# Patient Record
Sex: Female | Born: 1945 | Race: Black or African American | Hispanic: No
Health system: Midwestern US, Community
[De-identification: ages and names within clinical notes are randomized; demographics above are authoritative.]

## PROBLEM LIST (undated history)

## (undated) DIAGNOSIS — E785 Hyperlipidemia, unspecified: Secondary | ICD-10-CM

## (undated) DIAGNOSIS — Z800 Family history of malignant neoplasm of digestive organs: Secondary | ICD-10-CM

## (undated) DIAGNOSIS — K219 Gastro-esophageal reflux disease without esophagitis: Secondary | ICD-10-CM

## (undated) DIAGNOSIS — C50919 Malignant neoplasm of unspecified site of unspecified female breast: Secondary | ICD-10-CM

## (undated) DIAGNOSIS — D709 Neutropenia, unspecified: Secondary | ICD-10-CM

## (undated) DIAGNOSIS — C801 Malignant (primary) neoplasm, unspecified: Secondary | ICD-10-CM

## (undated) DIAGNOSIS — J302 Other seasonal allergic rhinitis: Secondary | ICD-10-CM

## (undated) DIAGNOSIS — T7840XA Allergy, unspecified, initial encounter: Secondary | ICD-10-CM

## (undated) DIAGNOSIS — D72819 Decreased white blood cell count, unspecified: Secondary | ICD-10-CM

## (undated) DIAGNOSIS — Z803 Family history of malignant neoplasm of breast: Secondary | ICD-10-CM

## (undated) DIAGNOSIS — J189 Pneumonia, unspecified organism: Secondary | ICD-10-CM

## (undated) DIAGNOSIS — I10 Essential (primary) hypertension: Secondary | ICD-10-CM

## (undated) DIAGNOSIS — Z780 Asymptomatic menopausal state: Secondary | ICD-10-CM

## (undated) DIAGNOSIS — R519 Headache, unspecified: Secondary | ICD-10-CM

## (undated) DIAGNOSIS — R079 Chest pain, unspecified: Secondary | ICD-10-CM

## (undated) DIAGNOSIS — H9042 Sensorineural hearing loss, unilateral, left ear, with unrestricted hearing on the contralateral side: Secondary | ICD-10-CM

## (undated) DIAGNOSIS — Z13820 Encounter for screening for osteoporosis: Secondary | ICD-10-CM

## (undated) DIAGNOSIS — Z01419 Encounter for gynecological examination (general) (routine) without abnormal findings: Secondary | ICD-10-CM

## (undated) DIAGNOSIS — R0602 Shortness of breath: Secondary | ICD-10-CM

## (undated) HISTORY — DX: Allergy, unspecified, initial encounter: T78.40XA

## (undated) HISTORY — PX: AUGMENTATION MAMMAPLASTY: SUR837

## (undated) HISTORY — DX: Essential (primary) hypertension: I10

## (undated) HISTORY — DX: Hyperlipidemia, unspecified: E78.5

## (undated) HISTORY — DX: Family history of malignant neoplasm of breast: Z80.3

## (undated) HISTORY — PX: COLONOSCOPY: SHX174

## (undated) HISTORY — DX: Gastro-esophageal reflux disease without esophagitis: K21.9

## (undated) HISTORY — PX: POLYPECTOMY: SHX149

## (undated) HISTORY — DX: Other seasonal allergic rhinitis: J30.2

## (undated) HISTORY — DX: Family history of malignant neoplasm of digestive organs: Z80.0

---

## 1971-12-06 HISTORY — PX: TUBAL LIGATION: SHX77

## 1979-12-06 HISTORY — PX: OTHER SURGICAL HISTORY: SHX169
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
## 2000-08-28 NOTE — Procedures (Signed)
[facility]                          STRESS ECHOCARDIOGRAM REPORT   NAME:    Kirsten Day, Kirsten Day                           SS#:        [id number]   60   DOB:     03/03/1946                                   AGE:        53   SEX:     F                                            ROOM#:      [id number]   MR#:     [id number]                                     DATE:       09/25/200   1   REFERRING PHYS:K. FrumkinTAPE/INDEX:                  [id number]/6670   PRETEST DATA:   INDICATION:  Chest pain   MEDS TAKEN:  --   MEDS HELD:  --   TARGET HEART RATE:  167     85%:  141   RISK FACTORS: --   BASELINE ECG:  Normal sinus rhythm; poor R wave progression   EXERCISE SUPERVISED BY:   TEST RESULTS:           BRUCE PROTOCOL     STAGE    SPEED (MPH)   GRADE (%)   TIME (MIN:SEC)      HR       BP   Resting                                                  74     104/72       1          1.7           10           3:00          102     126/76       2          2.5           12           3:00          130     162/82       3          3.4           14           1:00          153     ---/--    Recovery                                 Immediate        --     ---/--                                             2:00           74     156/76                                             4:00           74     142/72                                             6:00           69     130/80   REASON FOR STOPPING:  Achieved target HR        TOTAL EXERCISE TIME:  7:00   ACHIEVED HEART RATE:  153 (92%  max HR)         EST. METS:  8.5   HR RESPONSE:  Normal                            PEAK RPP:  24,786   BP RESPONSE:  Normal                            CHEST PAIN:  None   OBSERVED DYSRHYTHMIAS:  PAC   ST SEGMENT CHANGES:  J point depression with insignificant rapidly   upsloping ST segments                               WALL MOTION ANALYSIS   LV WALL SEGMENT              PRE-EXERCISE            POST-EXERCISE    Basal Anteroseptal              Normal                Hyperkinesis   Basal Septal                    Normal                Hyperkinesis   Basal Inferoseptal              Normal                Hyperkinesis   Basal Posterior                 Normal                Hyperkinesis   Basal Lateral                   Normal                Hyperkinesis   Basal Anterior                    Normal                Hyperkinesis   Mid Anteroseptal                Normal                Hyperkinesis   Mid Septal                      Normal                Hyperkinesis   Mid Inferoseptal                Normal                Hyperkinesis   Mid Posterior                   Normal                Hyperkinesis   Mid Lateral                     Normal                Hyperkinesis   Mid Anterior                    Normal                Hyperkinesis   Apical Septal                   Normal                Hyperkinesis   Apical Inferior                 Normal                Hyperkinesis   Apical Lateral                  Normal                Hyperkinesis   Apical Anterior                 Normal                Hyperkinesis   LV  Chamber Size                                        Smaller   ECG INTERPRETATION:  Negative by ECG criteria.   ECHO INTERPRETATION:  Normal.   OVERALL IMPRESSION:  Negative for ischemia.

## 2000-08-28 NOTE — ED Provider Notes (Signed)
[facility]                      EMERGENCY DEPARTMENT TREATMENT REPORT   NAME:  NETTY, SULLIVANT   MR #:  [id number]   BILLING #: [id number]        DOS: 08/28/2000  TIME: 2:46 P   cc:   Primary Physician:   CHIEF COMPLAINT:   Chest pain.   HISTORY OF PRESENT ILLNESS:  [age]-year-old woman has had mid sternal   chest tightness on and off for about a week.  It is occasionally worse with   certain positions, occasionally worse supine, it is not particularly   exertional.  It is mid sternal and radiates to both sides of her chest,   occasionally goes to her lower abdomen.  There is some shortness of breath   with it but no diaphoresis, it is not worse when she breathes or moves.  It   is similar to her reflux that she has had in the past.   REVIEW OF SYSTEMS:   CONSTITUTIONAL:  No fever, chills, or weight loss.  She has had some   exertional fatigue.   ENT:  She also has some tightness in her throat with this.   RESPIRATORY:  Above.   CARDIOVASCULAR:    Above.   GASTROINTESTINAL:   Has had some diarrhea.   GENITOURINARY:  No dysuria, frequency, or urgency.   MUSCULOSKELETAL:   No long trips, no leg pain.   Denies complaints in any other system.   PAST MEDICAL HISTORY:   She has reflux, she had a stress test about three   years ago which was for similar symptoms which was negative.   FAMILY HISTORY:  Negative for coronary disease, negative for premature   coronary disease, positive for coronary disease.   SOCIAL HISTORY:  Negative for current tobacco or alcohol use.   ALLERGIES:  None.   MEDICATIONS:   Claritin, Prilosec and birth control pills.   PHYSICAL EXAMINATION:  153/83, pulse 71, respirations 20, temperature 99.1.   GENERAL:  Thin, healthy appearing 54 year old woman in no apparent   distress.   HEENT:  Pupils equal and reactive.  Extra-ocular movements normal. Nose and   throat unremarkable.   NECK:  Supple, no significant cervical lymphadenopathy, no JVD.    LUNGS:  Clear and equal BS, no respiratory distress or tachypnea.   HEART:  Regular, without significant murmurs, gallops or rubs.   ABDOMEN:  Soft, nontender, without complaint of pain to palpation.   EXTREMITIES:  Calves soft and nontender.  No peripheral edema. Pulses   satisfactory.   NEUROLOGICAL:  Cranial nerves, deep tendon reflexes, strength and light   touch sensation WNL.   CHEST WALL:   Non tender.   IMPRESSION/PLAN:  Patient with chest pain.  Acute ischemic coronary disease   must be considered first, and the patient protected against the   consequences of same, while other etiologies (including infectious,   metabolic, pulmonary, gastrointestinal, and musculoskeletal) are   considered.   COURSE IN EMERGENCY DEPARTMENT:  Improved after a total of two sublingual   nitroglycerin  and the application of an inch of paste.  She still had some   discomfort left.   DIAGNOSTIC EVALUATION:   Cardiac enzymes were normal including a CPK of 60,   an MB of 0.3, troponin is 0, myoglobin normal at 29.4.  CMP unremarkable,   albumin a little low at 3.5.  Electrocardiogram nonspecific ST-T wave  changes. White count 3,000, hemoglobin 14, platelets 260,000.  Chest x-ray   no acute disease by the radiologist.   DIAGNOSTIC IMPRESSION:   1.  Acute chest pain:  Unstable angina versus myocardial infarction versus      GI in origin.   DISPOSITION:   The initial Emergency Department evaluation of this patient   appears to be negative for evidence of an acute, ongoing coronary ischemia   requiring hospital admission or urgent intervention.  However, ischemic   coronary disease has not been eliminated as a consideration.  Consequently,   the patient will be assigned to the Outpatient Center for serial cardiac   enzymes and, if these are negative, resting and stress echocardiography or   other additional diagnostic testing.  Discussed with Dr. Bertram Gala who is   covering for Dr. Martie Round who concurs.   Electronically Signed By:    Shanna Cisco, M.D. 08/28/2000 18:17   ____________________________   Shanna Cisco, M.D.   Xaver.Mink D:  08/28/2000 T:  08/28/2000  2:47 P   [id number]

## 2000-08-28 NOTE — Discharge Summary (Signed)
[facility]                       OUTPATIENT CENTER DISCHARGE SUMMARY   NAME:Day, Kirsten   MR#:  [id number]     BILLING #: [id number]     DOS: 08/28/2000  DOD:08/29/2000   TIME: 11:24 A   cc:   Primary Physician:   Arlester Marker, M.D.   DATE OF ASSIGNMENT:  1525 hours on 08/28/00.   DATE OF DISCHARGE:  1130 hours on 08/29/00.   ADMISSION DIAGNOSIS:   1.  Acute chest pain, unstable angina versus myocardial infarction versus      gastrointestinal.   DISCHARGE DIAGNOSIS:   1.  Acute chest pain, cardiac unlikely, gastrointestinal more likely.   HISTORY OF PRESENT ILLNESS:  [age]-year-old woman with known GERD   with midsternal chest discomfort on and off for about a week, tightness in   nature.  Seen by me in the Emergency Department.  The Emergency Department   evaluation was unrevealing and the patient was assigned to the outpatient   center for serial cardiac enzymes and stress echocardiography.   COURSE IN THE OUTPATIENT CENTER:  The patient remained comfortable, vital   signs normal.  The patient was assigned to the Outpatient Center for serial   cardiac enzymes and resting and stress echocardiography.  Results of serum   myoglobin at 0 and 3 hours, and serum CPK and MB at 0, 6, and 9 hours gave   no indication of acute myocardial damage.  The patient then underwent   resting and exercise 2-D echocardiography and exercise test under the   supervision of [facility].  Their final impression was of   "No CAD, OK for Discharge."   PHYSICAL EXAMINATION:   Examination at discharge.   GENERAL:  Asymptomatic.   LUNGS:  Clear.   HEART:  Normal.   ABDOMEN:  Nontender.   DISPOSITION:  The probabilistic nature of cardiac diagnostic testing was   explained.  The patient was counseled to seek further cardiac evaluation   should symptoms worsen or persist without another diagnosis being found.   Advised liquid antacids, prescription Carafate #36 with one refill,  follow   up her physician for continued evaluation.  The patient is discharged with   verbal and written   instructions and a referral for ongoing care.  The patient is aware that   they may return at any time for new or worsening symptoms.   Electronically Signed By:   Shanna Cisco, M.D. 09/02/2000 04:13   ____________________________   Shanna Cisco, M.D.   cd D:  08/29/2000 T:  09/01/2000  3:32 P   [id number]

## 2000-08-28 NOTE — Procedures (Signed)
[facility]                          STRESS ECHOCARDIOGRAM REPORT   NAME:    Kirsten Day, Kirsten Day                           SS#:        [id number]   60   DOB:     [birth year]                                   AGE:        53   SEX:     F                                            ROOM#:      [id number]   MR#:     [id number]                                     DATE:       09/25/200   1   REFERRING PHYS:K. FrumkinTAPE/INDEX:                  [id number]   PRETEST DATA:   INDICATION:  Chest pain   MEDS TAKEN:  --   MEDS HELD:  --   TARGET HEART RATE:  167     85%:  141   RISK FACTORS: --   BASELINE ECG:  Normal sinus rhythm; poor R wave progression   EXERCISE SUPERVISED BY:   TEST RESULTS:           BRUCE PROTOCOL     STAGE    SPEED (MPH)   GRADE (%)   TIME (MIN:SEC)      HR       BP   Resting                                                  74     104/72       1          1.7           10           3:00          102     126/76       2          2.5           12           3:00          130     162/82       3          3.4           14           1:00          153     ---/--    Recovery  Immediate        --     ---/--                                             2:00           74     156/76                                             4:00           74     142/72                                             6:00           69     130/80   REASON FOR STOPPING:  Achieved target HR        TOTAL EXERCISE TIME:  7:00   ACHIEVED HEART RATE:  153 (92%  max HR)         EST. METS:  8.5   HR RESPONSE:  Normal                            PEAK RPP:  24,786   BP RESPONSE:  Normal                            CHEST PAIN:  None   OBSERVED DYSRHYTHMIAS:  PAC   ST SEGMENT CHANGES:  J point depression with insignificant rapidly   upsloping ST segments                               WALL MOTION ANALYSIS   LV WALL SEGMENT              PRE-EXERCISE            POST-EXERCISE    Basal Anteroseptal              Normal                Hyperkinesis   Basal Septal                    Normal                Hyperkinesis   Basal Inferoseptal              Normal                Hyperkinesis   Basal Posterior                 Normal                Hyperkinesis   Basal Lateral                   Normal                Hyperkinesis   Basal Anterior  Normal                Hyperkinesis   Mid Anteroseptal                Normal                Hyperkinesis   Mid Septal                      Normal                Hyperkinesis   Mid Inferoseptal                Normal                Hyperkinesis   Mid Posterior                   Normal                Hyperkinesis   Mid Lateral                     Normal                Hyperkinesis   Mid Anterior                    Normal                Hyperkinesis   Apical Septal                   Normal                Hyperkinesis   Apical Inferior                 Normal                Hyperkinesis   Apical Lateral                  Normal                Hyperkinesis   Apical Anterior                 Normal                Hyperkinesis   LV  Chamber Size                                        Smaller   ECG INTERPRETATION:  Negative by ECG criteria.   ECHO INTERPRETATION:  Normal.   OVERALL IMPRESSION:  Negative for ischemia.

## 2000-08-30 NOTE — Procedures (Signed)
[facility]                          STRESS ECHOCARDIOGRAM REPORT   NAME:    Kirsten Day, Kirsten Day                           SS#:        [id number]   60   DOB:     [birth year]                                   AGE:        53   SEX:     F                                            ROOM#:      [id number]   MR#:     [id number]                                     DATE:       09/25/200   1   REFERRING PHYS:K. FrumkinTAPE/INDEX:                  150/6670   PRETEST DATA:   INDICATION:  Chest pain   MEDS TAKEN:  --   MEDS HELD:  --   TARGET HEART RATE:  167     85%:  141   RISK FACTORS: --   BASELINE ECG:  Normal sinus rhythm; poor R wave progression   EXERCISE SUPERVISED BY:   TEST RESULTS:           BRUCE PROTOCOL     STAGE    SPEED (MPH)   GRADE (%)   TIME (MIN:SEC)      HR       BP   Resting                                                  74     104/72       1          1.7           10           3:00          102     126/76       2          2.5           12           3:00          130     162/82       3          3.4           14           1:00          153     ---/--    Recovery  Immediate        --     ---/--                                             2:00           74     156/76                                             4:00           74     142/72                                             6:00           69     130/80   REASON FOR STOPPING:  Achieved target HR        TOTAL EXERCISE TIME:  7:00   ACHIEVED HEART RATE:  153 (92%  max HR)         EST. METS:  8.5   HR RESPONSE:  Normal                            PEAK RPP:  24,786   BP RESPONSE:  Normal                            CHEST PAIN:  None   OBSERVED DYSRHYTHMIAS:  PAC   ST SEGMENT CHANGES:  J point depression with insignificant rapidly   upsloping ST segments                               WALL MOTION ANALYSIS   LV WALL SEGMENT              PRE-EXERCISE            POST-EXERCISE   Basal Anteroseptal               Normal                Hyperkinesis   Basal Septal                    Normal                Hyperkinesis   Basal Inferoseptal              Normal                Hyperkinesis   Basal Posterior                 Normal                Hyperkinesis   Basal Lateral                   Normal                Hyperkinesis   Basal Anterior  Normal                Hyperkinesis   Mid Anteroseptal                Normal                Hyperkinesis   Mid Septal                      Normal                Hyperkinesis   Mid Inferoseptal                Normal                Hyperkinesis   Mid Posterior                   Normal                Hyperkinesis   Mid Lateral                     Normal                Hyperkinesis   Mid Anterior                    Normal                Hyperkinesis   Apical Septal                   Normal                Hyperkinesis   Apical Inferior                 Normal                Hyperkinesis   Apical Lateral                  Normal                Hyperkinesis   Apical Anterior                 Normal                Hyperkinesis   LV  Chamber Size                                        Smaller   ECG INTERPRETATION:  Negative by ECG criteria.   ECHO INTERPRETATION:  Normal.   OVERALL IMPRESSION:  Negative for ischemia.

## 2000-08-30 NOTE — Procedures (Signed)
[facility]                          STRESS ECHOCARDIOGRAM REPORT   NAME:    Kirsten Day, Kirsten Day                           SS#:        [id number]   60   DOB:     12/10/1945                                   AGE:        53   SEX:     F                                            ROOM#:      [id number]   MR#:     [id number]                                     DATE:       09/25/200   1   REFERRING PHYS:K. FrumkinTAPE/INDEX:                  150/6670   PRETEST DATA:   INDICATION:  Chest pain   MEDS TAKEN:  --   MEDS HELD:  --   TARGET HEART RATE:  167     85%:  141   RISK FACTORS: --   BASELINE ECG:  Normal sinus rhythm; poor R wave progression   EXERCISE SUPERVISED BY:   TEST RESULTS:           BRUCE PROTOCOL     STAGE    SPEED (MPH)   GRADE (%)   TIME (MIN:SEC)      HR       BP   Resting                                                  74     104/72       1          1.7           10           3:00          102     126/76       2          2.5           12           3:00          130     162/82       3          3.4           14           1:00          153     ---/--    Recovery                                 Immediate        --     ---/--                                             2:00           74     156/76                                             4:00           74     142/72                                             6:00           69     130/80   REASON FOR STOPPING:  Achieved target HR        TOTAL EXERCISE TIME:  7:00   ACHIEVED HEART RATE:  153 (92%  max HR)         EST. METS:  8.5   HR RESPONSE:  Normal                            PEAK RPP:  24,786   BP RESPONSE:  Normal                            CHEST PAIN:  None   OBSERVED DYSRHYTHMIAS:  PAC   ST SEGMENT CHANGES:  J point depression with insignificant rapidly   upsloping ST segments                               WALL MOTION ANALYSIS   LV WALL SEGMENT              PRE-EXERCISE            POST-EXERCISE   Basal Anteroseptal               Normal                Hyperkinesis   Basal Septal                    Normal                Hyperkinesis   Basal Inferoseptal              Normal                Hyperkinesis   Basal Posterior                 Normal                Hyperkinesis   Basal Lateral                   Normal                Hyperkinesis   Basal Anterior                    Normal                Hyperkinesis   Mid Anteroseptal                Normal                Hyperkinesis   Mid Septal                      Normal                Hyperkinesis   Mid Inferoseptal                Normal                Hyperkinesis   Mid Posterior                   Normal                Hyperkinesis   Mid Lateral                     Normal                Hyperkinesis   Mid Anterior                    Normal                Hyperkinesis   Apical Septal                   Normal                Hyperkinesis   Apical Inferior                 Normal                Hyperkinesis   Apical Lateral                  Normal                Hyperkinesis   Apical Anterior                 Normal                Hyperkinesis   LV  Chamber Size                                        Smaller   ECG INTERPRETATION:  Negative by ECG criteria.   ECHO INTERPRETATION:  Normal.   OVERALL IMPRESSION:  Negative for ischemia.

## 2002-10-25 NOTE — ED Provider Notes (Signed)
[facility]                      EMERGENCY DEPARTMENT TREATMENT REPORT   NAME:  Kirsten [name]                   PT. LOCATION:     ERO EO09   MR #:         BILLING #: [id number]          DOS: 10/25/2002   TIME:11:10 A   [id number]   cc:    Fenton Foy, M.D.   Primary Physician:   Date/Time of Assignment:   10/26/02 at 0110 hours.   Date/Time of Discharge:     10/26/02 at 1120 hours.   ADMITTING DIAGNOSIS:  Precordial pain.   DISCHARGE DIAGNOSIS:  Atypical precordial pain, negative per cardiac   echocardiogram stress test, suspect probable gastrointestinal reflux   etiology.   COURSE IN THE OBSERVATION UNIT:  The patient was assigned to the Emergency   Department Observation Unit for serial cardiac enzymes and resting and   stress echocardiography.  Results of serum myoglobin at 0 and 3 hours, and   serum CPK and MB at 0, 6, and 9 hours gave no indication of acute   myocardial damage.  The patient then underwent resting and exercise 2-D   echocardiography and exercise test under the supervision of [facility].  Their final impression was of "No CAD, OK for Discharge."   Speaking with the patient further, she states this does feel similar to her   acid reflux but she felt more severe which brought her for evaluation.  She   has been on AcipHex but she feels like it is not helping so we are going   switch her to Prevacid.  While here her vital signs have been stable, her   telemetry strips have been normal and she feels a little discomfort in her   abdomen but is chest pain free.  The implications of these studies have   been explained to her at length and she understands that if she has further   problems she can return immediately to the ER, otherwise she needs to   follow up  with Dr. Martie Round for recheck.   PHYSICAL EXAMINATION:   VITAL SIGNS:   Temperature 96.5, blood pressure 115/64, pulse 77,   respirations 18, pulse oximetry 97%.    GENERAL DESCRIPTION:   Well-developed African-American woman, well   hydrated, nontoxic in appearance.   LUNGS:   Clear and equal.   HEART:   Regular rhythm.   ABDOMEN:   Soft, nontender.   DISPOSITION/PLAN: Home.  Prescription for Prevacid, follow up with Dr.   Martie Round in the next 3 days and return to the ER for worsening pain and as   needed.   Electronically Signed By:   Jerilynn Som, M.D. 11/27/2002 18:11   ____________________________   Jerilynn Som, M.D.   jdm  D:  10/26/2002  T:  10/26/2002  7:45 P   [id number]

## 2002-10-25 NOTE — Procedures (Signed)
[facility]                          STRESS ECHOCARDIOGRAM REPORT   NAME:   Kirsten [name], Kirsten [name]                             SS#:   [id number]   DOB:     Aug 22, 1946                                     AGE:        55   SEX:     F                                           ROOM#:     ER   MR#:    [id number]                                       DATE:   10/26/2002   REFERRING PHYS:   Roselie Skinner                               TAPE/INDEX:   226/660   PRETEST DATA:   INDICATION:  Chest pain   MEDS TAKEN:  --   MEDS HELD:  Aciphex, Claritin   TARGET HEART RATE:  165     85%:  140   RISK FACTORS:  --   BASELINE ECG:  Normal sinus rhythm; within normal limits   EXERCISE SUPERVISED BY: --   TEST RESULTS:             BRUCE PROTOCOL    STAGE    SPEED (MPH)   GRADE (%)    TIME (MIN:SEC)     HR       BP   Resting                                                  84     101/53      1          1.7           10            3:00         103     110/58      2          2.5           12            3:00         125     140/70      3          3.4           14            3:00         164       --  4          4.2           16            :29           --       --   Recovery                               Immediate        --       --                                             2:00          96     138/60                                             4:00          85     130/58                                             6:00          --     126/60   REASON FOR STOPPING:  Achieved target HR                  TOTAL EXERCISE   TIME:  9:29   ACHIEVED HEART RATE:  164 (99%  max HR)                   EST. METS:  --   HR RESPONSE:  Normal                                      PEAK RPP:   BP RESPONSE:  Normal                                      CHEST PAIN:   None   OBSERVED DYSRHYTHMIAS:  None   ST SEGMENT CHANGES:  None                                 WALL MOTION ANALYSIS    LV WALL SEGMENT             PRE-EXERCISE             POST-EXERCISE   Basal Anteroseptal             Normal                 Hyperkinesis   Basal Septal                   Normal                 Hyperkinesis   Basal Inferoseptal  Normal                 Hyperkinesis   Basal Posterior                Normal                 Hyperkinesis   Basal Lateral                  Normal                 Hyperkinesis   Basal Anterior                 Normal                 Hyperkinesis   Mid Anteroseptal               Normal                 Hyperkinesis   Mid Septal                     Normal                 Hyperkinesis   Mid Inferoseptal               Normal                 Hyperkinesis   Mid Posterior                  Normal                 Hyperkinesis   Mid Lateral                    Normal                 Hyperkinesis   Mid Anterior                   Normal                 Hyperkinesis   Apical Septal                  Normal                 Hyperkinesis   Apical Inferior                Normal                 Hyperkinesis   Apical Lateral                 Normal                 Hyperkinesis   Apical Anterior                Normal                 Hyperkinesis   LV  Chamber Size                                        Smaller   ECG INTERPRETATION:  Negative by ECG criteria.   ECHO INTERPRETATION:  Normal.   OVERALL IMPRESSION:  Negative for ischemia.   ECG AND ECHO INTERPRETATION BY :  Harland German., M.D.   tb  D: 10/26/2002  T: 10/27/2002  2:57 A    wks

## 2002-10-25 NOTE — Procedures (Signed)
[facility]                          STRESS ECHOCARDIOGRAM REPORT   NAME:   [name], Kirsten                             SS#:   [id number]   DOB:     10/17/1946                                     AGE:        55   SEX:     F                                           ROOM#:     ER   MR#:    [id number]                                       DATE:   10/26/2002   REFERRING PHYS:   J. Steckler                               TAPE/INDEX:   226/660   PRETEST DATA:   INDICATION:  Chest pain   MEDS TAKEN:  --   MEDS HELD:  Aciphex, Claritin   TARGET HEART RATE:  165     85%:  140   RISK FACTORS:  --   BASELINE ECG:  Normal sinus rhythm; within normal limits   EXERCISE SUPERVISED BY: --   TEST RESULTS:             BRUCE PROTOCOL    STAGE    SPEED (MPH)   GRADE (%)    TIME (MIN:SEC)     HR       BP   Resting                                                  84     101/53      1          1.7           10            3:00         103     110/58      2          2.5           12            3:00         125     140/70      3          3.4           14            3:00         164       --        4          4.2           16            :29           --       --   Recovery                               Immediate        --       --                                             2:00          96     138/60                                             4:00          85     130/58                                             6:00          --     126/60   REASON FOR STOPPING:  Achieved target HR                  TOTAL EXERCISE   TIME:  9:29   ACHIEVED HEART RATE:  164 (99%  max HR)                   EST. METS:  --   HR RESPONSE:  Normal                                      PEAK RPP:   BP RESPONSE:  Normal                                      CHEST PAIN:   None   OBSERVED DYSRHYTHMIAS:  None   ST SEGMENT CHANGES:  None                                 WALL MOTION ANALYSIS    LV WALL SEGMENT             PRE-EXERCISE             POST-EXERCISE   Basal Anteroseptal             Normal                 Hyperkinesis   Basal Septal                   Normal                 Hyperkinesis   Basal Inferoseptal               Normal                 Hyperkinesis   Basal Posterior                Normal                 Hyperkinesis   Basal Lateral                  Normal                 Hyperkinesis   Basal Anterior                 Normal                 Hyperkinesis   Mid Anteroseptal               Normal                 Hyperkinesis   Mid Septal                     Normal                 Hyperkinesis   Mid Inferoseptal               Normal                 Hyperkinesis   Mid Posterior                  Normal                 Hyperkinesis   Mid Lateral                    Normal                 Hyperkinesis   Mid Anterior                   Normal                 Hyperkinesis   Apical Septal                  Normal                 Hyperkinesis   Apical Inferior                Normal                 Hyperkinesis   Apical Lateral                 Normal                 Hyperkinesis   Apical Anterior                Normal                 Hyperkinesis   LV  Chamber Size                                        Smaller   ECG INTERPRETATION:  Negative by ECG criteria.   ECHO INTERPRETATION:  Normal.   OVERALL IMPRESSION:  Negative for ischemia.   ECG AND ECHO INTERPRETATION BY :                                            CHARLES C. ASHBY, JR., M.D.   tb  D: 10/26/2002  T: 10/27/2002  2:57 A    wks

## 2002-10-25 NOTE — ED Provider Notes (Signed)
[facility]                      EMERGENCY DEPARTMENT TREATMENT REPORT   OBSERVATION UNIT   NAME:  Kirsten [name]                   PT. LOCATION:     ERO EO09   MR #:         BILLING #: [id number]          DOS: 10/25/2002   TIME:12:43 A   34-34-72   cc:    Fenton Foy, M.D.   Primary Physician:   Fenton Foy, M.D.   CHIEF COMPLAINT:   Chest pain.   HISTORY OF PRESENT ILLNESS:  The patient is a 56 year old female who   complains of a tight feeling underneath her breast bone for the past 3-4   days.  She states that the pain is constant and that nothing really makes   it better or worse.  She states that the pain reached a 7/10 when it was at   its worst and was a 3/10 when she arrived in the emergency department.  The   patient also went to [facility] where she had an EKG, chest x-ray, CBC,   and then was sent to the emergency department for further evaluation.  She   has also experienced some associated left arm numbness and tingling   sensations on and off over the past few days.  She had an extremely similar   episode 2 years ago, was admitted to the ED observation unit, had normal   cardiac enzymes and a negative stress echocardiogram.   REVIEW OF SYSTEMS   CONSTITUTIONAL:  No fever, chills, weight loss.   ENT: No sore throat, runny nose or other URI symptoms.   RESPIRATORY: Positive for slight shortness of breath and no coughing, no   wheezing.   CARDIOVASCULAR:  No chest pain, chest pressure, or palpitations.   GASTROINTESTINAL:  No vomiting, diarrhea, or abdominal pain.   GENITOURINARY:  No dysuria, frequency, or urgency.   MUSCULOSKELETAL:  Positive for chronic neck and back pain.   NEUROLOGICAL:  She has no headache, no sensory or motor symptoms, but has   been feeling a little lightheaded.   PAST MEDICAL HISTORY:  Positive for gastroesophageal reflux disease and   high cholesterol.   PAST SURGICAL HISTORY:  Positive for bilateral tubal ligation and an    appendectomy.   FAMILY HISTORY:  Positive for heart disease in her mother and an assortment   of things in her grandparents including diabetes, lung cancer, colon cancer   and Alzheimer's.   SOCIAL HISTORY: The patient does not smoke, drink alcohol or use any   illegal drugs.   ALLERGIES: No known drug allergies.   MEDICATIONS:  AcipHex.   PHYSICAL EXAMINATION:   VITAL SIGNS: Blood pressure 123/60, pulse 66, respiration 19, temperature   96.71F.  Oxygen saturation of 99% on room air.   GENERAL APPEARANCE:  The patient appears well developed and well nourished.   Appearance and behavior are age and situation appropriate.   HEENT: Mouth/Throat:  Surfaces of the pharynx, palate, and tongue are pink,   moist, and without lesions.   NECK:  Supple and nontender.   RESPIRATORY:  Clear and equal breath sounds.  No respiratory distress,   tachypnea, or accessory muscle use.   CARDIOVASCULAR:  Heart regular, without murmurs, gallops, rubs, or thrills.  PMI not displaced.   GI:  Abdomen soft, nontender, without complaint of pain to palpation.  No   hepatomegaly or splenomegaly.   MUSCULOSKELETAL:  No edema, good pulses bilaterally.   NEUROLOGICAL:  No focal deficits.   Glasgow Coma Scale = 15.   INITIAL ASSESSMENT AND MANAGEMENT PLAN:  A patient with chest pain.  Acute   ischemic coronary disease must be considered first, and the patient   protected against the consequences of same, while other etiologies   (including infectious, metabolic, pulmonary, gastrointestinal, and   musculoskeletal) are considered.  The patient was given nitroglycerin 1:150   sublingual because she was still having chest pain at 3/10 upon her   arrival.  She stated that that brought her pain down to a 1/10.   Nitro-paste 1 inch to her anterior chest wall was also given.  The patient   already received aspirin and 2 nitroglycerin at [facility].   On reevaluation at 2330, the patient is sleeping.  When aroused the patient    states she is feeling well and is currently experiencing no chest pain.   DIAGNOSTIC STUDIES:   EKG shows a normal sinus rhythm, rate of 62 with no   acute nor ischemic changes.  PR and QT intervals are normal.  Cardiac   enzymes are still pending.   CONTINUATION BY DR. FRUMKIN:   Asked to check cardiac enzymes by Dr. Lonny Prude.   DIAGNOSTIC TESTING:  Cardiac enzymes normal.   MY EXAM:  The patient is comfortable.   LUNGS:  Clear.   HEART:  Normal.  She has been pain-free since nitroglycerin and   nitroglycerin past.  EKG is unchanged.   IMPRESSION:  Acute precordial pain, etiology unclear.  Angina versus   gastrointestinal.   DISPOSITION:  The initial emergency department evaluation of this patient   appears to be negative for evidence of an acute coronary ischemia requiring   hospital admission or urgent intervention.  However, ischemic coronary   disease has not been eliminated as a consideration.  Consequently, the   patient will be assigned to the Emergency Department Observation Unit for   serial cardiac enzymes and, if these are negative, resting and stress   echocardiography or other additional diagnostic testing.  At the time of   dictation, consulting Dr. Martie Round, reference her concurrence.   Electronically Signed By:   Elbert Ewings, D.O. 11/05/2002 09:41   ____________________________   Elbert Ewings, D.O.   gm/rew  D:  10/26/2002  T:  10/26/2002  6:28 A   [id number]   Shanna Cisco, M.D.

## 2002-10-26 NOTE — Procedures (Signed)
[facility]                          STRESS ECHOCARDIOGRAM REPORT   NAME:   Kirsten Day, Kirsten Day                             SS#:   [id number]   DOB:     09/05/1946                                     AGE:        55   SEX:     F                                           ROOM#:     ER   MR#:    [id number]                                       DATE:   10/26/2002   REFERRING PHYS:   J. Steckler                               TAPE/INDEX:   226/660   PRETEST DATA:   INDICATION:  Chest pain   MEDS TAKEN:  --   MEDS HELD:  Aciphex, Claritin   TARGET HEART RATE:  165     85%:  140   RISK FACTORS:  --   BASELINE ECG:  Normal sinus rhythm; within normal limits   EXERCISE SUPERVISED BY: --   TEST RESULTS:             BRUCE PROTOCOL    STAGE    SPEED (MPH)   GRADE (%)    TIME (MIN:SEC)     HR       BP   Resting                                                  84     101/53      1          1.7           10            3:00         103     110/58      2          2.5           12            3:00         125     140/70      3          3.4           14            3:00         164       --        4          4.2           16            :29           --       --   Recovery                               Immediate        --       --                                             2:00          96     138/60                                             4:00          85     130/58                                             6:00          --     126/60   REASON FOR STOPPING:  Achieved target HR                  TOTAL EXERCISE   TIME:  9:29   ACHIEVED HEART RATE:  164 (99%  max HR)                   EST. METS:  --   HR RESPONSE:  Normal                                      PEAK RPP:   BP RESPONSE:  Normal                                      CHEST PAIN:   None   OBSERVED DYSRHYTHMIAS:  None   ST SEGMENT CHANGES:  None                                 WALL MOTION ANALYSIS   LV WALL SEGMENT             PRE-EXERCISE              POST-EXERCISE   Basal Anteroseptal             Normal                 Hyperkinesis   Basal Septal                   Normal                 Hyperkinesis   Basal Inferoseptal               Normal                 Hyperkinesis   Basal Posterior                Normal                 Hyperkinesis   Basal Lateral                  Normal                 Hyperkinesis   Basal Anterior                 Normal                 Hyperkinesis   Mid Anteroseptal               Normal                 Hyperkinesis   Mid Septal                     Normal                 Hyperkinesis   Mid Inferoseptal               Normal                 Hyperkinesis   Mid Posterior                  Normal                 Hyperkinesis   Mid Lateral                    Normal                 Hyperkinesis   Mid Anterior                   Normal                 Hyperkinesis   Apical Septal                  Normal                 Hyperkinesis   Apical Inferior                Normal                 Hyperkinesis   Apical Lateral                 Normal                 Hyperkinesis   Apical Anterior                Normal                 Hyperkinesis   LV  Chamber Size                                        Smaller   ECG INTERPRETATION:  Negative by ECG criteria.   ECHO INTERPRETATION:  Normal.   OVERALL IMPRESSION:  Negative for ischemia.   ECG AND ECHO INTERPRETATION BY :                                            CHARLES C. ASHBY, JR., M.D.   tb  D: 10/26/2002  T: 10/27/2002  2:57 A    wks

## 2002-10-26 NOTE — Procedures (Signed)
[facility]                          STRESS ECHOCARDIOGRAM REPORT   NAME:   Kirsten Day, Kirsten Day                             SS#:   [id number]   DOB:     1945-12-21                                     AGE:        55   SEX:     F                                           ROOM#:     ER   MR#:    [id number]                                       DATE:   10/26/2002   REFERRING PHYS:   Roselie Skinner                               TAPE/INDEX:   226/660   PRETEST DATA:   INDICATION:  Chest pain   MEDS TAKEN:  --   MEDS HELD:  Aciphex, Claritin   TARGET HEART RATE:  165     85%:  140   RISK FACTORS:  --   BASELINE ECG:  Normal sinus rhythm; within normal limits   EXERCISE SUPERVISED BY: --   TEST RESULTS:             BRUCE PROTOCOL    STAGE    SPEED (MPH)   GRADE (%)    TIME (MIN:SEC)     HR       BP   Resting                                                  84     101/53      1          1.7           10            3:00         103     110/58      2          2.5           12            3:00         125     140/70      3          3.4           14            3:00         164       --  4          4.2           16            :29           --       --   Recovery                               Immediate        --       --                                             2:00          96     138/60                                             4:00          85     130/58                                             6:00          --     126/60   REASON FOR STOPPING:  Achieved target HR                  TOTAL EXERCISE   TIME:  9:29   ACHIEVED HEART RATE:  164 (99%  max HR)                   EST. METS:  --   HR RESPONSE:  Normal                                      PEAK RPP:   BP RESPONSE:  Normal                                      CHEST PAIN:   None   OBSERVED DYSRHYTHMIAS:  None   ST SEGMENT CHANGES:  None                                 WALL MOTION ANALYSIS   LV WALL SEGMENT             PRE-EXERCISE              POST-EXERCISE   Basal Anteroseptal             Normal                 Hyperkinesis   Basal Septal                   Normal                 Hyperkinesis   Basal Inferoseptal  Normal                 Hyperkinesis   Basal Posterior                Normal                 Hyperkinesis   Basal Lateral                  Normal                 Hyperkinesis   Basal Anterior                 Normal                 Hyperkinesis   Mid Anteroseptal               Normal                 Hyperkinesis   Mid Septal                     Normal                 Hyperkinesis   Mid Inferoseptal               Normal                 Hyperkinesis   Mid Posterior                  Normal                 Hyperkinesis   Mid Lateral                    Normal                 Hyperkinesis   Mid Anterior                   Normal                 Hyperkinesis   Apical Septal                  Normal                 Hyperkinesis   Apical Inferior                Normal                 Hyperkinesis   Apical Lateral                 Normal                 Hyperkinesis   Apical Anterior                Normal                 Hyperkinesis   LV  Chamber Size                                        Smaller   ECG INTERPRETATION:  Negative by ECG criteria.   ECHO INTERPRETATION:  Normal.   OVERALL IMPRESSION:  Negative for ischemia.   ECG AND ECHO INTERPRETATION BY :  Harland German., M.D.   tb  D: 10/26/2002  T: 10/27/2002  2:57 A    wks

## 2006-06-09 NOTE — Procedures (Signed)
[facility]                          STRESS ECHOCARDIOGRAM REPORT   NAME:   [name], Kirsten                             SS#:   [id number]   DOB:     05/25/1946                                     AGE:        59   SEX:     F                                           ROOM#:     OP   MR#:    [id number]                                       DATE:   06/09/2006   REFERRING PHYS:   Chris L. Raymond                         TAPE/INDEX:   01/1649   PRETEST DATA:   INDICATION:  Chest pain   MEDS TAKEN:  --   MEDS HELD:  Nexium, Pravachol, Fish Oil, Multivitamin, Vitamin B, E,   Estrogen   TARGET HEART RATE:  161     85%:  136   RISK FACTORS:  Family history; hyperlipidemia;   BASELINE ECG:  Normal sinus rhythm; within normal limits;   EXERCISE SUPERVISED BY:  --   TEST RESULTS:             BRUCE PROTOCOL    STAGE    SPEED (MPH)   GRADE (%)    TIME (MIN:SEC)     HR       BP   Resting                                                  60     138/61      1          1.7           10            3:00         113     130/74      2          2.5           12            3:00         139     146/78      3          3.4           14            1:30         166       --     Recovery                               Immediate        --       --                                             2:00          96     183/71                                             4:00          71     175/71                                             6:00          70     164/75   REASON FOR STOPPING:  Fatigue, achieved target HR             TOTAL   EXERCISE TIME:  7:30   ACHIEVED HEART RATE:  166 (103%  max HR)                      EST. METS:   --   HR RESPONSE:  Normal                                          PEAK RPP:   24,236   BP RESPONSE:  Normal                                          CHEST PAIN:   None   OBSERVED DYSRHYTHMIAS:  PACs   ST SEGMENT CHANGES:  --                                 WALL MOTION ANALYSIS    LV WALL SEGMENT             PRE-EXERCISE             POST-EXERCISE   Basal Anteroseptal             Normal                 Hyperkinesis   Basal Septal                   Normal                 Hyperkinesis   Basal Inferoseptal             Normal                 Hyperkinesis   Basal Posterior                  Normal                 Hyperkinesis   Basal Lateral                  Normal                 Hyperkinesis   Basal Anterior                 Normal                 Hyperkinesis   Mid Anteroseptal               Normal                 Hyperkinesis   Mid Septal                     Normal                 Hyperkinesis   Mid Inferoseptal               Normal                 Hyperkinesis   Mid Posterior                  Normal                 Hyperkinesis   Mid Lateral                    Normal                 Hyperkinesis   Mid Anterior                   Normal                 Hyperkinesis   Apical Septal                  Normal                 Hyperkinesis   Apical Inferior                Normal                 Hyperkinesis   Apical Lateral                 Normal                 Hyperkinesis   Apical Anterior                Normal                 Hyperkinesis   LV  Chamber Size                                        Smaller   ECG INTERPRETATION:  Negative by ECG criteria.   ECHO INTERPRETATION:  Normal wall motion.   OVERALL IMPRESSION:  Negative for ischemia.   ECG AND ECHO INTERPRETATION BY :                                            RAMIN ALIMARD, M.D.   rc2  D: 06/09/2006  T: 06/09/2006  5:21 P

## 2006-06-09 NOTE — Procedures (Signed)
[facility]                          STRESS ECHOCARDIOGRAM REPORT   NAME:   Kirsten [name], Kirsten [name]                             SS#:   [id number]   DOB:     04/17/1946                                     AGE:        59   SEX:     F                                           ROOM#:     OP   MR#:    [id number]                                       DATE:   06/09/2006   REFERRING PHYS:   Marlei L. Raymond                         TAPE/INDEX:   01/1649   PRETEST DATA:   INDICATION:  Chest pain   MEDS TAKEN:  --   MEDS HELD:  Nexium, Pravachol, Fish Oil, Multivitamin, Vitamin B, E,   Estrogen   TARGET HEART RATE:  161     85%:  136   RISK FACTORS:  Family history; hyperlipidemia;   BASELINE ECG:  Normal sinus rhythm; within normal limits;   EXERCISE SUPERVISED BY:  --   TEST RESULTS:             BRUCE PROTOCOL    STAGE    SPEED (MPH)   GRADE (%)    TIME (MIN:SEC)     HR       BP   Resting                                                  60     138/61      1          1.7           10            3:00         113     130/74      2          2.5           12            3:00         139     146/78      3          3.4           14            1:30         166       --     Recovery                               Immediate        --       --                                             2:00          96     183/71                                             4:00          71     175/71                                             6:00          70     164/75   REASON FOR STOPPING:  Fatigue, achieved target HR             TOTAL   EXERCISE TIME:  7:30   ACHIEVED HEART RATE:  166 (103%  max HR)                      EST. METS:   --   HR RESPONSE:  Normal                                          PEAK RPP:   24,236   BP RESPONSE:  Normal                                          CHEST PAIN:   None   OBSERVED DYSRHYTHMIAS:  PACs   ST SEGMENT CHANGES:  --                                 WALL MOTION ANALYSIS   LV  WALL SEGMENT             PRE-EXERCISE             POST-EXERCISE   Basal Anteroseptal             Normal                 Hyperkinesis   Basal Septal                   Normal                 Hyperkinesis   Basal Inferoseptal             Normal                 Hyperkinesis   Basal Posterior                  Normal                 Hyperkinesis   Basal Lateral                  Normal                 Hyperkinesis   Basal Anterior                 Normal                 Hyperkinesis   Mid Anteroseptal               Normal                 Hyperkinesis   Mid Septal                     Normal                 Hyperkinesis   Mid Inferoseptal               Normal                 Hyperkinesis   Mid Posterior                  Normal                 Hyperkinesis   Mid Lateral                    Normal                 Hyperkinesis   Mid Anterior                   Normal                 Hyperkinesis   Apical Septal                  Normal                 Hyperkinesis   Apical Inferior                Normal                 Hyperkinesis   Apical Lateral                 Normal                 Hyperkinesis   Apical Anterior                Normal                 Hyperkinesis   LV  Chamber Size                                        Smaller   ECG INTERPRETATION:  Negative by ECG criteria.   ECHO INTERPRETATION:  Normal wall motion.   OVERALL IMPRESSION:  Negative for ischemia.   ECG AND ECHO INTERPRETATION BY :                                            RAMIN ALIMARD, M.D.   rc2  D: 06/09/2006  T: 06/09/2006  5:21 P

## 2006-06-09 NOTE — Procedures (Signed)
[facility]                          STRESS ECHOCARDIOGRAM REPORT   NAME:   Kirsten [name], Kirsten [name]                             SS#:   [id number]   DOB:     [birth year]                                     AGE:        59   SEX:     F                                           ROOM#:     OP   MR#:    [id number]                                       DATE:   06/09/2006   REFERRING PHYS:   Elease Hashimoto L. Marcy Salvo                         TAPE/INDEX:   01/1649   PRETEST DATA:   INDICATION:  Chest pain   MEDS TAKEN:  --   MEDS HELD:  Nexium, Pravachol, Fish Oil, Multivitamin, Vitamin B, E,   Estrogen   TARGET HEART RATE:  161     85%:  136   RISK FACTORS:  Family history; hyperlipidemia;   BASELINE ECG:  Normal sinus rhythm; within normal limits;   EXERCISE SUPERVISED BY:  --   TEST RESULTS:             BRUCE PROTOCOL    STAGE    SPEED (MPH)   GRADE (%)    TIME (MIN:SEC)     HR       BP   Resting                                                  60     138/61      1          1.7           10            3:00         113     130/74      2          2.5           12            3:00         139     146/78      3          3.4           14            1:30         166       --  Recovery                               Immediate        --       --                                             2:00          96     183/71                                             4:00          71     175/71                                             6:00          70     164/75   REASON FOR STOPPING:  Fatigue, achieved target HR             TOTAL   EXERCISE TIME:  7:30   ACHIEVED HEART RATE:  166 (103%  max HR)                      EST. METS:   --   HR RESPONSE:  Normal                                          PEAK RPP:   24,236   BP RESPONSE:  Normal                                          CHEST PAIN:   None   OBSERVED DYSRHYTHMIAS:  PACs   ST SEGMENT CHANGES:  --                                 WALL MOTION ANALYSIS   LV  WALL SEGMENT             PRE-EXERCISE             POST-EXERCISE   Basal Anteroseptal             Normal                 Hyperkinesis   Basal Septal                   Normal                 Hyperkinesis   Basal Inferoseptal             Normal                 Hyperkinesis   Basal Posterior  Normal                 Hyperkinesis   Basal Lateral                  Normal                 Hyperkinesis   Basal Anterior                 Normal                 Hyperkinesis   Mid Anteroseptal               Normal                 Hyperkinesis   Mid Septal                     Normal                 Hyperkinesis   Mid Inferoseptal               Normal                 Hyperkinesis   Mid Posterior                  Normal                 Hyperkinesis   Mid Lateral                    Normal                 Hyperkinesis   Mid Anterior                   Normal                 Hyperkinesis   Apical Septal                  Normal                 Hyperkinesis   Apical Inferior                Normal                 Hyperkinesis   Apical Lateral                 Normal                 Hyperkinesis   Apical Anterior                Normal                 Hyperkinesis   LV  Chamber Size                                        Smaller   ECG INTERPRETATION:  Negative by ECG criteria.   ECHO INTERPRETATION:  Normal wall motion.   OVERALL IMPRESSION:  Negative for ischemia.   ECG AND ECHO INTERPRETATION BY :  Cyndia Diver, M.D.   Fransisca Connors  D: 06/09/2006  T: 06/09/2006  5:21 P

## 2006-06-09 NOTE — Procedures (Signed)
[facility]                          STRESS ECHOCARDIOGRAM REPORT   NAME:   Kirsten [name], Kirsten [name]                             SS#:   [id number]   DOB:     1946/06/19                                     AGE:        59   SEX:     F                                           ROOM#:     OP   MR#:    [id number]                                       DATE:   06/09/2006   REFERRING PHYS:   Elease Hashimoto L. Marcy Salvo                         TAPE/INDEX:   01/1649   PRETEST DATA:   INDICATION:  Chest pain   MEDS TAKEN:  --   MEDS HELD:  Nexium, Pravachol, Fish Oil, Multivitamin, Vitamin B, E,   Estrogen   TARGET HEART RATE:  161     85%:  136   RISK FACTORS:  Family history; hyperlipidemia;   BASELINE ECG:  Normal sinus rhythm; within normal limits;   EXERCISE SUPERVISED BY:  --   TEST RESULTS:             BRUCE PROTOCOL    STAGE    SPEED (MPH)   GRADE (%)    TIME (MIN:SEC)     HR       BP   Resting                                                  60     138/61      1          1.7           10            3:00         113     130/74      2          2.5           12            3:00         139     146/78      3          3.4           14            1:30         166       --  Recovery                               Immediate        --       --                                             2:00          96     183/71                                             4:00          71     175/71                                             6:00          70     164/75   REASON FOR STOPPING:  Fatigue, achieved target HR             TOTAL   EXERCISE TIME:  7:30   ACHIEVED HEART RATE:  166 (103%  max HR)                      EST. METS:   --   HR RESPONSE:  Normal                                          PEAK RPP:   24,236   BP RESPONSE:  Normal                                          CHEST PAIN:   None   OBSERVED DYSRHYTHMIAS:  PACs   ST SEGMENT CHANGES:  --                                 WALL MOTION ANALYSIS    LV WALL SEGMENT             PRE-EXERCISE             POST-EXERCISE   Basal Anteroseptal             Normal                 Hyperkinesis   Basal Septal                   Normal                 Hyperkinesis   Basal Inferoseptal             Normal                 Hyperkinesis   Basal Posterior  Normal                 Hyperkinesis   Basal Lateral                  Normal                 Hyperkinesis   Basal Anterior                 Normal                 Hyperkinesis   Mid Anteroseptal               Normal                 Hyperkinesis   Mid Septal                     Normal                 Hyperkinesis   Mid Inferoseptal               Normal                 Hyperkinesis   Mid Posterior                  Normal                 Hyperkinesis   Mid Lateral                    Normal                 Hyperkinesis   Mid Anterior                   Normal                 Hyperkinesis   Apical Septal                  Normal                 Hyperkinesis   Apical Inferior                Normal                 Hyperkinesis   Apical Lateral                 Normal                 Hyperkinesis   Apical Anterior                Normal                 Hyperkinesis   LV  Chamber Size                                        Smaller   ECG INTERPRETATION:  Negative by ECG criteria.   ECHO INTERPRETATION:  Normal wall motion.   OVERALL IMPRESSION:  Negative for ischemia.   ECG AND ECHO INTERPRETATION BY :  Cyndia Diver, M.D.   Fransisca Connors  D: 06/09/2006  T: 06/09/2006  5:21 P

## 2006-06-12 NOTE — Procedures (Signed)
[facility]                                 PROCEDURE NOTE                             Kirsten Day, M.D.   [facility], [place]   E:   MR  [id number]                         DATE:            06/12/2006   #:   Kirsten Day  [id number]                      PT. LOCATION:    [id number]   #   Kirsten Day, M.D.               DOB:             18-Aug-1946   cc:    Fenton Foy, M.D.          Kirsten Day, M.D.   Extra copies to office:  0   PROCEDURES:   Esophagogastroduodenoscopy and colonoscopy   INDICATIONS:   Chest pain, reflux, family history of colon cancer   OPERATOR:   Cheila L. Marcy Salvo, MD   ASSISTANT:   Onalee Hua, RN; Hulda Humphrey, RN   SCOPE USED:   GIF-160, CF-160L colonoscope   MEDICATIONS:   Demerol 75, Versed 3   DESCRIPTION OF PROCEDURE:  Informed consent obtained from patient.   Esophagus intubated under direct visualization.  Olympus GIF-160 endoscope   was advanced with ease through the esophagus, stomach and duodenum.   Esophageal examination reveals limited esophageal peristalsis and a   patulous lower esophageal sphincter with a less than 2-cm hiatal hernia, GE   junction at 38 cm.  Stomach on retroflexed examination reveals no   gastritis, ulcerations or erosions.  Detailed examination of the antrum,   greater and lesser curvature, anterior and posterior wall, angulus and   fundus.  The pylorus is widely open, easily cannulated.  Duodenum to the   second portion is not remarkable.  The patient tolerated the procedure   well.   The patient was turned.  Digital rectal examination reveals fair tone and   no mass.  The Olympus CF-160L colonoscope is introduced blindly into the   rectum and advanced under direct vision to the cecum identified by the   ileocecal valve and appendiceal orifice.  Detailed examination of the   mucosa on withdrawal of the endoscope reveals no polyps, masses or colitis.   Rectal retroflexed examination reveals internal hemorrhoids.  The  patient   tolerated the procedure well.   IMPRESSIONS:      1. 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      2. Unremarkable colonoscopy except for internal hemorrhoids.   PLAN:      1. Repeat colonoscopy in 10 years.      2. Continue PPI and Reglan therapy for reflux.      3. Copy of this dictation to Dr. Arlester Marker.   Electronically Signed By:   Kirsten Day, M.D. 06/13/2006 08:03   _________________________________   Kirsten Day, M.D.   sc  D:  06/12/2006  T:  06/12/2006  2:24  P   [phone]

## 2006-06-12 NOTE — Procedures (Signed)
[facility]                                 PROCEDURE NOTE                             Kirsten Day, M.D.   [facility], [place]   E:   MR  [id number]                         DATE:            06/12/2006   #:   Kirsten Day  [id number]                      PT. LOCATION:    [id number]   #   Kirsten Day, M.D.               DOB:             03-Mar-1946   cc:    Fenton Foy, M.D.          Kirsten Day, M.D.   Extra copies to office:  0   PROCEDURES:   Esophagogastroduodenoscopy and colonoscopy   INDICATIONS:   Chest pain, reflux, family history of colon cancer   OPERATOR:   Jamina L. Marcy Salvo, MD   ASSISTANT:   Onalee Hua, RN; Hulda Humphrey, RN   SCOPE USED:   GIF-160, CF-160L colonoscope   MEDICATIONS:   Demerol 75, Versed 3   DESCRIPTION OF PROCEDURE:  Informed consent obtained from patient.   Esophagus intubated under direct visualization.  Olympus GIF-160 endoscope   was advanced with ease through the esophagus, stomach and duodenum.   Esophageal examination reveals limited esophageal peristalsis and a   patulous lower esophageal sphincter with a less than 2-cm hiatal hernia, GE   junction at 38 cm.  Stomach on retroflexed examination reveals no   gastritis, ulcerations or erosions.  Detailed examination of the antrum,   greater and lesser curvature, anterior and posterior wall, angulus and   fundus.  The pylorus is widely open, easily cannulated.  Duodenum to the   second portion is not remarkable.  The patient tolerated the procedure   well.   The patient was turned.  Digital rectal examination reveals fair tone and   no mass.  The Olympus CF-160L colonoscope is introduced blindly into the   rectum and advanced under direct vision to the cecum identified by the   ileocecal valve and appendiceal orifice.  Detailed examination of the   mucosa on withdrawal of the endoscope reveals no polyps, masses or colitis.    Rectal retroflexed examination reveals internal hemorrhoids.  The patient   tolerated the procedure well.   IMPRESSIONS:      1. 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      2. Unremarkable colonoscopy except for internal hemorrhoids.   PLAN:      1. Repeat colonoscopy in 10 years.      2. Continue PPI and Reglan therapy for reflux.      3. Copy of this dictation to Dr. Arlester Marker.   Electronically Signed By:   Kirsten Day, M.D. 06/13/2006 08:03   _________________________________   Kirsten Day, M.D.   sc  D:  06/12/2006  T:  06/12/2006  2:24  P   [id number]

## 2007-07-24 NOTE — ED Provider Notes (Signed)
[facility]                      EMERGENCY DEPARTMENT TREATMENT REPORT   NAME:  Kirsten Day, Kirsten Day        PT. LOCATION:      ER  ER27          DOB:                                                                         AGE:   MR #:      BILLING #:           DOA:  07/24/2007   DOD:              SEX:  F   [id number]   [id number]   cc:   CHIEF COMPLAINT:  Pressure in chest.   HPI:  This is a pleasant 61 year old female coming in complaining of   substernal pain and pressure.  She said it started abruptly at 8 p.m. this   evening.  She denies any stressful or strenuous event associated with this.   She denies any shortness of breath, visual disturbances, or extremity   weakness.  The patient was concerned because it is in the center part of   her chest, and brought herself in for evaluation and treatment.  She says   it does not feel like her acid reflux.  The patient denies any previous   episodes such as this.   She denies it being relieved.  She has tried   aspirin already today and has found no relief.  The patient is coming to be   evaluated in regards to her chest pain.   REVIEW OF SYSTEMS:   CONSTITUTIONAL: No fever, chills, or weight loss.   CONSTITUTIONAL: No fever, chills, or weight loss.   ENT: No sore throat, runny nose, or other URI symptoms.   RESPIRATORY: No cough, shortness of breath, or wheezing.   CARDIOVASCULAR: Chest pain, pressure, sternal.   GASTROINTESTINAL: No vomiting, diarrhea, or abdominal pain.   GENITOURINARY: No dysuria, frequency, or urgency.   MUSCULOSKELETAL: No joint pain or swelling.   INTEGUMENTARY: No rashes.   NEUROLOGICAL: No headaches, sensory or motor symptoms.   PAST MEDICAL HISTORY:  History of acid reflux and hypercholesterolemia.   ALLERGIES:  None.   SOCIAL HISTORY:  Denies any alcohol or tobacco abuse.   VITAL SIGNS:  BP 149/74, pulse 60, respirations 20, temperature 98.  Oxygen   100 on 2 L.   Pain 8/10.    GENERAL APPEARANCE:  Patient appears well developed and well nourished.   Appearance and behavior are age and situation appropriate.   Eyes:  Conjunctivae clear, lids normal.  Pupils equal, symmetrical, and   normally reactive.   ENT:  Neck is supple, nontender.   RESPIRATORY:  Clear and equal breath sounds.   CARDIOVASCULAR:  Heart normal rate and rhythm with no murmur.   GI:  Abdomen soft, nontender, without complaint of pain to palpation.  No   hepatomegaly or splenomegaly.   MUSCULOSKELETAL:  The patient has normal range of motion of her upper   extremity, normal distal pulse, and capillary refill.   NEUROLOGICAL:  Strength in her upper extremity was unremarkable.   CONTINUATION BY:  RICHARD MANOLIO   DIAGNOSTIC INTERPRETATION:  EKG showed normal sinus rhythm, no ischemic   changes.   Chest x-ray was read negative acute by myself.   BNP, cardiac enzymes, CBC are all normal except for slightly low white   count of 3.6.   COURSE IN THE EMERGENCY DEPARTMENT:  The patient was placed on cardiac,   blood pressure and oximetry monitors; remained in sinus rhythm throughout   her stay.  We discussed placement in the Observation Unit, but the patient   would rather follow up with Dr. Martie Round and get that set up as an outpatient.   She is willing to sign an AMA form after we discussed that she would be   taking all risk upon herself by leaving which could include a heart attack   and even death.  She states she understands that, I feel she was perfectly   competent to make that decision, and she has signed the [facility] form.  She was   given a GI cocktail prior to discharge and promises to return if she   changes her mind or has any worsening.   DIAGNOSIS:  Acute precordial pain, etiology unclear.  Against medical   advice.   DISPOSITION:  As above.   Electronically Signed By:   Imogene Burn, M.D. 07/31/2007 18:34   ____________________________   Imogene Burn, M.D.    My signature above authenticates this document and my orders, the final   diagnosis(es), discharge prescription(s) and instructions in the [facility] record.   ST  D:  07/24/2007  T:  07/25/2007  7:51 P   [id number]   st  D:  07/24/2007  T:  07/25/2007  7:13 A   [id number]

## 2007-08-03 NOTE — Procedures (Signed)
[facility]                      NUCLEAR CARDIOLOGY STRESS TEST REPORT   NAME:   Kirsten [name], Kirsten [name]                           SS#:   [id number]   DOB:     1946-09-30                                     AGE:      60   SEX:     F                                           ROOM#:   MR#:    [id number]                                       DATE:    08/03/2007   BILLING#  [id number]   REFERRING PHYS:   PRETEST DATA:   ISOTOPE:  Tc84m Sestamibi x2   INDICATION:   MEDS TAKEN:  None   MEDS HELD:  Pravachol, Nexium, vitamin E, calcium, fish oil, Estroven and   multi-vitamin   PATIENT'S WEIGHT:  64.5   TARGET HEART RATE:  100%:  160     85%:  136   RISK FACTORS:  Hyperlipidemia.   BASELINE ECG:  Normal sinus rhythm   EXERCISE SUPERVISED BY:  LAURA MUNTZER, PA   TEST RESULTS:               BRUCE PROTOCOL     STAGE    SPEED (MPH)   GRADE (%)    TIME (MIN:SEC)     HR       BP   Resting                                                   63     153/74       1          1.7           10            3:00         104     160/70       2          2.5           12            3:00         136     174/76       3          3.4           14            1:00         153   Recovery/Post  2:00         102     177/80                                              4:00          72     134/84                                              6:00          72     139/83   REASON FOR STOPPING:  Completed protocol              TOTAL EXERCISE TIME:   7:00   ACHIEVED HEART RATE:  153 (96%  max HR)               1:00   PEAK STAGE 3   HR RESPONSE:  Normal                                  PEAK RPP:  27081   BP RESPONSE:  Normal                                  CHEST PAIN:  None   OBSERVED DYSRHYTHMIAS:  PVCs, PACs, PJCs and atrial bigeminy   ST SEGMENT CHANGES:  None                                STRESS ECG REPORT   CONCLUSION:  NEGATIVE Treadmill Exercise Tolerance Test                                  NUCLEAR REPORT   FINDINGS:  (Read with Radiology)   The patient was injected with Tc12m at   rest and at peak stress Tc5m was injected.   Images were obtained in short   axis, horizontal long axis, and vertical long axis at both rest and stress.   There is no area of decreased uptake between the rest and stress images   consistent with normal perfusion. Gated SPECT imaging showed normal wall   motion with estimated EF of 72%.   OVERALL IMPRESSION:  1)  Normal perfusion; 2) Gated SPECT imaging showed   normal wall motion with an estimated EF of 72%.   Do not type on or below this line   Electronically Signed By:   Rayfield Citizen, M.D. 08/09/2007 12:12   ______________________________   Rayfield Citizen, M.D.   Values Dictated by Ned Clines, TECH   AKD: 08/03/2007  T: 08/03/2007  5:42 P    [id number]   cc:   Fenton Foy, M.D.         Rayfield Citizen, M.D.         UNKNOWN UNKNOWN

## 2007-08-03 NOTE — Procedures (Signed)
[facility]                      NUCLEAR CARDIOLOGY STRESS TEST REPORT   NAME:   [name], Kirsten A                           SS#:   [id number]   DOB:     02/15/1946                                     AGE:      60   SEX:     F                                           ROOM#:   MR#:    [id number]                                       DATE:    08/03/2007   BILLING#  [id number]   REFERRING PHYS:   PRETEST DATA:   ISOTOPE:  Tc99m Sestamibi x2   INDICATION:   MEDS TAKEN:  None   MEDS HELD:  Pravachol, Nexium, vitamin E, calcium, fish oil, Estroven and   multi-vitamin   PATIENT'S WEIGHT:  64.5   TARGET HEART RATE:  100%:  160     85%:  136   RISK FACTORS:  Hyperlipidemia.   BASELINE ECG:  Normal sinus rhythm   EXERCISE SUPERVISED BY:  LAURA MUNTZER, PA   TEST RESULTS:               BRUCE PROTOCOL     STAGE    SPEED (MPH)   GRADE (%)    TIME (MIN:SEC)     HR       BP   Resting                                                   63     153/74       1          1.7           10            3:00         104     160/70       2          2.5           12            3:00         136     174/76       3          3.4           14            1:00         153   Recovery/Post                                2:00         102     177/80                                              4:00          72     134/84                                              6:00          72     139/83   REASON FOR STOPPING:  Completed protocol              TOTAL EXERCISE TIME:   7:00   ACHIEVED HEART RATE:  153 (96%  max HR)               1:00   PEAK STAGE 3   HR RESPONSE:  Normal                                  PEAK RPP:  27081   BP RESPONSE:  Normal                                  CHEST PAIN:  None   OBSERVED DYSRHYTHMIAS:  PVCs, PACs, PJCs and atrial bigeminy   ST SEGMENT CHANGES:  None                                STRESS ECG REPORT   CONCLUSION:  NEGATIVE Treadmill Exercise Tolerance Test                                  NUCLEAR REPORT   FINDINGS:  (Read with Radiology)   The patient was injected with Tc99m at   rest and at peak stress Tc99m was injected.   Images were obtained in short   axis, horizontal long axis, and vertical long axis at both rest and stress.   There is no area of decreased uptake between the rest and stress images   consistent with normal perfusion. Gated SPECT imaging showed normal wall   motion with estimated EF of 72%.   OVERALL IMPRESSION:  1)  Normal perfusion; 2) Gated SPECT imaging showed   normal wall motion with an estimated EF of 72%.   Do not type on or below this line   Electronically Signed By:   PARTHA MANCHIKALAPUDI, M.D. 08/09/2007 12:12   ______________________________   PARTHA MANCHIKALAPUDI, M.D.   Values Dictated by CINDY HEIL, TECH   AKD: 08/03/2007  T: 08/03/2007  5:42 P    [id number]   cc:   VANESSA A. BLOWE, M.D.         PARTHA MANCHIKALAPUDI, M.D.         UNKNOWN UNKNOWN

## 2010-10-28 NOTE — Discharge Summary (Signed)
[facility]   ED Discharge Summary   NAME:  Kirsten Day, Kirsten Day   SEX:   F   DOB: 10-31-1946   MR#    [id number]   ROOM:     ACCT#  [id number]       cc: Arlester Marker MD       PRIMARY CARE Zyann Mabry:   Dr. Arlester Marker       ER PHYSICIAN:   Dr. Dianna Rossetti       DISCHARGE PHYSICIAN:   Dr. Carmela Hurt       TIME AND DATE OF ADMISSION:   10/28/2010 at [phone].       TIME AND DATE OF DISCHARGE:   10/28/2010 at 1130.       CHIEF COMPLAINT:     Chest pain.       HISTORY OF PRESENT ILLNESS:   The patient was seen in the emergency department for evaluation of chest pain.     The evaluation was unremarkable and subsequently the patient was assigned to    observation under chest pain protocol.  The patient remained pain free and    did not develop other symptoms.  The cardiac enzymes were negative and the    patient underwent an EST, normal LV function, negative for ischemia.         PHYSICAL EXAM:   VITAL SIGNS:  Blood pressure 107/68, pulse 69, respirations 18, temperature    98, oxygen saturation 99% on room air.   HEENT:  Membranes moist.   LUNGS:  Clear.   HEART:  Regular.   ABDOMEN:  Soft and nontender.          The cardiac diagnostic testing and results were explained.  The patient was    counseled to seek further evaluation by their physician.        DIAGNOSIS:   Acute Chest Pain with negative cardiac testing.        PLAN:   Follow up within 1 week with their physician or the on-call Doctor.  Return if    worsening or new chest pain, trouble breathing or any concerns.       The accuracy and limitations of the resulted cardiac labs and stress testing    were explained to the patient.  A followup appointment with the patient's    physician within 1 week was recommended for a recheck and consideration of    additional testing, if needed.  The patient was also advised to return to the    ED for new or worsening chest pain, shortness of breath or any other     concerns.  The patient was personally evaluated by myself and Dr. Carmela Hurt    who agrees with the above assessment and plan.           ___________________   Christiana Pellant MD   Dictated NF:[id number] Francesca Jewett, [place]   hp   D:10/28/2010   T: 10/28/2010 12:40:29   [id number]

## 2010-10-28 NOTE — ED Provider Notes (Signed)
[facility]   EMERGENCY DEPARTMENT TREATMENT REPORT   NAME:  Kirsten Day, Kirsten Day   SEX:   F   ADMIT: 10/28/2010   DOB:   16-Jan-1946   MR#    [id number]   ROOM:     TIME SEEN: 05 49 AM   ACCT#  [id number]       cc: Dr. Coralyn Mark        PRIMARY CARE PHYSICIAN:   Dr. Coralyn Mark.       TIME EVALUATION:   0352.       CHIEF COMPLAINT:   Chest pressure and pain.       HISTORY OF PRESENT ILLNESS:   The patient is a 64 year old female, appears much younger than stated age.     States that she went to bed with a pressure and she is indicating her mid    sternum from her epigastric region to about midway up her sternum.  She states    it has been a constant pressure and it has been since afternoon prior to    arrival.  It worried her because she was also somewhat short of breath.  It    got worse when she laid down.  She states that she also started a new workout    routine with some weights prior to the onset of the pain and wondered if that    was the problem.  Denies nausea, vomiting.  Denies sweating.  Denies fever,    recent illness, injury or other complaints.       REVIEW OF SYSTEMS:   CONSTITUTIONAL:  No fever, chills, or  weight loss.   ENT:  No sore throat, runny nose, or other URI symptoms.   HEMATOLOGIC/LYMPHATIC:  No excessive bruising or lymph node swelling.   RESPIRATORY:  The patient has had a constant low level of shortness of breath    for several hours prior to arrival.   CARDIOVASCULAR:  She has had a midsternal chest pressure, nonradiating, no    palpitations, that onset was after having a new workout.  It was worsened by    lying down.   GASTROINTESTINAL:  No vomiting, diarrhea, or abdominal pain.   MUSCULOSKELETAL:  No joint pain or swelling.   INTEGUMENTARY:  No rashes.   NEUROLOGIC:  No headaches, sensory or motory symptoms.       PAST MEDICAL HISTORY:   Significant for high cholesterol, tubal ligation, left breast biopsy,    gastroesophageal reflux.       SOCIAL HISTORY:    Denies alcohol, tobacco and drug use.       FAMILY HISTORY:   Does include CAD.       ALLERGIES:   NONE.       CURRENT MEDICATIONS:   Pravastatin.       PHYSICAL EXAMINATION:   VITAL SIGNS:  Blood pressure 139/75, pulse 55, respirations 16, temperature    98.1, pain 7, O2 sats 100% on room air.   GENERAL APPEARANCE:  Again, the patient appears younger than stated age, well    developed, well nourished.   HEENT:  Mouth/Throat:  Surfaces of the pharynx, palate, and tongue are pink,    moist, and without lesions.   RESPIRATORY:  Clear and equal breath sounds.  No respiratory distress,    tachypnea, or accessory muscle use.   CARDIOVASCULAR:  Heart is regular, no murmurs or rubs.   CHEST:  Symmetrical without masses or tenderness.   GASTROINTESTINAL:  Abdomen is soft, nondistended, mildly  tender to palpation    in the epigastrium, no rebound, no peritoneal signs.  No cva tenderness to    percussion.   MUSCULOSKELETAL:  Stance and gait appear normal.   SKIN:  Warm and dry without rashes.       CONTINUATION BY Dianna Rossetti, MD:        INITIAL ASSESSMENT AND MANAGEMENT PLAN:      The patient was seen with Carie Towns, PA-C, gone over the history and    physical examination together.  The patient is a 64 year old woman who    complains of epigastric precordial pressure sensation.  It began around 4 or    so this afternoon, got suddenly worse this evening, so she came in for    evaluation.  She does have a history of reflux.  She also did some lifting and    upper body workout.  She says the pain is not worse with movement though it    is worse when she lays down.  She does not any nausea or any burping.   At t    his point,  we will proceed with cardiopulmonary testing.  On examination, she    is nontender to palpation.  Heart, regular rate and rhythm and normal breath    sounds.         DIAGNOSTIC STUDIES:      CBC show a white count 3.1, hemoglobin and hematocrit and platelet count are     normal, no differential shift.  Cardiac enzymes are negative.  Electrolytes    are unremarkable.  Chest x-ray read by myself as unremarkable.  EKG shows    sinus rhythm nonspecific changes, no signs of acute ischemia.  CPK 238, MB    index and troponin are negative.  Myoglobin was mildly elevated at 75 and    electrolytes are unremarkable.         FINAL IMPRESSION:   Precordial pain.       DISPOSITION AND PLAN:      Assign to Emergency Department observation.  If further diagnostics are    negative, I would place her on an antiacid, have her refrain from upper body    exercises and follow up with doctor for a recheck.  Given that she has had a    stress test for a year and because of her discomfort, I felt that she goes to    be Emergency Department observation unit for cardiac testing and rule out.           ___________________   Dianna Rossetti M.D.   Dictated By: Wynelle Florence. Towns, PA-C   tc   D:10/28/2010   T: 10/28/2010 07:19:31   [id number]

## 2010-10-28 NOTE — Procedures (Signed)
Test Reason : Chest pain   Blood Pressure : ***/*** mmHG   Vent. Rate : 057 BPM     Atrial Rate : 057 BPM      P-R Int : 164 ms          QRS Dur : 072 ms       QT Int : 436 ms       P-R-T Axes : 000 172 129 degrees      QTc Int : 424 ms   Sinus bradycardia   *** Suspect arm lead reversal, interpretation assumes no reversal   Abnormal ECG   When compared with ECG of 24-Jul-2007 00:24,   limb leads reversed.   Confirmed by Renaee Munda M.D., Onalee Hua [phone]) on 10/28/2010 2:21:32 PM   Referred By:             Overread By: Jarold Song.D.

## 2010-10-28 NOTE — Procedures (Signed)
Study ID: [id number]                                                      [facility]                                                      [address] [place], [place] [address]                            Exercise Stress Echocardiogram Report           Name: Kirsten Day, Kirsten Day  Study Date: 10/28/2010 09:07 AM   MRN: [id number]                 Patient Location: ERO^EO10^EO10^C   DOB: 1946/05/28             Age: 64 yrs   Height: 67 in               Weight: 139 lb                    BSA: 1.7 meters2   BP: 124/69 mmHg             HR: 78   Gender: Female              Account #: [id number]   Reason For Study: chest pain   Ordering Physician: Dianna Rossetti   Performed By: Nancie Neas., RDCS       Interpretation Summary   The Electrocardiographic Interpretation: negative by ECG criteria.   The Echocardiographic Interpretation: normal wall motion.   The OverAll Impression : negative for ischemia .       Stress Results              Protocol:  Bruce              Target HR: 133 bpm         Maximum Predicted HR: 157 bpm                          Stress Duration:   6:31 mm:ss *                      Maximum Stress HR: 142 bpm *           Stress Comments   A treadmill exercise test according to Bruce protocol was performed. The   baseline ECG displays normal sinus rhythm. Electrocardiographic response was   negative by ECG criteria. Arrhythmia induced during stress: occasional PAC's.   Test was terminated due to target heart rate was achieved. Patient developed   no symptoms. Normal blood pressure response.           I  WMSI = 1.00     % Normal = 100                                                                   REST: No LV                                                                   segmental wall                                                                   motion                                                                    abnormalities.                                                       II      WMSI = 1.00     % Normal = 100                                                                   PEAK EXERCISE   : No                                                                   LV segmental    wall                                                                   motion                                                                     abnormalities.                                                                                                                   Segments  Size   X - Cannot   1 - Normal   2 -          3 - Akinetic4 -          1-2     small   Interpret                 Hypokinetic              Dyskinetic   3-5     moder   ate   5 -                                                             6-14    large   Aneurysmal                                                      15-16   diffu   se               Left Ventricle   The left ventricular chamber size at rest is normal. The left ventricular   chamber size at peak stress is smaller.       _____________________________________________________________________________   __           Electronically signed byDr Megan Salon, MD   10/28/2010 09:40 AM

## 2010-10-28 NOTE — Procedures (Signed)
Study ID: [id number]                                                      [facility]                                                      [address]                                                       [place], [place] [address]                            Exercise Stress Echocardiogram Report           Name: Day, Kirsten A  Study Date: 10/28/2010 09:07 AM   MRN: [id number]                 Patient Location: ERO^EO10^EO10^C   DOB: 03/28/1946             Age: 63 yrs   Height: 67 in               Weight: 139 lb                    BSA: 1.7 meters2   BP: 124/69 mmHg             HR: 78   Gender: Female              Account #: [id number]   Reason For Study: chest pain   Ordering Physician: HSU, HELEN   Performed By: Chitwood, Kristen A., RDCS       Interpretation Summary   The Electrocardiographic Interpretation: negative by ECG criteria.   The Echocardiographic Interpretation: normal wall motion.   The OverAll Impression : negative for ischemia .       Stress Results              Protocol:  Bruce              Target HR: 133 bpm         Maximum Predicted HR: 157 bpm                          Stress Duration:   6:31 mm:ss *                      Maximum Stress HR: 142 bpm *           Stress Comments   A treadmill exercise test according to Bruce protocol was performed. The   baseline ECG displays normal sinus rhythm. Electrocardiographic response was   negative by ECG criteria. Arrhythmia induced during stress: occasional PAC's.   Test was terminated due to target heart rate was achieved. Patient developed   no symptoms. Normal blood pressure response.           I        WMSI = 1.00     % Normal = 100                                                                   REST: No LV                                                                   segmental wall                                                                   motion                                                                    abnormalities.                                                       II      WMSI = 1.00     % Normal = 100                                                                   PEAK EXERCISE   : No                                                                   LV segmental    wall                                                                   motion                                                                     abnormalities.                                                                                                                   Segments  Size   X - Cannot   1 - Normal   2 -          3 - Akinetic4 -          1-2     small   Interpret                 Hypokinetic              Dyskinetic   3-5     moder   ate   5 -                                                             6-14    large   Aneurysmal                                                      15-16   diffu   se               Left Ventricle   The left ventricular chamber size at rest is normal. The left ventricular   chamber size at peak stress is smaller.       _____________________________________________________________________________   __           Electronically signed byDr David Adler, MD   10/28/2010 09:40 AM

## 2010-10-28 NOTE — ED Provider Notes (Signed)
KNOWN ALLERGIES   NKDA       TRIAGE   PATIENT: NAME: Kirsten Day, AGE: 64, GENDER: female,         DOB: Sun 01/14/1946, TIME OF GREET: Thu Oct 28, 2010 03:33, SSN:         [id number], KG WEIGHT: 79.4 (est.), HEIGHT: 170cm, MEDICAL RECORD         NUMBER: [phone], ACCOUNT NUMBER: [id number], PCP: Arlester Marker,.   ADMISSION: URGENCY: 2, DEPT: Emergency, BED: WAITING.   VITAL SIGNS: BP 139/75, (Lying), Pulse 55, Resp 16, Temp 98.1,         (Oral), Pain 7, O2 Sat 100, on Room air, Time 10/28/2010 03:37.   COMPLAINT:  Chest Pressure/Pain.   PRESENTING COMPLAINT:  Mid chest pressure &amp; epigastric pain         since this afternoon, worse when lying down. PT also states was         working out today on wt. machines.   PAIN: Patient complains of pain, Pain described as aching, On a         scale 0-10 patient rates pain as 7.   TRIAGE CARE: Other Care: EKG.   TB SCREENING: TB screen negative for this patient.   ABUSE SCREENING: Patient denies physical abuse or threats.   FALL RISK: Patient has a low risk of falling, Patient has no         history of falling (0), No secondary diagnosis (0), None/bed         rest/nurse assist (0), No IV or IV access (0), Normal/bed         rest/wheelchair (0), Oriented to own ability (0), Total 0.   SUICIDAL IDEATION: Suicidal ideation is not present.   ADVANCE DIRECTIVES: Patient does not have advance directives,         Triage assessment performed.   PROVIDERS: TRIAGE NURSE: Harolyn Rutherford, RN.   PREVIOUS VISIT ALLERGIES: Nkda.       CURRENT MEDICATIONS   Pravastatin Sodium:  20 mg Oral once a day.       MEDICATION SERVICE   Carafate:  Order: Carafate (Sucralfate) - Dose: 1 gm :         Oral         Ordered by: April Manson, PA-C         Entered by: April Manson, PA-C Thu Oct 28, 2010 04:04 ,          Acknowledged by: Guy Franco, RN Thu Oct 28, 2010 04:09         Documented as given by: Guy Franco, RN Thu Oct 28, 2010 04:27           Patient, Medication, Dose, Route and Time verified prior to         administration.          Time given: 0425, Amount given: 1 gm, Site: Medication administered         P.O., Correct patient, time, route, dose and medication confirmed         prior to administration, Patient advised of actions and side-effects         prior to administration, Allergies confirmed and medications reviewed         prior to administration.    : Follow Up :  Decreased pain, On a scale 0-10 patient rates         pain as 2.   Lidocaine Viscous:  Order: Lidocaine Viscous (Lidocaine  Hydrochloride) - Dose: 10 mL : Oral         Ordered by: April Manson, PA-C         Entered by: April Manson, PA-C Thu Oct 28, 2010 04:04 ,          Acknowledged by: Guy Franco, RN Thu Oct 28, 2010 04:09         Documented as given by: Guy Franco, RN Thu Oct 28, 2010 04:27          Patient, Medication, Dose, Route and Time verified prior to         administration.          Time given: 0425, Amount given: 10 ml, Site: Medication administered         P.O., Mixed in Mylanta, Correct patient, time, route, dose and         medication confirmed prior to administration, Patient advised of         actions and side-effects prior to administration, Allergies confirmed         and medications reviewed prior to administration.    : Follow Up :  Decreased pain, On a scale 0-10 patient rates         pain as 2.   Mylanta:  Order: Mylanta (Aluminum Hydroxide/Magnesium         Hydroxide/Simethicone) - Dose: 30 mL : Oral         Single Dose Exceeded - Rationale: Patient tolerated similiar in past         Ordered by: April Manson, PA-C         Entered by: April Manson, PA-C Thu Oct 28, 2010 04:04 ,          Acknowledged by: Guy Franco, RN Thu Oct 28, 2010 04:09         Documented as given by: Guy Franco, RN Thu Oct 28, 2010 04:27          Patient, Medication, Dose, Route and Time verified prior to         administration.           Time given: 0425, Amount given: 30 ml, Correct patient, time, route,         dose and medication confirmed prior to administration, Patient         advised of actions and side-effects prior to administration,         Allergies confirmed and medications reviewed prior to administration.    : Follow Up :  Decreased pain, On a scale 0-10 patient rates         pain as 2.   Mylanta:  Order: Mylanta (Aluminum Hydroxide/Magnesium         Hydroxide/Simethicone) - Dose: 30 mL : Oral         Single Dose Exceeded - Rationale: Patient tolerated similiar in past         Ordered by: Dianna Rossetti, MD         Entered by: Dianna Rossetti, MD Thu Oct 28, 2010 06:22          Documented as given by: Guy Franco, RN Thu Oct 28, 2010 06:26          Patient, Medication, Dose, Route and Time verified prior to         administration.          Time given: 0625, Amount given: 30 ml, Site: Medication administered         P.O., Correct patient,  time, route, dose and medication confirmed         prior to administration, Patient advised of actions and side-effects         prior to administration, Allergies confirmed and medications reviewed         prior to administration.   Pepcid:  Order: Pepcid (Famotidine) - Dose: 20 mg : IV         Ordered by: April Manson, PA-C         Entered by: April Manson, PA-C Thu Oct 28, 2010 04:04 ,          Acknowledged by: Guy Franco, RN Thu Oct 28, 2010 04:09         Documented as given by: Guy Franco, RN Thu Oct 28, 2010 04:27          Patient, Medication, Dose, Route and Time verified prior to         administration.          Time given: 0425, Amount given: 20 mg, IVP, Initial medication,         Slowly, Catheter placement confirmed via flush prior to         administration, IV site without signs or symptoms of infiltration         during medication administration, No swelling during administration,         No drainage during administration, IV flushed after administration,          Correct patient, time, route, dose and medication confirmed prior to         administration, Patient advised of actions and side-effects prior to         administration, Allergies confirmed and medications reviewed prior to         administration.    : Follow Up :  Decreased pain, On a scale 0-10 patient rates         pain as 2.   Key:     CATO=Towns, PA-C, Reed Breech, RN, Glenda  HHH=Hsu, MD, [facility]     KM5=Mullaney, RN, Tresa Endo

## 2010-10-28 NOTE — Procedures (Signed)
Test Reason : Chest pain   Blood Pressure : ***/*** mmHG   Vent. Rate : 057 BPM     Atrial Rate : 057 BPM      P-R Int : 164 ms          QRS Dur : 072 ms       QT Int : 436 ms       P-R-T Axes : 000 172 129 degrees      QTc Int : 424 ms   Sinus bradycardia   *** Suspect arm lead reversal, interpretation assumes no reversal   Abnormal ECG   When compared with ECG of 24-Jul-2007 00:24,   limb leads reversed.   Confirmed by Adler M.D., David ([id number]) on 10/28/2010 2:21:32 PM   Referred By:             Overread By: David Adler M.D.

## 2011-10-26 ENCOUNTER — Other Ambulatory Visit

## 2013-02-02 DIAGNOSIS — C50919 Malignant neoplasm of unspecified site of unspecified female breast: Secondary | ICD-10-CM | POA: Insufficient documentation

## 2013-02-02 HISTORY — DX: Malignant neoplasm of unspecified site of unspecified female breast: C50.919

## 2013-02-04 DIAGNOSIS — D0590 Unspecified type of carcinoma in situ of unspecified breast: Secondary | ICD-10-CM | POA: Insufficient documentation

## 2013-02-04 HISTORY — PX: MASTECTOMY: SHX3

## 2013-08-05 HISTORY — PX: PLACEMENT OF BREAST IMPLANTS: SHX6334

## 2013-08-26 DIAGNOSIS — T8544XA Capsular contracture of breast implant, initial encounter: Secondary | ICD-10-CM | POA: Insufficient documentation

## 2013-08-26 DIAGNOSIS — Z9010 Acquired absence of unspecified breast and nipple: Secondary | ICD-10-CM | POA: Insufficient documentation

## 2013-08-26 DIAGNOSIS — N650 Deformity of reconstructed breast: Secondary | ICD-10-CM | POA: Insufficient documentation

## 2013-08-26 DIAGNOSIS — N651 Disproportion of reconstructed breast: Secondary | ICD-10-CM | POA: Insufficient documentation

## 2014-06-05 DIAGNOSIS — K529 Noninfective gastroenteritis and colitis, unspecified: Secondary | ICD-10-CM | POA: Insufficient documentation

## 2015-03-03 ENCOUNTER — Encounter: Attending: Family | Primary: Family Medicine

## 2015-03-17 ENCOUNTER — Encounter: Attending: Family | Primary: Family Medicine

## 2015-05-14 DIAGNOSIS — M6530 Trigger finger, unspecified finger: Secondary | ICD-10-CM | POA: Insufficient documentation

## 2015-12-25 DIAGNOSIS — R091 Pleurisy: Secondary | ICD-10-CM

## 2015-12-26 ENCOUNTER — Emergency Department: Admit: 2015-12-26 | Payer: MEDICARE | Primary: Family Medicine

## 2015-12-26 ENCOUNTER — Inpatient Hospital Stay
Admit: 2015-12-26 | Discharge: 2015-12-26 | Disposition: A | Discharge status: Home / Self Care | Payer: MEDICARE | Attending: Emergency Medicine

## 2015-12-26 LAB — POC CHEM8
BUN: 18 mg/dl (ref 7–25)
CALCIUM,IONIZED: 4.7 mg/dL (ref 4.40–5.40)
CO2, TOTAL: 28 mmol/L (ref 21–32)
Chloride: 103 mEq/L (ref 98–107)
Creatinine: 0.7 mg/dl (ref 0.6–1.3)
Glucose: 98 mg/dL (ref 74–106)
HCT: 40 % (ref 38–45)
HGB: 13.6 gm/dl (ref 12.4–17.2)
Potassium: 5.2 mEq/L — ABNORMAL HIGH (ref 3.5–4.9)
Sodium: 138 mEq/L (ref 136–145)

## 2015-12-26 LAB — EKG, 12 LEAD, INITIAL
Atrial Rate: 59 {beats}/min
Calculated P Axis: 54 degrees
Calculated R Axis: -8 degrees
Calculated T Axis: 45 degrees
P-R Interval: 150 ms
Q-T Interval: 426 ms
QRS Duration: 76 ms
QTC Calculation (Bezet): 421 ms
Ventricular Rate: 59 {beats}/min

## 2015-12-26 LAB — CBC WITH AUTOMATED DIFF
BASOPHILS: 0.3 % (ref 0–3)
EOSINOPHILS: 2.1 % (ref 0–5)
HCT: 38.9 % (ref 37.0–50.0)
HGB: 13.1 gm/dl (ref 13.0–17.2)
IMMATURE GRANULOCYTES: 0.3 % (ref 0.0–3.0)
LYMPHOCYTES: 59.2 % — ABNORMAL HIGH (ref 28–48)
MCH: 34.6 pg (ref 25.4–34.6)
MCHC: 33.7 gm/dl (ref 30.0–36.0)
MCV: 102.6 fL — ABNORMAL HIGH (ref 80.0–98.0)
MONOCYTES: 10.3 % (ref 1–13)
MPV: 9.1 fL (ref 6.0–10.0)
NEUTROPHILS: 27.8 % — ABNORMAL LOW (ref 34–64)
NRBC: 0 (ref 0–0)
PLATELET: 204 10*3/uL (ref 140–450)
RBC: 3.79 M/uL (ref 3.60–5.20)
RDW-SD: 47.8 — ABNORMAL HIGH (ref 36.4–46.3)
WBC: 3.3 10*3/uL — ABNORMAL LOW (ref 4.0–11.0)

## 2015-12-26 LAB — POC TROPONIN: Troponin-I: 0 ng/ml (ref 0.00–0.07)

## 2015-12-26 LAB — D DIMER: D DIMER: 0.5 ug/mL (FEU) (ref 0.01–0.50)

## 2015-12-26 LAB — D-DIMER, QUANTITATIVE: D-Dimer, Quant: 0.5 ug/mL (FEU) (ref 0.01–0.50)

## 2015-12-26 MED ORDER — METHOCARBAMOL 500 MG TAB
500 mg | ORAL_TABLET | Freq: Four times a day (QID) | ORAL | 0 refills | Status: DC
Start: 2015-12-26 — End: 2020-09-03

## 2015-12-26 MED ORDER — IOPAMIDOL 76 % IV SOLN
370 mg iodine /mL (76 %) | Freq: Once | INTRAVENOUS | Status: AC
Start: 2015-12-26 — End: 2015-12-26
  Administered 2015-12-26: 08:00:00 via INTRAVENOUS

## 2015-12-26 MED ORDER — SODIUM CHLORIDE 0.9 % IJ SYRG
Freq: Once | INTRAMUSCULAR | Status: DC
Start: 2015-12-26 — End: 2015-12-26

## 2015-12-26 MED ORDER — SODIUM CHLORIDE 0.9 % IJ SYRG
Freq: Once | INTRAMUSCULAR | Status: AC
Start: 2015-12-26 — End: 2015-12-26
  Administered 2015-12-26: 08:00:00 via INTRAVENOUS

## 2015-12-26 MED FILL — ISOVUE-370  76 % INTRAVENOUS SOLUTION: 370 mg iodine /mL (76 %) | INTRAVENOUS | Qty: 80

## 2015-12-26 NOTE — ED Triage Notes (Signed)
Pt reports chest pain intermittent since christmas.  Back pain started 5 days ago.

## 2015-12-26 NOTE — ED Notes (Signed)
Discharge instructions reviewed with patient. Prescription for Robaxin was also reviewed with patient. Patient verbalized understanding. Opportunity for questions and clarifications  was provided. Patient discharged to home. Patient driving self home.

## 2015-12-26 NOTE — ED Triage Notes (Signed)
Pt complains of sob, chest congestion and tenderness and left lower back pain x5 days.  Pt states she feels like she needs to cough but is unable to.  Pt states she was diagnosed with pneumonia 1/2 and has been on 2 antibiotics but not feeling better.

## 2015-12-26 NOTE — ED Provider Notes (Signed)
[facility]  Emergency Department Treatment Report    Patient: Kirsten Day Age: 70 y.o. Sex: female    Date of Birth: Dec 31, 1945 Admit Date: 12/25/2015 PCP: Charlyne Petrin, MD   MRN: [id number]  CSN: [id number]     Room: ER02/ER02 Time Dictated: 6:51 AM      I hereby certify this patient for admission based upon medical necessity as ??  noted below:    Chief Complaint   Back Pain to take a deep breath  History of Present Illness   70 y.o. female with history of breast cancer several years ago apparently [facility] first time with pneumonia and placed on antibiotics that he told her that the following day that she did not have a pneumonia x-ray looked normal but she states that when she would take a deep breath it would hurt when you press on it also hurts but is on the back she had no chest pain that radiates to the arm neck or jaw hasn't taken any long trips recently tenderness she just wanted things evaluated since she went there for the second time was placed on antibiotics and does not feel any better she states that she never had a fever never had any chills or body aches or skin rashes.    Review of Systems   Constitutional: No fever, chills, or weight loss  Eyes: No visual symptoms.  ENT: No sore throat, runny nose or ear pain.  Respiratory: No cough, dyspnea or wheezing.  Cardiovascular: No chest pain, pressure, palpitations, tightness or heaviness.  Gastrointestinal: No vomiting, diarrhea or abdominal pain.  Genitourinary: No dysuria, frequency, or urgency.  Musculoskeletal: No joint pain or swelling.  Integumentary: No rashes.  Neurological: No headaches, sensory or motor symptoms.  Denies complaints in all other systems.    Past Medical/Surgical History   No past medical history on file.  No past surgical history on file.    Social History     Social History     Social History   ??? Marital status: WIDOWED     Spouse name: N/A    ??? Number of children: N/A   ??? Years of education: N/A     Social History Main Topics   ??? Smoking status: Not on file   ??? Smokeless tobacco: Not on file   ??? Alcohol use Not on file   ??? Drug use: Not on file   ??? Sexual activity: Not on file     Other Topics Concern   ??? Not on file     Social History Narrative       Family History   No family history on file.    Home Medications     Prior to Admission medications    Medication Sig Start Date End Date Taking? Authorizing Provider   methocarbamol (ROBAXIN) 500 mg tablet Take 1 Tab by mouth four (4) times daily. 12/26/15  Yes Coral Ceo, PA       Allergies   No Known Allergies    Physical Exam     Visit Vitals   ??? BP 147/69 (BP 1 Location: Left arm, BP Patient Position: At rest;Supine)   ??? Pulse 62   ??? Temp 97.8 ??F (36.6 ??C)   ??? Resp 13   ??? Ht 5\' 7"  (1.702 m)   ??? Wt 61.2 kg (135 lb)   ??? SpO2 97%   ??? BMI 21.14 kg/m2     Constitutional:  Patient appears well developed and well nourished. Marland Kitchen Appearance and behavior are age and situation appropriate.  HEENT: Conjunctiva clear.  PERRLA. Mucous membranes moist, non-erythematous. Surface of the pharynx, palate, and tongue are pink, moist and without lesions.  Neck: supple, non tender, symmetrical, no masses or JVD.   Respiratory: lungs clear to auscultation, nonlabored respirations. No tachypnea or accessory muscle use.  Cardiovascular: heart regular rate and rhythm without murmur rubs or gallops.   Calves soft and non-tender. Distal pulses 2+ and equal bilaterally.  No peripheral edema or significant variscosities.    Gastrointestinal:  Abdomen soft, nontender without complaint of pain to palpation  Musculoskeletal: Nail beds pink with prompt capillary refill tenderness to the left scapula  Integumentary: warm and dry without rashes or lesions  Neurologic: alert and oriented, Sensation intact, motor strength equal and symmetric.  No facial asymmetry or dysarthria.    Impression and Management Plan    70 year old female resident emergency department complaining of pleuritic back pain with some mild shortness of breath been going on for several weeks we will go ahead and get cardiac markers make sure this is not cardiac in nature does although it does not sound like it were worried about a pneumonia or pulmonary embolus. We will go ahead and get basic labs CT of the chest  Diagnostic Studies   Lab:   Recent Results (from the past 12 hour(s))   POC CHEM8    Collection Time: 12/26/15 12:43 AM   Result Value Ref Range    Sodium 138 136 - 145 mEq/L    Potassium 5.2 (H) 3.5 - 4.9 mEq/L    Chloride 103 98 - 107 mEq/L    CO2, TOTAL 28 21 - 32 mmol/L    Glucose 98 74 - 106 mg/dL    BUN 18 7 - 25 mg/dl    Creatinine 0.7 0.6 - 1.3 mg/dl    HCT 40 38 - 45 %    HGB 13.6 12.4 - 17.2 gm/dl    CALCIUM,IONIZED 4.70 4.40 - 5.40 mg/dL   POC TROPONIN-I    Collection Time: 12/26/15 12:46 AM   Result Value Ref Range    Troponin-I 0.00 0.00 - 0.07 ng/ml   CBC WITH AUTOMATED DIFF    Collection Time: 12/26/15  1:08 AM   Result Value Ref Range    WBC 3.3 (L) 4.0 - 11.0 1000/mm3    RBC 3.79 3.60 - 5.20 M/uL    HGB 13.1 13.0 - 17.2 gm/dl    HCT 38.9 37.0 - 50.0 %    MCV 102.6 (H) 80.0 - 98.0 fL    MCH 34.6 25.4 - 34.6 pg    MCHC 33.7 30.0 - 36.0 gm/dl    PLATELET 204 140 - 450 1000/mm3    MPV 9.1 6.0 - 10.0 fL    RDW-SD 47.8 (H) 36.4 - 46.3      NRBC 0 0 - 0      IMMATURE GRANULOCYTES 0.3 0.0 - 3.0 %    NEUTROPHILS 27.8 (L) 34 - 64 %    LYMPHOCYTES 59.2 (H) 28 - 48 %    MONOCYTES 10.3 1 - 13 %    EOSINOPHILS 2.1 0 - 5 %    BASOPHILS 0.3 0 - 3 %   D DIMER    Collection Time: 12/26/15  1:59 AM   Result Value Ref Range    D DIMER 0.50 0.01 - 0.50 ug/mL (FEU)    EKG is normal sinus rhythm with no  ST elevation or depression  Chest x-ray show no acute carpal pulmonary disease  And CTA of the chest was negative for pulmonary embolus  Imaging:    No results found.  ED Course   Patient remained clinically stable throughout the Emergency Department  visit.  Vitals were unremarkable and the patient's condition required no further ED intervention.  The patient is stable for outpatient management with appropriate symptomatic treatment, return precautions, and was advised of the importance of outpatient follow up for ongoing care.    Medical Decision Making     Final Diagnosis       ICD-10-CM ICD-9-CM   1. Pleurisy R09.1 511.0       Disposition   Home  Take anti-inflammatories  And muscle relaxers for the pain    The patient was personally evaluated by myself and Dr. George Hugh who agrees with the above assessment and plan      Coral Ceo, Echo  December 26, 2015    My signature above authenticates this document and my orders, the final ??  diagnosis (es), discharge prescription (s), and instructions in the Epic ??  record.  If you have any questions please contact [phone].  ??  Nursing notes have been reviewed by the physician/ advanced practice ??  Clinician.

## 2015-12-26 NOTE — Other (Signed)
This was not an over read. While reviewing the patient's chest CTA it was noted that Dr. Pricilla Riffle of radiology saw a slight asymmetric decrease in the size of the left breast implant. Evaluate for possible rupture. I called and I spoke with the patient and I read her this result I advised her to follow-up with her breast surgeon so that they may review our films and continue outpatient treatment. Patient verbally agreed with this and said that she would call them.

## 2016-12-05 DIAGNOSIS — D126 Benign neoplasm of colon, unspecified: Secondary | ICD-10-CM

## 2016-12-05 HISTORY — DX: Benign neoplasm of colon, unspecified: D12.6

## 2017-09-07 ENCOUNTER — Inpatient Hospital Stay: Admit: 2017-09-07 | Payer: PRIVATE HEALTH INSURANCE | Primary: Family Medicine

## 2018-05-24 ENCOUNTER — Encounter

## 2018-05-31 ENCOUNTER — Inpatient Hospital Stay: Admit: 2018-05-31 | Payer: PRIVATE HEALTH INSURANCE | Attending: Specialist | Primary: Family Medicine

## 2018-05-31 DIAGNOSIS — Z853 Personal history of malignant neoplasm of breast: Secondary | ICD-10-CM

## 2019-01-30 ENCOUNTER — Encounter

## 2019-02-06 ENCOUNTER — Inpatient Hospital Stay: Admit: 2019-02-06 | Payer: BLUE CROSS/BLUE SHIELD | Attending: Family Medicine | Primary: Family Medicine

## 2019-02-06 DIAGNOSIS — I6782 Cerebral ischemia: Secondary | ICD-10-CM

## 2019-12-06 HISTORY — PX: LEFT HEART CATH AND CORONARY ANGIOGRAPHY: CATH118249

## 2019-12-06 HISTORY — PX: TRANSTHORACIC ECHOCARDIOGRAM: SHX275

## 2020-06-12 ENCOUNTER — Encounter

## 2020-06-16 ENCOUNTER — Inpatient Hospital Stay: Admit: 2020-06-16 | Payer: MEDICARE | Attending: Specialist | Primary: Family Medicine

## 2020-06-16 DIAGNOSIS — Z780 Asymptomatic menopausal state: Secondary | ICD-10-CM

## 2020-06-26 DIAGNOSIS — D0511 Intraductal carcinoma in situ of right breast: Secondary | ICD-10-CM | POA: Insufficient documentation

## 2020-06-26 DIAGNOSIS — Z170 Estrogen receptor positive status [ER+]: Secondary | ICD-10-CM | POA: Insufficient documentation

## 2020-07-02 DIAGNOSIS — D704 Cyclic neutropenia: Secondary | ICD-10-CM | POA: Insufficient documentation

## 2020-07-05 HISTORY — PX: NM MYOVIEW LTD: HXRAD82

## 2020-07-14 HISTORY — PX: LEFT HEART CATH AND CORONARY ANGIOGRAPHY: CATH118249

## 2020-08-28 ENCOUNTER — Inpatient Hospital Stay: Admit: 2020-08-28 | Discharge: 2020-08-28 | Payer: MEDICARE | Primary: Family Medicine

## 2020-08-28 DIAGNOSIS — R9439 Abnormal result of other cardiovascular function study: Secondary | ICD-10-CM

## 2020-08-28 LAB — CBC WITH AUTOMATED DIFF
BASOPHILS: 0.3 % (ref 0–3)
EOSINOPHILS: 1 % (ref 0–5)
HCT: 39.3 % (ref 37.0–50.0)
HGB: 13.1 gm/dl (ref 13.0–17.2)
IMMATURE GRANULOCYTES: 0.3 % (ref 0.0–3.0)
LYMPHOCYTES: 38.6 % (ref 28–48)
MCH: 34.8 pg — ABNORMAL HIGH (ref 25.4–34.6)
MCHC: 33.3 gm/dl (ref 30.0–36.0)
MCV: 104.5 fL — ABNORMAL HIGH (ref 80.0–98.0)
MONOCYTES: 10.8 % (ref 1–13)
MPV: 10.8 fL — ABNORMAL HIGH (ref 6.0–10.0)
NEUTROPHILS: 49 % (ref 34–64)
NRBC: 0 (ref 0–0)
PLATELET: 176 10*3/uL (ref 140–450)
RBC: 3.76 M/uL (ref 3.60–5.20)
RDW-SD: 50.1 — ABNORMAL HIGH (ref 36.4–46.3)
WBC: 3.1 10*3/uL — ABNORMAL LOW (ref 4.0–11.0)

## 2020-08-28 LAB — METABOLIC PANEL, COMPREHENSIVE
ALT (SGPT): 36 U/L (ref 12–78)
AST (SGOT): 22 U/L (ref 15–37)
Albumin: 3.6 gm/dl (ref 3.4–5.0)
Alk. phosphatase: 75 U/L (ref 45–117)
Anion gap: 3 mmol/L — ABNORMAL LOW (ref 5–15)
BUN: 9 mg/dl (ref 7–25)
Bilirubin, total: 1.2 mg/dl — ABNORMAL HIGH (ref 0.2–1.0)
CO2: 29 mEq/L (ref 21–32)
Calcium: 9.4 mg/dl (ref 8.5–10.1)
Chloride: 111 mEq/L — ABNORMAL HIGH (ref 98–107)
Creatinine: 0.7 mg/dl (ref 0.6–1.3)
GFR est AA: >60
GFR est non-AA: >60
Glucose: 85 mg/dl (ref 74–106)
Potassium: 4.3 mEq/L (ref 3.5–5.1)
Protein, total: 7.2 gm/dl (ref 6.4–8.2)
Sodium: 142 mEq/L (ref 136–145)

## 2020-08-28 LAB — COMPREHENSIVE METABOLIC PANEL
ALT: 36 U/L (ref 12–78)
AST: 22 U/L (ref 15–37)
Albumin: 3.6 gm/dl (ref 3.4–5.0)
Alkaline Phosphatase: 75 U/L (ref 45–117)
Anion Gap: 3 mmol/L — ABNORMAL LOW (ref 5–15)
BUN: 9 mg/dl (ref 7–25)
CO2: 29 mEq/L (ref 21–32)
Calcium: 9.4 mg/dl (ref 8.5–10.1)
Chloride: 111 mEq/L — ABNORMAL HIGH (ref 98–107)
Creatinine: 0.7 mg/dl (ref 0.6–1.3)
EGFR IF NonAfrican American: >60
GFR African American: >60
Glucose: 85 mg/dl (ref 74–106)
Potassium: 4.3 mEq/L (ref 3.5–5.1)
Sodium: 142 mEq/L (ref 136–145)
Total Bilirubin: 1.2 mg/dl — ABNORMAL HIGH (ref 0.2–1.0)
Total Protein: 7.2 gm/dl (ref 6.4–8.2)

## 2020-08-28 LAB — CBC WITH AUTO DIFFERENTIAL
Basophils %: 0.3 % (ref 0–3)
Eosinophils %: 1 % (ref 0–5)
Hematocrit: 39.3 % (ref 37.0–50.0)
Hemoglobin: 13.1 gm/dl (ref 13.0–17.2)
Immature Granulocytes: 0.3 % (ref 0.0–3.0)
Lymphocytes %: 38.6 % (ref 28–48)
MCH: 34.8 pg — ABNORMAL HIGH (ref 25.4–34.6)
MCHC: 33.3 gm/dl (ref 30.0–36.0)
MCV: 104.5 fL — ABNORMAL HIGH (ref 80.0–98.0)
MPV: 10.8 fL — ABNORMAL HIGH (ref 6.0–10.0)
Monocytes %: 10.8 % (ref 1–13)
Neutrophils %: 49 % (ref 34–64)
Nucleated RBCs: 0 (ref 0–0)
Platelets: 176 10*3/uL (ref 140–450)
RBC: 3.76 M/uL (ref 3.60–5.20)
RDW-SD: 50.1 — ABNORMAL HIGH (ref 36.4–46.3)
WBC: 3.1 10*3/uL — ABNORMAL LOW (ref 4.0–11.0)

## 2020-09-03 ENCOUNTER — Inpatient Hospital Stay: Admit: 2020-09-03 | Payer: MEDICARE | Attending: Cardiovascular Disease | Primary: Family Medicine

## 2020-09-03 DIAGNOSIS — R0789 Other chest pain: Secondary | ICD-10-CM

## 2020-09-03 LAB — POC PT/INR
INR: 1.1 (ref 0.0–1.1)
Prothrombin time: 13.1 seconds (ref 0.0–14.0)

## 2020-09-03 LAB — POCT PT/INR
INR: 1.1 (ref 0.0–1.1)
Protime: 13.1 seconds (ref 0.0–14.0)

## 2020-09-03 MED ORDER — HEPARIN (PORCINE) 1,000 UNIT/ML IJ SOLN
1000 unit/mL | INTRAMUSCULAR | Status: DC | PRN
Start: 2020-09-03 — End: 2020-09-03

## 2020-09-03 MED ORDER — MIDAZOLAM 1 MG/ML IJ SOLN
1 mg/mL | INTRAMUSCULAR | Status: DC | PRN
Start: 2020-09-03 — End: 2020-09-03
  Administered 2020-09-03 (×2): via INTRAVENOUS

## 2020-09-03 MED ORDER — HEPARIN (PORCINE) IN NS (PF) 1,000 UNIT/500 ML IV
1000 unit/500 mL | Freq: Once | INTRAVENOUS | Status: AC
Start: 2020-09-03 — End: 2020-09-03
  Administered 2020-09-03: 13:00:00

## 2020-09-03 MED ORDER — SODIUM CHLORIDE 0.9 % IJ SYRG
Freq: Three times a day (TID) | INTRAMUSCULAR | Status: DC
Start: 2020-09-03 — End: 2020-09-03

## 2020-09-03 MED ORDER — FENTANYL CITRATE (PF) 50 MCG/ML IJ SOLN
50 mcg/mL | INTRAMUSCULAR | Status: DC | PRN
Start: 2020-09-03 — End: 2020-09-03
  Administered 2020-09-03 (×2): via INTRAVENOUS

## 2020-09-03 MED ORDER — SODIUM CHLORIDE 0.9 % IJ SYRG
INTRAMUSCULAR | Status: DC | PRN
Start: 2020-09-03 — End: 2020-09-03

## 2020-09-03 MED ORDER — NITROGLYCERIN 0.5 MG/ 10 ML (50 MCG/ML COMPOUNDED INJECTION)
0.5 mg/ 10mL | INTRAVENOUS | Status: DC | PRN
Start: 2020-09-03 — End: 2020-09-03
  Administered 2020-09-03: 14:00:00 via INTRACORONARY

## 2020-09-03 MED ORDER — IOVERSOL 320 MG/ML IV SOLN
320 mg iodine/mL | INTRAVENOUS | Status: DC | PRN
Start: 2020-09-03 — End: 2020-09-03
  Administered 2020-09-03: 14:00:00 via INTRA_ARTERIAL

## 2020-09-03 MED ORDER — HEPARIN (PORCINE) 1,000 UNIT/ML IJ SOLN
1000 unit/mL | INTRAMUSCULAR | Status: DC | PRN
Start: 2020-09-03 — End: 2020-09-03
  Administered 2020-09-03: 14:00:00

## 2020-09-03 MED FILL — HEPARIN (PORCINE) IN NS (PF) 1,000 UNIT/500 ML IV: 1000 unit/500 mL | INTRAVENOUS | Qty: 1000

## 2020-09-03 MED FILL — MIDAZOLAM 1 MG/ML IJ SOLN: 1 mg/mL | INTRAMUSCULAR | Qty: 2

## 2020-09-03 MED FILL — OPTIRAY 320 MG IODINE/ML INTRAVENOUS SOLUTION: 320 mg iodine/mL | INTRAVENOUS | Qty: 300

## 2020-09-03 MED FILL — FENTANYL CITRATE (PF) 50 MCG/ML IJ SOLN: 50 mcg/mL | INTRAMUSCULAR | Qty: 2

## 2020-09-03 MED FILL — HEPARIN (PORCINE) 1,000 UNIT/ML IJ SOLN: 1000 unit/mL | INTRAMUSCULAR | Qty: 10

## 2020-09-03 MED FILL — NITROGLYCERIN 0.5 MG/ 10 ML (50 MCG/ML COMPOUNDED INJECTION): 0.5 mg/ 10mL | INTRAVENOUS | Qty: 20

## 2020-09-03 NOTE — Progress Notes (Signed)
Pt seen immediately before the procedure  Explained the procedure  All questions answered

## 2020-09-03 NOTE — Procedures (Signed)
Cardiac Cathetherization Note:    PreOpDiagnosis: chest pain and abnormal NST    Findings/PostOp Diagnosis:  1. No significant obstructive lesions    Plan:  1. Medical management  2. Risk factor control      Procedure status: Elective,  Indication:  Abnormal Stress Test,Angina  Risk Factors:  Hypertension,Dyslipidemia,  Anti-anginals:  Beta blocker,   Procedures:  LHC and  Selective Bilateral Coronary Angiography (JL4 and JR4)   Cinefluoroscopy    Hemodynamics:  Not measured    LVGram: not done. Normal echo 07/14/20        Cors:  Dominance: right   LM: patent  LAD: mild calcification. 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 Small vessel.  LCX: Patent with patent OMs  RCA: Patent tortuous    Closure: Perclose per Dr Algis Downs who did FFR on LAD.    Estimated Blood Loss: Minimal  Specimens: None  Assistants: Per [facility]  Contrast: 70 ml ( for diagnostic procedure)  Fluoro Time: 0.8 min  Complications: None      Oneita Hurt, MD  September 03, 2020  10:08 AM

## 2020-09-03 NOTE — Progress Notes (Signed)
 DISCHARGE SUMMARY from Nurse  ?  ?  PATIENT INSTRUCTIONS:  ?  Notify you Physician if you experience:  Increased Shortness of Breath   Dizziness   Fainting   Black Stools   Vomiting   Coughing White, Frothy or Bloody Sputum   Dry cough that will not go away   Increased swelling of arms, legs, ankles or abdomen   Develop a rash   Difficulty breathing while laying down   Urine problems: pain, burning, urgency or difficulty   Temperature greater than 100 degrees F, shaking, chills for more than 24 hours   Increased skin bruising   Sore Mouth, throat or gums   Increased cough, fatigue   Clicking/popping sound in a joint   Increased limb shortening or turning outward   ?  ?  Call you Doctor right away if you have new symptoms such as:  Cough that is worse at night and when you are lying down   Swelling in your legs, ankles, feet, abdomen and/or veins in the neck   ?  Lifestyle Tips:  Appointment after discharge and follow up phone call:  After being discharged, if your appointment does not work for you, please feel free to contact the physician's office to change the appointment.  We want to make sure you are doing well after you have left the hospital. You will receive a phone call on behalf of [facility] to follow up with you within 24-72 hours of your discharge from the hospital.  This phone call will help us  provide you with the best possible care. Please take the time to answer all questions you are asked so we can help you stay safe in your home before your next doctor's appointment.  ?  Medications:  Take your medicines exactly as instructed.  Ask your doctor or nurse if you have questions about your medicines.  Tell your doctor right away if you have problems with your medicines.  Activity:  Ask your doctor how active you should be. Remember to take rest breaks.  Do not cross your legs when sitting.  Elevate your legs when you can.  Diet:  Follow any special diet orders from your  doctor.  Ask your doctor how much salt (sodium) you should have each day.  Ask if you need to limit the amount or types of fluids you drink each day.  Weight Monitoring:  If you are overweight, ask about a weight loss plan that is right for you.  Weight gain may mean that fluids are building up in your body.  Weigh yourself every day at about the same time and write it down.  Do Not Smoke:  If you smoke, quit. Smoking is bad for your health.  Avoid second hand smoke.  Call the [facility]  QUITLINE for help at 925-469-4423 (1-800-Quit - Now)  Other Tips:  Avoid people with the flu or pneumonia  Keep all of your doctor appointments  Carry a list of your medications, allergies and the shots you have had  ?  ?  ?  These are general instructions for a healthy lifestyle:  ?  No smoking/ No tobacco products/ Avoid exposure to second hand smoke  ?  Surgeon General's Warning: Quitting smoking now greatly reduces serious risk to your health.  ?  Obesity, smoking, and sedentary lifestyle greatly increases your risk for illness  ?  A healthy diet, regular physical exercise & weight monitoring are important for maintaining a healthy lifestyle  ?  You may be retaining fluid if you have a history of heart failure or if you experience any of the following symptoms: Weight gain of 3 pounds or more overnight or 5 pounds in a week, increased swelling in our hands or feet or shortness of breath while lying flat in bed. Please call your doctor as soon as you notice any of these symptoms; do not wait until your next office visit.  ?  Recognize signs and symptoms of STROKE:  ?  F-face looks uneven  ?  A-arms unable to move or move unevenly  ?  S-speech slurred or non-existent  ?  T-time-call 911 as soon as signs and symptoms begin-DO NOT go   Back to bed or wait to see if you get better-TIME IS BRAIN.  ?  Warning Signs of HEART ATTACK   ?  Call 911 if you have these symptoms:  Chest discomfort. Most heart attacks involve discomfort in the  center of the chest that lasts more than a few minutes, or that goes away and comes back. It can feel like uncomfortable pressure, squeezing, fullness, or pain.   Discomfort in other areas of the upper body. Symptoms can include pain or discomfort in one or both arms, the back, neck, jaw, or stomach.   Shortness of breath with or without chest discomfort.   Other signs may include breaking out in a cold sweat, nausea, or lightheadedness.   Don't wait more than five minutes to call 911 - MINUTES MATTER! Fast action can save your life. Calling 911 is almost always the fastest way to get lifesaving treatment. Emergency Medical Services staff can begin treatment when they arrive -- up to an hour sooner than if someone gets to the hospital by car.   Myself and/or my family have received education about my diagnosis throughout my hospital stay. During my hospital stay, I and/or my family/caregiver were included in planning my care upon discharge. My needs were taken into consideration and I was included in my discharge planning. I had an opportunity to ask questions.   The discharge information has been reviewed with the patient. The patient verbalized understanding.  ?  ?  Discharge medications reviewed with the patient and appropriate educational materials and side effects teaching were provided. Pt left A&Ox4, VSS with all belongings. Pt left via wheelchair accompanied by cath lab holding staff and daughter, Rexene.

## 2021-04-19 ENCOUNTER — Inpatient Hospital Stay: Admit: 2021-04-19 | Discharge: 2021-04-19 | Payer: MEDICARE | Primary: Family Medicine

## 2021-04-19 DIAGNOSIS — E785 Hyperlipidemia, unspecified: Secondary | ICD-10-CM

## 2021-04-19 LAB — METABOLIC PANEL, COMPREHENSIVE
ALT (SGPT): 23 U/L (ref 10–49)
AST (SGOT): 25 U/L (ref 0.0–33.9)
Albumin: 2.4 gm/dl — ABNORMAL LOW (ref 3.4–5.0)
Alk. phosphatase: 66 U/L (ref 46–116)
Anion gap: 7 mmol/L (ref 5–15)
BUN: 9 mg/dl (ref 9–23)
Bilirubin, total: 0.6 mg/dl (ref 0.30–1.20)
CO2: 28 mEq/L (ref 20–31)
Calcium: 9.4 mg/dl (ref 8.7–10.4)
Chloride: 107 mEq/L (ref 98–107)
Creatinine: 0.73 mg/dl (ref 0.55–1.02)
GFR est AA: >60
GFR est non-AA: >60
Glucose: 93 mg/dl (ref 74–106)
Potassium: 3.9 mEq/L (ref 3.4–4.5)
Protein, total: 6.4 gm/dl (ref 5.7–8.2)
Sodium: 142 mEq/L (ref 136–145)

## 2021-04-19 LAB — LIPID PANEL
CHOL/HDL Ratio: 2.7 Ratio (ref 0.0–4.4)
Chol/HDL Ratio: 2.7 Ratio (ref 0.0–4.4)
Cholesterol, Total: 169 mg/dl (ref 0–199)
Cholesterol, total: 169 mg/dl (ref 0–199)
HDL Cholesterol: 63 mg/dl — ABNORMAL HIGH (ref 40–60)
HDL: 63 mg/dl — ABNORMAL HIGH (ref 40–60)
LDL Calculated: 96 mg/dl (ref 0–130)
LDL, calculated: 96 mg/dl (ref 0–130)
Triglyceride: 48 mg/dl (ref 0–150)
Triglycerides: 48 mg/dl (ref 0–150)

## 2021-04-19 LAB — COMPREHENSIVE METABOLIC PANEL
ALT: 23 U/L (ref 10–49)
AST: 25 U/L (ref 0.0–33.9)
Albumin: 2.4 gm/dl — ABNORMAL LOW (ref 3.4–5.0)
Alkaline Phosphatase: 66 U/L (ref 46–116)
Anion Gap: 7 mmol/L (ref 5–15)
BUN: 9 mg/dl (ref 9–23)
CO2: 28 mEq/L (ref 20–31)
Calcium: 9.4 mg/dl (ref 8.7–10.4)
Chloride: 107 mEq/L (ref 98–107)
Creatinine: 0.73 mg/dl (ref 0.55–1.02)
EGFR IF NonAfrican American: >60
GFR African American: >60
Glucose: 93 mg/dl (ref 74–106)
Potassium: 3.9 mEq/L (ref 3.4–4.5)
Sodium: 142 mEq/L (ref 136–145)
Total Bilirubin: 0.6 mg/dl (ref 0.30–1.20)
Total Protein: 6.4 gm/dl (ref 5.7–8.2)

## 2021-07-13 ENCOUNTER — Encounter: Payer: Self-pay | Admitting: Student

## 2021-07-16 ENCOUNTER — Emergency Department (HOSPITAL_COMMUNITY): Payer: Medicare Other

## 2021-07-16 ENCOUNTER — Encounter (HOSPITAL_COMMUNITY): Payer: Self-pay | Admitting: Emergency Medicine

## 2021-07-16 ENCOUNTER — Emergency Department (HOSPITAL_COMMUNITY)
Admission: EM | Admit: 2021-07-16 | Discharge: 2021-07-17 | Disposition: A | Discharge status: Home / Self Care | Payer: Medicare Other | Attending: Student | Admitting: Student

## 2021-07-16 ENCOUNTER — Other Ambulatory Visit: Payer: Self-pay

## 2021-07-16 DIAGNOSIS — R0602 Shortness of breath: Secondary | ICD-10-CM | POA: Insufficient documentation

## 2021-07-16 DIAGNOSIS — D72819 Decreased white blood cell count, unspecified: Secondary | ICD-10-CM | POA: Diagnosis not present

## 2021-07-16 DIAGNOSIS — R0789 Other chest pain: Secondary | ICD-10-CM | POA: Diagnosis not present

## 2021-07-16 DIAGNOSIS — Z859 Personal history of malignant neoplasm, unspecified: Secondary | ICD-10-CM | POA: Diagnosis not present

## 2021-07-16 DIAGNOSIS — R079 Chest pain, unspecified: Secondary | ICD-10-CM | POA: Diagnosis not present

## 2021-07-16 DIAGNOSIS — R072 Precordial pain: Secondary | ICD-10-CM | POA: Diagnosis not present

## 2021-07-16 DIAGNOSIS — R7989 Other specified abnormal findings of blood chemistry: Secondary | ICD-10-CM | POA: Diagnosis not present

## 2021-07-16 HISTORY — DX: Malignant (primary) neoplasm, unspecified: C80.1

## 2021-07-16 LAB — CBC
HCT: 39.9 % (ref 36.0–46.0)
Hemoglobin: 13.4 g/dL (ref 12.0–15.0)
MCH: 36.1 pg — ABNORMAL HIGH (ref 26.0–34.0)
MCHC: 33.6 g/dL (ref 30.0–36.0)
MCV: 107.5 fL — ABNORMAL HIGH (ref 80.0–100.0)
Platelets: 183 10*3/uL (ref 150–400)
RBC: 3.71 MIL/uL — ABNORMAL LOW (ref 3.87–5.11)
RDW: 13 % (ref 11.5–15.5)
WBC: 3.5 10*3/uL — ABNORMAL LOW (ref 4.0–10.5)
nRBC: 0 % (ref 0.0–0.2)

## 2021-07-16 LAB — D-DIMER, QUANTITATIVE: D-Dimer, Quant: 1.94 ug/mL-FEU — ABNORMAL HIGH (ref 0.00–0.50)

## 2021-07-16 LAB — BASIC METABOLIC PANEL
Anion gap: 8 (ref 5–15)
BUN: 16 mg/dL (ref 8–23)
CO2: 29 mmol/L (ref 22–32)
Calcium: 9.7 mg/dL (ref 8.9–10.3)
Chloride: 105 mmol/L (ref 98–111)
Creatinine, Ser: 0.81 mg/dL (ref 0.44–1.00)
GFR, Estimated: >60 mL/min (ref >60)
Glucose, Bld: 99 mg/dL (ref 70–99)
Potassium: 4.1 mmol/L (ref 3.5–5.1)
Sodium: 142 mmol/L (ref 135–145)

## 2021-07-16 LAB — TROPONIN I (HIGH SENSITIVITY)
Troponin I (High Sensitivity): 4 ng/L (ref <18)
Troponin I (High Sensitivity): 5 ng/L (ref <18)

## 2021-07-16 LAB — BRAIN NATRIURETIC PEPTIDE: B Natriuretic Peptide: 52.9 pg/mL (ref 0.0–100.0)

## 2021-07-16 MED ORDER — IOHEXOL 350 MG/ML SOLN
80.0000 mL | Freq: Once | INTRAVENOUS | Status: AC | PRN
  Administered 2021-07-16: 80 mL via INTRAVENOUS

## 2021-07-16 MED ORDER — ALUM & MAG HYDROXIDE-SIMETH 200-200-20 MG/5ML PO SUSP
30.0000 mL | Freq: Once | ORAL | Status: AC
  Administered 2021-07-17: 30 mL via ORAL
  Filled 2021-07-16: qty 30

## 2021-07-16 NOTE — ED Triage Notes (Signed)
Patient here from home reporting chest pain central x4 days. Unsure if related to new med start at same time Ezetimibe.

## 2021-07-16 NOTE — ED Provider Notes (Signed)
Emergency Medicine Provider Triage Evaluation Note  Caitlin Parks , a 75 y.o. female  was evaluated in triage.  Pt complains of chest pain.  Has been present for to 5 days.  Constant, but worse when she lays down at night.  No increased pain with inspiration.  Review of Systems  Positive: cp Negative: sob  Physical Exam  BP (!) 174/78 (BP Location: Left Arm)   Pulse (!) 59   Temp 98 F (36.7 C) (Oral)   Resp 18   SpO2 100%  Gen:   Awake, no distress   Resp:  Normal effort  MSK:   Moves extremities without difficulty    Medical Decision Making  Medically screening exam initiated at 7:04 PM.  Appropriate orders placed.  Caitlin Parks was informed that the remainder of the evaluation will be completed by another provider, this initial triage assessment does not replace that evaluation, and the importance of remaining in the ED until their evaluation is complete.  Labs, ekg, cxr   Franchot Heidelberg, PA-C 07/16/21 Pyatt, [place], MD 07/17/21 [phone]

## 2021-07-16 NOTE — ED Provider Notes (Signed)
[facility] DEPT Provider Note   CSN: [id number] Arrival date & time: 07/16/21  1758     History Chief Complaint  Patient presents with   Chest Pain    Caitlin Parks is a 75 y.o. 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  She states that she has had chest pain for about 5 days that feels like a tightness.  She says that it is associated with shortness of breath that is worsened when she lies flat at night.  She states that she has been dealing with orthopnea for approximately 1 year but the chest tightness is new over the last 5 days.  Denies associated nausea, vomiting, diaphoresis.  Denies abdominal pain, fever, cough, headache or any other systemic symptoms.   Chest Pain Associated symptoms: shortness of breath   Associated symptoms: no abdominal pain, no back pain, no cough, no fever, no palpitations and no vomiting       Past Medical History:  Diagnosis Date   Cancer ([place])     There are no problems to display for this patient.   History reviewed. No pertinent surgical history.   OB History   No obstetric history on file.     No family history on file.  Social History   Tobacco Use   Smoking status: Never   Smokeless tobacco: Never  Substance Use Topics   Alcohol use: Never   Drug use: Never    Home Medications Prior to Admission medications   Not on File    Allergies    Patient has no allergy information on record.  Review of Systems   Review of Systems  Constitutional:  Negative for chills and fever.  HENT:  Negative for ear pain and sore throat.   Eyes:  Negative for pain and visual disturbance.  Respiratory:  Positive for chest tightness and shortness of breath. Negative for cough.   Cardiovascular:  Positive for chest pain. Negative for palpitations.  Gastrointestinal:  Negative for  abdominal pain and vomiting.  Genitourinary:  Negative for dysuria and hematuria.  Musculoskeletal:  Negative for arthralgias and back pain.  Skin:  Negative for color change and rash.  Neurological:  Negative for seizures and syncope.  All other systems reviewed and are negative.  Physical Exam Updated Vital Signs BP (!) 142/71   Pulse (!) 57   Temp 98 F (36.7 C) (Oral)   Resp 16   Ht '5\' 6"'$  (1.676 m)   Wt 65.8 kg   SpO2 100%   BMI 23.40 kg/m   Physical Exam Vitals and nursing note reviewed.  Constitutional:      General: She is not in acute distress.    Appearance: She is well-developed.  HENT:     Head: Normocephalic and atraumatic.  Eyes:     Conjunctiva/sclera: Conjunctivae normal.  Cardiovascular:     Rate and Rhythm: Normal rate and regular rhythm.     Heart sounds: No murmur heard. Pulmonary:     Effort: Pulmonary effort is normal. No respiratory distress.     Breath sounds: Normal breath sounds.  Abdominal:     Palpations: Abdomen is soft.     Tenderness: There is no abdominal tenderness.  Musculoskeletal:     Cervical back: Neck supple.  Skin:    General: Skin is warm and dry.  Neurological:     Mental Status: She is alert.  ED Results / Procedures / Treatments   Labs (all labs ordered are listed, but only abnormal results are displayed) Labs Reviewed  CBC - Abnormal; Notable for the following components:      Result Value   WBC 3.5 (*)    RBC 3.71 (*)    MCV 107.5 (*)    MCH 36.1 (*)    All other components within normal limits  D-DIMER, QUANTITATIVE - Abnormal; Notable for the following components:   D-Dimer, Quant 1.94 (*)    All other components within normal limits  BASIC METABOLIC PANEL  BRAIN NATRIURETIC PEPTIDE  TROPONIN I (HIGH SENSITIVITY)  TROPONIN I (HIGH SENSITIVITY)    EKG EKG Interpretation  Date/Time:  Friday July 16 2021 18:17:39 EDT Ventricular Rate:  58 PR Interval:  174 QRS Duration: 121 QT Interval:  453 QTC  Calculation: 445 R Axis:   -3 Text Interpretation: Sinus rhythm Nonspecific intraventricular conduction delay wavy basline, no ST elevations or depressions, no evidence of ischemia Confirmed by [place] ([id number]) on 07/16/2021 11:41:49 PM  Radiology DG Chest 2 View  Result Date: 07/16/2021 CLINICAL DATA:  Chest pain EXAM: CHEST - 2 VIEW COMPARISON:  None. FINDINGS: Lungs are clear.  No pleural effusion or pneumothorax. The heart is normal in size. Visualized osseous structures are within normal limits. IMPRESSION: Normal chest radiographs. Electronically Signed   By: Julian Hy M.D.   On: 07/16/2021 19:30    Procedures Procedures   Medications Ordered in ED Medications  alum & mag hydroxide-simeth (MAALOX/MYLANTA) 200-200-20 MG/5ML suspension 30 mL (has no administration in time range)  iohexol (OMNIPAQUE) 350 MG/ML injection 80 mL (80 mLs Intravenous Contrast Given 07/16/21 2246)    ED Course  I have reviewed the triage vital signs and the nursing notes.  Pertinent labs & imaging results that were available during my care of the patient were reviewed by me and considered in my medical decision making (see chart for details).    MDM Rules/Calculators/A&P                           Patient seen emergency department for evaluation of chest pain.  Physical exam is unremarkable.  Laboratory evaluation reveals chronic leukopenia to 3.5, negative troponin and delta troponin, chemistry unremarkable, D-dimer elevated to 1.94, BNP unremarkable.  CT PE currently pending.  Chest x-ray unremarkable.  Patient then signed out to oncoming provider.  Please see provider signout note for continuation of work-up. Final Clinical Impression(s) / ED Diagnoses Final diagnoses:  None    Rx / DC Orders ED Discharge Orders     None        Creola Krotz, Debe Coder, MD 07/16/21 2342

## 2021-07-17 ENCOUNTER — Emergency Department (HOSPITAL_COMMUNITY): Payer: Medicare Other

## 2021-07-17 DIAGNOSIS — R7989 Other specified abnormal findings of blood chemistry: Secondary | ICD-10-CM | POA: Diagnosis not present

## 2021-07-17 DIAGNOSIS — R079 Chest pain, unspecified: Secondary | ICD-10-CM | POA: Diagnosis not present

## 2021-07-17 NOTE — ED Provider Notes (Signed)
I assumed care at signout to follow-up on imaging.  CT chest negative.  Patient reports felt improved.  Work-up otherwise unremarkable.  Patient declines admission and would like to be discharged.  We will give her referrals for outpatient cardiology follow-up since she has relocated to [facility] We discussed strict return precautions   Ripley Fraise, MD 07/17/21 [phone]

## 2021-07-17 NOTE — Discharge Instructions (Addendum)

## 2021-07-28 DIAGNOSIS — M79629 Pain in unspecified upper arm: Secondary | ICD-10-CM | POA: Diagnosis not present

## 2021-09-01 DIAGNOSIS — J301 Allergic rhinitis due to pollen: Secondary | ICD-10-CM | POA: Diagnosis not present

## 2021-09-01 DIAGNOSIS — C50911 Malignant neoplasm of unspecified site of right female breast: Secondary | ICD-10-CM | POA: Diagnosis not present

## 2021-09-01 DIAGNOSIS — I10 Essential (primary) hypertension: Secondary | ICD-10-CM | POA: Diagnosis not present

## 2021-09-01 DIAGNOSIS — E042 Nontoxic multinodular goiter: Secondary | ICD-10-CM | POA: Diagnosis not present

## 2021-09-01 DIAGNOSIS — I2510 Atherosclerotic heart disease of native coronary artery without angina pectoris: Secondary | ICD-10-CM | POA: Diagnosis not present

## 2021-09-01 DIAGNOSIS — M85859 Other specified disorders of bone density and structure, unspecified thigh: Secondary | ICD-10-CM | POA: Diagnosis not present

## 2021-09-01 DIAGNOSIS — D126 Benign neoplasm of colon, unspecified: Secondary | ICD-10-CM | POA: Diagnosis not present

## 2021-09-01 DIAGNOSIS — K52839 Microscopic colitis, unspecified: Secondary | ICD-10-CM | POA: Diagnosis not present

## 2021-09-01 DIAGNOSIS — K2100 Gastro-esophageal reflux disease with esophagitis, without bleeding: Secondary | ICD-10-CM | POA: Diagnosis not present

## 2021-09-01 DIAGNOSIS — E785 Hyperlipidemia, unspecified: Secondary | ICD-10-CM | POA: Diagnosis not present

## 2021-09-01 DIAGNOSIS — D709 Neutropenia, unspecified: Secondary | ICD-10-CM | POA: Diagnosis not present

## 2021-09-03 ENCOUNTER — Telehealth: Payer: Self-pay

## 2021-09-03 NOTE — Telephone Encounter (Signed)
NOTES ON FILE FROM  DR Leeroy Cha [phone], SENT REFERRAL TO SCHEDULING

## 2021-09-06 ENCOUNTER — Telehealth: Payer: Self-pay

## 2021-09-06 NOTE — Telephone Encounter (Signed)
NOTES ON FILE FROM DR RUPASHREE VARADARAJAN [phone] SENT REFERRAL TO SCHEDULING

## 2021-09-24 DIAGNOSIS — H6981 Other specified disorders of Eustachian tube, right ear: Secondary | ICD-10-CM | POA: Diagnosis not present

## 2021-09-24 DIAGNOSIS — J019 Acute sinusitis, unspecified: Secondary | ICD-10-CM | POA: Diagnosis not present

## 2021-10-18 ENCOUNTER — Other Ambulatory Visit: Payer: Self-pay

## 2021-10-18 ENCOUNTER — Ambulatory Visit: Payer: Medicare Other | Admitting: Cardiology

## 2021-10-18 ENCOUNTER — Encounter: Payer: Self-pay | Admitting: Cardiology

## 2021-10-18 DIAGNOSIS — I2510 Atherosclerotic heart disease of native coronary artery without angina pectoris: Secondary | ICD-10-CM | POA: Diagnosis not present

## 2021-10-18 DIAGNOSIS — E785 Hyperlipidemia, unspecified: Secondary | ICD-10-CM | POA: Diagnosis not present

## 2021-10-18 DIAGNOSIS — I10 Essential (primary) hypertension: Secondary | ICD-10-CM | POA: Diagnosis not present

## 2021-10-18 MED ORDER — ROSUVASTATIN CALCIUM 10 MG PO TABS: 10.0000 mg | ORAL_TABLET | Freq: Every day | ORAL | 3 refills | Status: DC

## 2021-10-18 MED ORDER — EZETIMIBE 10 MG PO TABS: 10.0000 mg | ORAL_TABLET | Freq: Every day | ORAL | 3 refills | Status: DC

## 2021-10-18 MED ORDER — METOPROLOL TARTRATE 25 MG PO TABS: 25.0000 mg | ORAL_TABLET | Freq: Two times a day (BID) | ORAL | 3 refills | Status: DC

## 2021-10-18 MED ORDER — ASPIRIN 81 MG PO TBEC: 81.0000 mg | DELAYED_RELEASE_TABLET | Freq: Every day | ORAL | 11 refills | Status: AC

## 2021-10-18 MED ORDER — LOSARTAN POTASSIUM 50 MG PO TABS: 50.0000 mg | ORAL_TABLET | Freq: Every day | ORAL | 3 refills | Status: DC

## 2021-10-18 MED ORDER — ISOSORBIDE MONONITRATE ER 30 MG PO TB24: 30.0000 mg | ORAL_TABLET | Freq: Every day | ORAL | 3 refills | Status: DC

## 2021-10-18 NOTE — Patient Instructions (Addendum)
Medication Instructions:  No changes   *If you need a refill on your cardiac medications before your next appointment, please call your pharmacy*   Lab Work:  No labs needed    Testing/Procedures:  Not needed   Follow-Up: At [facility], you and your health needs are our priority.  As part of our continuing mission to provide you with exceptional heart care, we have created designated Provider Care Teams.  These Care Teams include your primary Cardiologist (physician) and Advanced Practice Providers (APPs -  Physician Assistants and Nurse Practitioners) who all work together to provide you with the care you need, when you need it.  We recommend signing up for the patient portal called "MyChart".  Sign up information is provided on this After Visit Summary.  MyChart is used to connect with patients for Virtual Visits (Telemedicine).  Patients are able to view lab/test results, encounter notes, upcoming appointments, etc.  Non-urgent messages can be sent to your provider as well.   To learn more about what you can do with MyChart, go to [email or web address].    Your next appointment:   3 to 4 month(s) Feb or March 2023  The format for your next appointment:   In Person  Provider:   Glenetta Hew, MD     Other Instructions  Records requested  from previous doctor in [place]

## 2021-10-18 NOTE — Progress Notes (Signed)
Primary Care Provider: Leeroy Cha, [facility] Cardiologist: Glenetta Hew, MD  Previous care in [place]), [place]: PCP: Sarina Ill, MD Cardiologist: Salome Holmes, DO (phone [phone]) Heme-Onc: [facility] [place], [place], [place]  Electrophysiologist: None  Clinic Note: Chief Complaint  Patient presents with   Establish Care    Previously followed by cardiology in [facility] Patient (Initial Visit)    ===================================  ASSESSMENT/PLAN   Problem List Items Addressed This Visit       Cardiology Problems   Essential hypertension (Chronic)    Blood pressures are borderline today.  She says at home they are better less.  She is little bit stressed about coming to cardiology's office.  She really is only on modest dose of losartan and metoprolol.  We will make sure that her prescriptions are up-to-date.      Relevant Medications   losartan (COZAAR) 50 MG tablet   isosorbide mononitrate (IMDUR) 30 MG 24 hr tablet   ezetimibe (ZETIA) 10 MG tablet   metoprolol tartrate (LOPRESSOR) 25 MG tablet   rosuvastatin (CRESTOR) 10 MG tablet   aspirin 81 MG EC tablet   Hyperlipidemia with target LDL less than 100 (Chronic)    She is on rosuvastatin and Zetia.  Apparently she did have labs checked at her PCPs office while back in [place].  They are now available on care everywhere.  Reviewed.  LDL was 96 with HDL of 63.  Pretty good ratio.  We need to see with the results of her catheterization report actually indicate.  There was evidence of mild to moderate disease, would probably try to shoot for LDL closer to 70, for now stable.  We do have plenty room to titrate up rosuvastatin/Crestor, provided she tolerates.      Relevant Medications   losartan (COZAAR) 50 MG tablet   isosorbide mononitrate (IMDUR) 30 MG 24 hr tablet   ezetimibe (ZETIA) 10 MG tablet   metoprolol tartrate  (LOPRESSOR) 25 MG tablet   rosuvastatin (CRESTOR) 10 MG tablet   aspirin 81 MG EC tablet   Other Relevant Orders   EKG 12-Lead (Completed)   Coronary artery disease, non-occlusive (Chronic)    By her report, she had a heart catheterization done last year which was nonischemic with no significant disease.  Since then she has been on beta-blocker, ARB and Imdur along with statin and ezetimibe.  She also takes daily aspirin. Thankfully, no active symptoms.  We will simply try to arrange to get her stress test, echocardiogram and Results/reports reports sent to [place] for evaluation.       Relevant Medications   losartan (COZAAR) 50 MG tablet   isosorbide mononitrate (IMDUR) 30 MG 24 hr tablet   ezetimibe (ZETIA) 10 MG tablet   metoprolol tartrate (LOPRESSOR) 25 MG tablet   rosuvastatin (CRESTOR) 10 MG tablet   aspirin 81 MG EC tablet   Other Relevant Orders   EKG 12-Lead (Completed)   -> For syncopes and [place] we will prescribe refills for her cardiology medications as listed.  ===================================  HPI:    Caitlin Parks is a 75 y.o. female with history of HTN, HLD, and nonobstructive CAD by cath who is being seen today for TO ESTABLISH CARDIOLOGY CARE at the request of Varadarajan, Rupashree,*MD.  Recent Hospitalizations:  07/16/2021 -[facility] ER: Precordial pain-described as a tightness in her chest lasting about 5 days leading up to the hospital stay.  Associate with shortness of breath worse with  lying down flat.  Having having some orthopnea symptoms for over a year but the chest tightness was new over the preceding 5 days. => Evaluated with CT chest, cardiac enzymes were negative.  Referrals given for PCP and cardiology.  Min Tunnell was seen by Leeroy Cha,* MD on 09/01/2021 to establish primary care here in [place] this was actually triggered by the ER visit noted above..  She recently moved from [place], [place] on June 12, 2021, to be closer to family..  She has been pretty stable overall from a clinical standpoint.   Reviewed  CV studies:    The following studies were reviewed today: (if available, images/films reviewed: From Epic Chart or Care Everywhere) - Reports not available @ time of visit. Left Heart Cath-Cor Angio (2021): Was told that she had "slow flow, but no blockages for stents.  Placed on aspirin, Zetia and metoprolol.  CT Angio-Chest-PE 07/17/2021: No evidence of PE.  No focal infiltrate or sizable effusion.  Left renal cyst. "  Heart is enlarged".  Coronary calcification noted.  No thoracic aorta aneurysmal dilation or dissection.  Interval History:   Vayda Dungee presents here today for initial establishment clinical visit doing fairly well overall.  She has not had any more the chest pain episodes that she was having back in September.  She is otherwise doing pretty well she walks daily takes maybe a day or so often per week.  She has no more chest pain or pressure with rest or exertion.  No exertional dyspnea. As she starts getting herself more adapted to being here in [place], she is now more safe places to walk.  She is also now try to get involved dance classes and exercise classes.  She denies any PND, orthopnea or edema.  No rapid irregular heartbeats palpitations just a few skipped beats every now and then.  CV Review of Symptoms (Summary) Cardiovascular ROS: no chest pain or dyspnea on exertion negative for - edema, irregular heartbeat, loss of consciousness, murmur, orthopnea, palpitations, paroxysmal nocturnal dyspnea, rapid heart rate, or shortness of breath  REVIEWED OF SYSTEMS   Review of Systems  Constitutional:  Negative for fever, malaise/fatigue and weight loss.  HENT:  Negative for congestion and nosebleeds.   Respiratory:  Negative for cough and shortness of breath.   Cardiovascular:  Positive for chest pain. Negative for leg swelling.       PCP clinic visit note  indicates that she was having symptoms of off-and-on left side chest pain.  Has not had any further symptoms since referral was made.  Gastrointestinal:  Negative for blood in stool and melena.  Genitourinary:  Negative for dysuria and hematuria.  Musculoskeletal:  Negative for falls, joint pain and myalgias.  Neurological:  Negative for dizziness, seizures and headaches.  Psychiatric/Behavioral:  Negative for memory loss. The patient is not nervous/anxious and does not have insomnia.   All other systems reviewed and are negative.  I have reviewed and (if needed) personally updated the patient's problem list, medications, allergies, past medical and surgical history, social and family history.   PAST MEDICAL HISTORY   Past Medical History:  Diagnosis Date   Adenomatous colon polyp 2018   Breast cancer in female [facility]) 02/2013   Right-sided breast cancer-s/p right mastectomy.  No chemotherapy or radiation.   Essential hypertension    GERD (gastroesophageal reflux disease)    Hyperlipidemia LDL goal <100    Documented coronary calcification with mild CAD.   Seasonal allergies  PAST SURGICAL HISTORY   Past Surgical History:  Procedure Laterality Date   LEFT HEART CATH AND CORONARY ANGIOGRAPHY  2021   [place], [place] : no flow-limiting lesions.   MASTECTOMY Right 02/04/2013   PLACEMENT OF BREAST IMPLANTS Right 08/2013   TRANSTHORACIC ECHOCARDIOGRAM  2021   Per her report, was relatively normal.   TUBAL LIGATION  1973   Reversed in 1981   Tubal ligation reversal  1981     There is no immunization history on file for this patient.  MEDICATIONS/ALLERGIES   Current Meds  Medication Sig   albuterol (VENTOLIN HFA) 108 (90 Base) MCG/ACT inhaler Inhale into the lungs.   Cholecalciferol 25 MCG (1000 UT) tablet Take by mouth.   Cyanocobalamin 1000 MCG CAPS Take by mouth.   fexofenadine-pseudoephedrine (ALLEGRA-D 24) 180-240 MG 24 hr tablet Take 1 tablet by mouth daily.    fluticasone (FLONASE) 50 MCG/ACT nasal spray Place into the nose.   loperamide (IMODIUM A-D) 2 MG tablet Take by mouth.   Multiple Vitamins-Minerals (CENTRUM SILVER PO) Take by mouth.   pantoprazole (PROTONIX) 20 MG tablet Take by mouth.   [DISCONTINUED] aspirin 81 MG EC tablet Take by mouth.   [DISCONTINUED] ezetimibe (ZETIA) 10 MG tablet Take 10 mg by mouth at bedtime.   [DISCONTINUED] isosorbide mononitrate (IMDUR) 30 MG 24 hr tablet Take by mouth.   [DISCONTINUED] losartan (COZAAR) 50 MG tablet Take 50 mg by mouth daily.   [DISCONTINUED] metoprolol tartrate (LOPRESSOR) 25 MG tablet Take 25 mg by mouth 2 (two) times daily.   [DISCONTINUED] rosuvastatin (CRESTOR) 10 MG tablet Take by mouth.    Not on File  SOCIAL HISTORY/FAMILY HISTORY   Reviewed in Epic:   Social History   Tobacco Use   Smoking status: Never   Smokeless tobacco: Never  Substance Use Topics   Alcohol use: Yes    Alcohol/week: 1.0 standard drink    Types: 1 Glasses of wine per week   Drug use: Never   Social History   Social History Narrative   Widowed mother of 3 (1 son, 2 daughters ->    1 daughter lives here in [place] area along with her granddaughter, other daughter lives in [place] -[place] when she left [place], she first went to [place], but Subsequently Moved Back E. and Further S. to [place] to be closer to her granddaughter and daughter here locally.  Son also lives relatively close.      Caffeine: 2 servings daily-1 coffee, 1 herbal tea.      Diet: Minimizes red meat, eats lots of fruits and vegetables, chicken and fish.  Drinks lots of water (almost 80 ounces).      Exercise: Walks routinely 5 days a week, and started going to a dance class and exercise class.   Family History  Problem Relation Age of Onset   Colon cancer Mother 42   Alzheimer's disease Maternal Grandmother 25   Lung cancer Maternal Grandfather    Cancer - Colon Paternal Grandmother    Diabetes Mellitus II Paternal  Grandfather     OBJCTIVE -PE, EKG, labs   Wt Readings from Last 3 Encounters:  10/18/21 153 lb (69.4 kg)  07/16/21 145 lb (65.8 kg)    Physical Exam: BP 138/66 (BP Location: Left Arm, Patient Position: Sitting, Cuff Size: Normal)   Pulse 72   Ht _0  (1.676 m)   Wt 153 lb (69.4 kg)   SpO2 97%   BMI 24.69 kg/m  Physical Exam Vitals reviewed.  Constitutional:      Appearance: Normal appearance. She is normal weight. She is not ill-appearing.     Comments: Healthy-appearing.  Well groomed.  Well-nourished.  No acute distress.  HENT:     Head: Normocephalic and atraumatic.  Neck:     Vascular: No carotid bruit.  Cardiovascular:     Rate and Rhythm: Normal rate and regular rhythm.     Pulses: Normal pulses.     Heart sounds: Normal heart sounds. No murmur heard.   No friction rub. No gallop.  Pulmonary:     Effort: Pulmonary effort is normal. No respiratory distress.     Breath sounds: Normal breath sounds. No wheezing, rhonchi or rales.  Chest:     Chest wall: No tenderness.  Abdominal:     General: Abdomen is flat. Bowel sounds are normal. There is no distension.     Palpations: Abdomen is soft. There is no mass.     Tenderness: There is no abdominal tenderness.  Musculoskeletal:        General: No swelling. Normal range of motion.     Cervical back: Normal range of motion and neck supple.  Skin:    General: Skin is warm and dry.  Neurological:     General: No focal deficit present.     Mental Status: She is alert and oriented to person, place, and time.     Motor: No weakness.     Gait: Gait normal.  Psychiatric:        Mood and Affect: Mood normal.        Behavior: Behavior normal.        Thought Content: Thought content normal.        Judgment: Judgment normal.    Adult ECG Report  Rate: 72 ;  Rhythm: normal sinus rhythm and normal axis, intervals and durations.  Cannot rule out anterior MI, borderline EKG.  age-indeterminate. ;   Narrative Interpretation:  Borderline EKG  Recent Labs:  [place] :  04/19/2021: TC 169, TG 48, HDL 63, LDL 96.  Related to CBC with Differential Component 06/24/21 11/11/20 07/02/20  White Blood Cell 2.9 Low  2.8 Low  2.6 Low   Red Blood Cell 3.57 Low  3.69 Low  3.74 Low   Hemoglobin 12.8 12.8 12.9  Hematocrit 37.1 38.5 39.4  Mean Cell Volume 103.9 High  104.3 High  105.3 High   Mean Cell Hemoglobin 35.8 High  34.7 High  34.6 High   Mean Cell Hemoglobin Concentration 34.4 33.2 32.8  Red Cell Diameter Width 13.7 13.5 13.0  Platelet Count 210.0 211.0 212.0  Mean Platelet Volume 7.0 6.6 Low     Comprehensive Metabolic Panel Component 95/28/41  11/11/20  07/02/20   Glucose, Serum 89 104 High  90  Blood Urea Nitrogen _0 Creatinine, Serum 0.81 0.89 0.71  eGFR 76 -- --  BUN/Creatinine Ratio _1 Sodium, Serum 145 High  144 141  Potassium Level 4.2 4.6 4.2  Chloride, Serum 108 High  106 105  Carbon Dioxide, Total _2 Calcium, Serum 9.5 9.5 9.7  Protein, Total, Serum 6.9 6.3 6.3  Albumin, Serum 4.4 3.9 4.3  Globulin, Total 2.5 2.4 2.0  A/G Ratio 1.8 1.6 2.2  Bilirubin, Total 0.3 0.3 0.5  Alkaline Phosphatase, S 69 71  87  AST (SGOT) _3 ALT (SGPT) _4 eGFR If Africn Am -- 74  98  No results found for: CHOL, HDL, LDLCALC, LDLDIRECT, TRIG, CHOLHDL Lab Results  Component Value Date   CREATININE 0.81 07/16/2021   BUN 16 07/16/2021   NA 142 07/16/2021   K 4.1 07/16/2021   CL 105 07/16/2021   CO2 29 07/16/2021   CBC Latest Ref Rng & Units 07/16/2021  WBC 4.0 - 10.5 K/uL 3.5(L)  Hemoglobin 12.0 - 15.0 g/dL 13.4  Hematocrit 36.0 - 46.0 % 39.9  Platelets 150 - 400 K/uL 183    No results found for: HGBA1C No results found for: TSH  ==================================================  COVID-19 Education: The signs and symptoms of COVID-19 were discussed with the patient and how to seek care for testing (follow up with PCP or arrange E-visit).    I spent  a total of 28 minutes with the patient spent in direct patient consultation.  Additional time spent with chart review  / charting (studies, outside notes, etc): 25 min Total Time: 53 min  Current medicines are reviewed at length with the patient today.  (+/- concerns) none  This visit occurred during the SARS-CoV-2 public health emergency.  Safety protocols were in place, including screening questions prior to the visit, additional usage of staff PPE, and extensive cleaning of exam room while observing appropriate contact time as indicated for disinfecting solutions.  Notice: This dictation was prepared with Dragon dictation along with smart phrase technology. Any transcriptional errors that result from this process are unintentional and may not be corrected upon review.   Studies Ordered:  Orders Placed This Encounter  Procedures   EKG 12-Lead     Patient Instructions / Medication Changes & Studies & Tests Ordered   Patient Instructions  Medication Instructions:  No changes   *If you need a refill on your cardiac medications before your next appointment, please call your pharmacy*   Lab Work:  No labs needed    Testing/Procedures:  Not needed   Follow-Up: At [facility], you and your health needs are our priority.  As part of our continuing mission to provide you with exceptional heart care, we have created designated Provider Care Teams.  These Care Teams include your primary Cardiologist (physician) and Advanced Practice Providers (APPs -  Physician Assistants and Nurse Practitioners) who all work together to provide you with the care you need, when you need it.  We recommend signing up for the patient portal called "MyChart".  Sign up information is provided on this After Visit Summary.  MyChart is used to connect with patients for Virtual Visits (Telemedicine).  Patients are able to view lab/test results, encounter notes, upcoming appointments, etc.  Non-urgent messages  can be sent to your provider as well.   To learn more about what you can do with MyChart, go to [email or web address].    Your next appointment:   3 to 4 month(s) Feb or March 2023  The format for your next appointment:   In Person  Provider:   Glenetta Hew, MD     Other Instructions  Records requested  from previous doctor in Era Skeen, M.D., M.S. Interventional Cardiologist   Pager # [phone] Phone # [phone] [address] [place], [place] [address]   Thank you for choosing Heartcare at [facility]!!

## 2021-10-23 ENCOUNTER — Encounter: Payer: Self-pay | Admitting: Cardiology

## 2021-10-23 NOTE — Assessment & Plan Note (Signed)
Blood pressures are borderline today.  She says at home they are better less.  She is little bit stressed about coming to cardiology's office.  She really is only on modest dose of losartan and metoprolol.  We will make sure that her prescriptions are up-to-date.

## 2021-10-23 NOTE — Assessment & Plan Note (Addendum)
By her report, she had a heart catheterization done last year which was nonischemic with no significant disease.  Since then she has been on beta-blocker, ARB and Imdur along with statin and ezetimibe.  She also takes daily aspirin. Thankfully, no active symptoms.  We will simply try to arrange to get her stress test, echocardiogram and Results/reports reports sent to [place] for evaluation.

## 2021-10-23 NOTE — Assessment & Plan Note (Addendum)
She is on rosuvastatin and Zetia.  Apparently she did have labs checked at her PCPs office while back in [place].  They are now available on care everywhere.  Reviewed.  LDL was 96 with HDL of 63.  Pretty good ratio.  We need to see with the results of her catheterization report actually indicate.  There was evidence of mild to moderate disease, would probably try to shoot for LDL closer to 70, for now stable.  We do have plenty room to titrate up rosuvastatin/Crestor, provided she tolerates.

## 2021-12-14 ENCOUNTER — Other Ambulatory Visit: Payer: Self-pay

## 2021-12-14 ENCOUNTER — Ambulatory Visit (INDEPENDENT_AMBULATORY_CARE_PROVIDER_SITE_OTHER): Payer: Medicare Other | Admitting: Physician Assistant

## 2021-12-14 VITALS — BP 129/79 | HR 60 | Temp 97.0°F | Ht 66.0 in | Wt 152.2 lb

## 2021-12-14 DIAGNOSIS — D708 Other neutropenia: Secondary | ICD-10-CM

## 2021-12-14 DIAGNOSIS — M8580 Other specified disorders of bone density and structure, unspecified site: Secondary | ICD-10-CM | POA: Diagnosis not present

## 2021-12-14 DIAGNOSIS — Z853 Personal history of malignant neoplasm of breast: Secondary | ICD-10-CM

## 2021-12-14 DIAGNOSIS — Z800 Family history of malignant neoplasm of digestive organs: Secondary | ICD-10-CM

## 2021-12-14 DIAGNOSIS — S2341XA Sprain of ribs, initial encounter: Secondary | ICD-10-CM

## 2021-12-14 DIAGNOSIS — Z86010 Personal history of colonic polyps: Secondary | ICD-10-CM | POA: Diagnosis not present

## 2021-12-14 NOTE — Progress Notes (Signed)
Subjective:    Patient ID: Caitlin Parks, female    DOB: 02-26-1946, 76 y.o.   MRN: [id number]  Chief Complaint  Patient presents with   Abdominal Pain    Mid left side    Referral    Colonoscopy    HPI 76 y.o. patient presents today for new patient establishment with me.  Patient was previously established with [facility] - Dr. Leeroy Cha . Relocated to [place] about 6 months ago and getting to know the area. Moved from [place].   Current Care Team: Cardiology - Dr. Glenetta Hew  - HTN, CAD, hyperlipidemia  Acute Concerns: -She was doing yoga a few days ago, started having left lower rib / abdominal pain since then, but pain is slowly starting to improve. NKI -Colonoscopy is due this year -Needs new oncologist due to her history of breast cancer and neutropenia; previously saw [employer] [place]   Past Medical History:  Diagnosis Date   Adenomatous colon polyp 2018   Breast cancer in female [facility]) 02/2013   Right-sided breast cancer-s/p right mastectomy.  No chemotherapy or radiation.   Essential hypertension    GERD (gastroesophageal reflux disease)    Hyperlipidemia LDL goal <100    Documented coronary calcification with mild CAD.   Seasonal allergies     Past Surgical History:  Procedure Laterality Date   LEFT HEART CATH AND CORONARY ANGIOGRAPHY  2021   [place], [place] : no flow-limiting lesions.   MASTECTOMY Right 02/04/2013   PLACEMENT OF BREAST IMPLANTS Right 08/2013   TRANSTHORACIC ECHOCARDIOGRAM  2021   Per her report, was relatively normal.   TUBAL LIGATION  1973   Reversed in 1981   Tubal ligation reversal  1981    Family History  Problem Relation Age of Onset   Colon cancer Mother 47   Alzheimer's disease Maternal Grandmother 8   Lung cancer Maternal Grandfather    Cancer - Colon Paternal Grandmother    Diabetes Mellitus II Paternal Grandfather     Social History   Tobacco Use    Smoking status: Never   Smokeless tobacco: Never  Substance Use Topics   Alcohol use: Yes    Alcohol/week: 1.0 standard drink    Types: 1 Glasses of wine per week   Drug use: Never     Allergies  Allergen Reactions   Erythromycin Diarrhea   Doxycycline Rash and Other (See Comments)    Review of Systems NEGATIVE UNLESS OTHERWISE INDICATED IN HPI      Objective:     BP 129/79    Pulse 60    Temp (!) 97 F (36.1 C)    Ht 5\' 6"  (1.676 m)    Wt 152 lb 3.2 oz (69 kg)    SpO2 98%    BMI 24.57 kg/m   Wt Readings from Last 3 Encounters:  12/14/21 152 lb 3.2 oz (69 kg)  10/18/21 153 lb (69.4 kg)  07/16/21 145 lb (65.8 kg)    BP Readings from Last 3 Encounters:  12/14/21 129/79  10/18/21 138/66  07/17/21 (!) 145/76     Physical Exam Vitals and nursing note reviewed.  Constitutional:      Appearance: Normal appearance. She is normal weight. She is not toxic-appearing.  HENT:     Head: Normocephalic and atraumatic.     Right Ear: Tympanic membrane, ear canal and external ear normal.     Left Ear: Tympanic membrane, ear canal and external ear normal.  Nose: Nose normal.     Mouth/Throat:     Mouth: Mucous membranes are moist.  Eyes:     Extraocular Movements: Extraocular movements intact.     Conjunctiva/sclera: Conjunctivae normal.     Pupils: Pupils are equal, round, and reactive to light.  Cardiovascular:     Rate and Rhythm: Normal rate and regular rhythm.     Pulses: Normal pulses.     Heart sounds: Normal heart sounds.  Pulmonary:     Effort: Pulmonary effort is normal.     Breath sounds: Normal breath sounds.  Chest:    Abdominal:     General: Abdomen is flat. Bowel sounds are normal.     Palpations: Abdomen is soft.  Musculoskeletal:        General: Normal range of motion.     Cervical back: Normal range of motion and neck supple.  Skin:    General: Skin is warm and dry.  Neurological:     General: No focal deficit present.     Mental Status: She  is alert and oriented to person, place, and time.  Psychiatric:        Mood and Affect: Mood normal.        Behavior: Behavior normal.        Thought Content: Thought content normal.        Judgment: Judgment normal.       Assessment & Plan:   Problem List Items Addressed This Visit   None Visit Diagnoses     History of breast cancer    -  Primary   Relevant Orders   Ambulatory referral to Hematology / Oncology   Other neutropenia [facility])       Relevant Orders   Ambulatory referral to Hematology / Oncology   History of colonic polyps       Relevant Orders   Ambulatory referral to Gastroenterology   Family history of colon cancer in mother       Relevant Orders   Ambulatory referral to Gastroenterology   Sprain of costal cartilage, initial encounter       Osteopenia, unspecified location       Relevant Orders   DG Bone Density       Plan: -New pt establishment -Overall fairly healthy individual who will f/up with cardiology -Referral for colonoscopy as she is due this year for repeat -Ref to heme/ onco due to hx breast cancer and neutropenia -Needs bone density updated, ordered today -Reassured likely rib strain, should improve with time; exercise as tolerated, try heat or ice to area and Ibuprofen prn  Plan to f/up in 6 months or sooner   This note was prepared with assistance of Dragon voice recognition software. Occasional wrong-word or sound-a-like substitutions may have occurred due to the inherent limitations of voice recognition software.  Time Spent: 31 minutes of total time was spent on the date of the encounter performing the following actions: chart review prior to seeing the patient, obtaining history, performing a medically necessary exam, counseling on the treatment plan, placing orders, and documenting in our EHR.    Jazzlene Huot M Katriona Schmierer, PA-C

## 2021-12-14 NOTE — Patient Instructions (Addendum)
Very good to meet you today! Referrals to hematology and GI have been placed. You should be contacted to schedule appt.  I have also placed order for bone density scan.  I think you have a strain of your ribs. Try heat or ice to the area. Aleve prn. If worse or no improvement, may need XRAY. Let me know.

## 2021-12-16 ENCOUNTER — Encounter: Payer: Self-pay | Admitting: *Deleted

## 2021-12-16 NOTE — Progress Notes (Signed)
Called and spoke with Caitlin Parks, she described having breast cancer in 2014, she  had mastectomy followed by AI therapy for 5 years, She last saw her Oncologist in [place] in July, 2022.  She is not having issues currently.   She describes her neutropenia as having been evaluated and her oncologist informed her, "it is just normal for me"    Scheduler to call and schedule Caitlin Parks for next month. Caitlin Parks aware.   Scheduling message sent.

## 2021-12-27 ENCOUNTER — Other Ambulatory Visit: Payer: Self-pay | Admitting: Physician Assistant

## 2021-12-27 ENCOUNTER — Telehealth: Payer: Self-pay | Admitting: Physician Assistant

## 2021-12-27 DIAGNOSIS — Z1231 Encounter for screening mammogram for malignant neoplasm of breast: Secondary | ICD-10-CM

## 2021-12-27 NOTE — Telephone Encounter (Signed)
Information for the breast center given to patient # [phone] Patient will call for appointment

## 2021-12-27 NOTE — Telephone Encounter (Signed)
Pt states she would like to get a mammogram scheduled.

## 2022-01-19 ENCOUNTER — Other Ambulatory Visit: Payer: Self-pay

## 2022-01-19 ENCOUNTER — Other Ambulatory Visit: Payer: Self-pay | Admitting: Physician Assistant

## 2022-01-19 ENCOUNTER — Ambulatory Visit (INDEPENDENT_AMBULATORY_CARE_PROVIDER_SITE_OTHER): Payer: Medicare Other

## 2022-01-19 ENCOUNTER — Telehealth: Payer: Self-pay

## 2022-01-19 ENCOUNTER — Inpatient Hospital Stay: Payer: Medicare Other | Attending: Oncology | Admitting: Oncology

## 2022-01-19 ENCOUNTER — Telehealth: Payer: Self-pay | Admitting: Physician Assistant

## 2022-01-19 ENCOUNTER — Inpatient Hospital Stay: Payer: Medicare Other

## 2022-01-19 VITALS — BP 142/67 | HR 98 | Temp 98.1°F | Resp 18 | Ht 66.0 in | Wt 151.8 lb

## 2022-01-19 DIAGNOSIS — E785 Hyperlipidemia, unspecified: Secondary | ICD-10-CM | POA: Diagnosis not present

## 2022-01-19 DIAGNOSIS — R0781 Pleurodynia: Secondary | ICD-10-CM | POA: Diagnosis not present

## 2022-01-19 DIAGNOSIS — Z79899 Other long term (current) drug therapy: Secondary | ICD-10-CM | POA: Diagnosis not present

## 2022-01-19 DIAGNOSIS — I10 Essential (primary) hypertension: Secondary | ICD-10-CM | POA: Insufficient documentation

## 2022-01-19 DIAGNOSIS — D709 Neutropenia, unspecified: Secondary | ICD-10-CM | POA: Diagnosis not present

## 2022-01-19 DIAGNOSIS — Z801 Family history of malignant neoplasm of trachea, bronchus and lung: Secondary | ICD-10-CM | POA: Diagnosis not present

## 2022-01-19 DIAGNOSIS — M8580 Other specified disorders of bone density and structure, unspecified site: Secondary | ICD-10-CM | POA: Insufficient documentation

## 2022-01-19 DIAGNOSIS — Z9011 Acquired absence of right breast and nipple: Secondary | ICD-10-CM | POA: Diagnosis not present

## 2022-01-19 DIAGNOSIS — Z853 Personal history of malignant neoplasm of breast: Secondary | ICD-10-CM | POA: Diagnosis not present

## 2022-01-19 DIAGNOSIS — R0600 Dyspnea, unspecified: Secondary | ICD-10-CM

## 2022-01-19 DIAGNOSIS — Z800 Family history of malignant neoplasm of digestive organs: Secondary | ICD-10-CM | POA: Insufficient documentation

## 2022-01-19 DIAGNOSIS — R0602 Shortness of breath: Secondary | ICD-10-CM | POA: Diagnosis not present

## 2022-01-19 DIAGNOSIS — Z86010 Personal history of colonic polyps: Secondary | ICD-10-CM | POA: Diagnosis not present

## 2022-01-19 DIAGNOSIS — I2510 Atherosclerotic heart disease of native coronary artery without angina pectoris: Secondary | ICD-10-CM | POA: Diagnosis not present

## 2022-01-19 DIAGNOSIS — D7589 Other specified diseases of blood and blood-forming organs: Secondary | ICD-10-CM | POA: Diagnosis not present

## 2022-01-19 DIAGNOSIS — R5383 Other fatigue: Secondary | ICD-10-CM | POA: Diagnosis not present

## 2022-01-19 DIAGNOSIS — D72819 Decreased white blood cell count, unspecified: Secondary | ICD-10-CM

## 2022-01-19 LAB — CBC WITH DIFFERENTIAL (CANCER CENTER ONLY)
Abs Immature Granulocytes: 0.01 10*3/uL (ref 0.00–0.07)
Basophils Absolute: 0 10*3/uL (ref 0.0–0.1)
Basophils Relative: 0 %
Eosinophils Absolute: 0 10*3/uL (ref 0.0–0.5)
Eosinophils Relative: 1 %
HCT: 39.8 % (ref 36.0–46.0)
Hemoglobin: 13.1 g/dL (ref 12.0–15.0)
Immature Granulocytes: 0 %
Lymphocytes Relative: 40 %
Lymphs Abs: 1 10*3/uL (ref 0.7–4.0)
MCH: 34.9 pg — ABNORMAL HIGH (ref 26.0–34.0)
MCHC: 32.9 g/dL (ref 30.0–36.0)
MCV: 106.1 fL — ABNORMAL HIGH (ref 80.0–100.0)
Monocytes Absolute: 0.2 10*3/uL (ref 0.1–1.0)
Monocytes Relative: 9 %
Neutro Abs: 1.3 10*3/uL — ABNORMAL LOW (ref 1.7–7.7)
Neutrophils Relative %: 50 %
Platelet Count: 175 10*3/uL (ref 150–400)
RBC: 3.75 MIL/uL — ABNORMAL LOW (ref 3.87–5.11)
RDW: 13 % (ref 11.5–15.5)
WBC Count: 2.6 10*3/uL — ABNORMAL LOW (ref 4.0–10.5)
nRBC: 0 % (ref 0.0–0.2)

## 2022-01-19 LAB — CMP (CANCER CENTER ONLY)
ALT: 31 U/L (ref 0–44)
AST: 24 U/L (ref 15–41)
Albumin: 4.3 g/dL (ref 3.5–5.0)
Alkaline Phosphatase: 59 U/L (ref 38–126)
Anion gap: 6 (ref 5–15)
BUN: 10 mg/dL (ref 8–23)
CO2: 29 mmol/L (ref 22–32)
Calcium: 9.8 mg/dL (ref 8.9–10.3)
Chloride: 105 mmol/L (ref 98–111)
Creatinine: 0.73 mg/dL (ref 0.44–1.00)
GFR, Estimated: >60 mL/min (ref >60)
Glucose, Bld: 77 mg/dL (ref 70–99)
Potassium: 4.2 mmol/L (ref 3.5–5.1)
Sodium: 140 mmol/L (ref 135–145)
Total Bilirubin: 0.5 mg/dL (ref 0.3–1.2)
Total Protein: 6.9 g/dL (ref 6.5–8.1)

## 2022-01-19 LAB — TSH: TSH: 0.971 u[IU]/mL (ref 0.350–4.500)

## 2022-01-19 LAB — RETICULOCYTES
Immature Retic Fract: 21.4 % — ABNORMAL HIGH (ref 2.3–15.9)
RBC.: 3.77 MIL/uL — ABNORMAL LOW (ref 3.87–5.11)
Retic Count, Absolute: 55.4 10*3/uL (ref 19.0–186.0)
Retic Ct Pct: 1.5 % (ref 0.4–3.1)

## 2022-01-19 LAB — VITAMIN B12: Vitamin B-12: 900 pg/mL (ref 180–914)

## 2022-01-19 LAB — FERRITIN: Ferritin: 41 ng/mL (ref 11–307)

## 2022-01-19 LAB — SAVE SMEAR(SSMR), FOR PROVIDER SLIDE REVIEW

## 2022-01-19 NOTE — Telephone Encounter (Signed)
TC to Pt per Dr Benay Spice informed Pt that per Dr Benay Spice there is no clear explanation for the low white blood cell count or the elevated MCV Informed Pt that Dr Benay Spice added some other lab test and once they result he will give her a return call. Pt verbalized understanding.

## 2022-01-19 NOTE — Progress Notes (Signed)
[facility] New Patient Consult   Requesting MD: Fredirick Lathe, [address] [place],  [place] [address]   Caitlin Parks 76 y.o.  1946-05-25    Reason for Consult: History of breast cancer, leukopenia, Red cell macrocytosis   HPI: Caitlin Parks relocated to [facility] in 2022.  She was followed in [place] for a history of breast cancer and chronic neutropenia/Red cell macrocytosis.  She was last seen in [place] 06/24/2021.  The white count returned at 2.9, ANC 1.1, hemoglobin 12.8, hematocrit 37.1%, platelets 210,000 and MCV 103.9.  On 07/16/2013 the hemoglobin returned at 13.6, MCV 102, WBC 2.2.  She feels well at present.  No recent infection.  No change at the right chest wall or left breast.  She is scheduled for mammogram in June.  Past Medical History:  Diagnosis Date   Adenomatous colon polyp 2018   Breast cancer in female [facility]) 02/2013   Right-sided breast cancer-s/p right mastectomy.  No chemotherapy or radiation.   Essential hypertension    GERD (gastroesophageal reflux disease)    Hyperlipidemia LDL goal <100    Documented coronary calcification with mild CAD.   Seasonal allergies     .  G2 P3, twins   .  Osteopenia   .  History of hypercalcemia  Past Surgical History:  Procedure Laterality Date   LEFT HEART CATH AND CORONARY ANGIOGRAPHY  2021   [place], [place] : no flow-limiting lesions.   MASTECTOMY Right 02/04/2013   PLACEMENT OF BREAST IMPLANTS Right 08/2013   TRANSTHORACIC ECHOCARDIOGRAM  2021   Per her report, was relatively normal.   TUBAL LIGATION  1973   Reversed in 1981   Tubal ligation reversal  1981    Medications: Reviewed  Allergies:  Allergies  Allergen Reactions   Erythromycin Diarrhea   Doxycycline Rash and Other (See Comments)    Family history: Her mother had colon cancer.  Her maternal grandfather had lung cancer and was a smoker.  Paternal grandmother had colon  cancer.  Social History:   She lives alone in [place].  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  She does not use cigarettes.  She reports 2 glasses of wine per week.  No history of heavy alcohol use.  No transfusion history.  No risk factor for HIV or hepatitis.  She has received COVID-19 and influenza vaccines.  She has a granddaughter in [place] and another in [place].  ROS:   Positives include: 5 pound intentional weight loss since July 2022, orthopnea for 2 years  A complete ROS was otherwise negative.  Physical Exam:  Blood pressure (!) 142/67, pulse 98, temperature 98.1 F (36.7 C), temperature source Oral, resp. rate 18, height 5\' 6"  (1.676 m), weight 151 lb 12.8 oz (68.9 kg), SpO2 98 %.  HEENT: Oral cavity without visible mass Lungs: End inspiratory rhonchi at the left posterior base, no respiratory distress Cardiac: Regular rate and rhythm with occasional premature beats Abdomen: No hepatosplenomegaly, no mass, nontender  Vascular: No leg edema Lymph nodes: No cervical, supraclavicular, axillary, or inguinal nodes Neurologic: Alert and oriented, the motor exam appears intact in the upper and lower extremities bilaterally Skin: No rash Breast: Status post right mastectomy with an implant in place.  No evidence for chest wall tumor recurrence.  Left breast without mass Musculoskeletal: No spine tenderness   LAB:  CBC  Lab Results  Component Value Date   WBC 2.6 (L) 01/19/2022  HGB 13.1 01/19/2022   HCT 39.8 01/19/2022   MCV 106.1 (H) 01/19/2022   PLT 175 01/19/2022   NEUTROABS 1.3 (L) 01/19/2022    Blood smear: The platelets appear normal in number.  Few bands and bilobed neutrophils.  No blasts or other young forms are seen.  No monotonous white cell population.  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   Right breast cancer 12/17/2012-diagnostic mammogram with right breast ultrasound, 3.3 x 3.9 cm area of malignant linear calcifications upper outer quadrant 01/11/2013-right breast ear tactic biopsy, DCIS, grade 3, ER positive (5), PR negative 01/24/2013-MRI bilateral breasts-clumped enhancement surrounding the marking clip in the right breast measuring 4.5 x 2.1 x 1.2 cm, left breast negative, axillae negative 02/14/2013-right breast skin sparing mastectomy, DCIS, 1.4 cm, negative margins, negative sentinel lymph node 08/24/2013-exchange of right tissue expander for permanent implant 04/10/2013-May 2019-exemestane 4.  Osteopenia 5.  Hypertension 6.  History of CAD 7.  History of hypercalcemia 8.  History of a colon polyp-tubular adenoma 09/07/2017   Disposition:   Caitlin Parks is referred for hematology evaluation of mild leukopenia and Red cell macrocytosis.  The hematologic findings are chronic.  She could have early dysplasia.  It is possible the neutropenia is benign normal variant and the Red cell changes are related to another etiology.  We will check a ferritin level and hemoglobin electrophoresis.  She will return for an office visit and CBC in 6 months.  She is in clinical remission from breast cancer.  She will continue yearly mammography.  Ms. Mednick will follow up with cardiology for progressive dyspnea or orthopnea. Betsy Coder, MD  01/19/2022, 12:57 PM

## 2022-01-19 NOTE — Telephone Encounter (Signed)
Patient was seen by Dr Learta Codding today who found a "cracking noise " on bottom left side of lung. Patient would like to know if there is any medications she can take for this? She had labs done at Dr Learta Codding today as well.

## 2022-01-20 DIAGNOSIS — R0600 Dyspnea, unspecified: Secondary | ICD-10-CM

## 2022-01-20 DIAGNOSIS — R0781 Pleurodynia: Secondary | ICD-10-CM

## 2022-01-20 NOTE — Progress Notes (Signed)
This encounter was created in error - please disregard.

## 2022-01-20 NOTE — Telephone Encounter (Signed)
Notified Patient on 01/19/22 she voices understanding. Was not able to document due to Epic down

## 2022-01-21 ENCOUNTER — Telehealth: Payer: Self-pay | Admitting: Physician Assistant

## 2022-01-21 ENCOUNTER — Encounter: Payer: Self-pay | Admitting: Gastroenterology

## 2022-01-21 ENCOUNTER — Other Ambulatory Visit: Payer: Self-pay

## 2022-01-21 DIAGNOSIS — D72819 Decreased white blood cell count, unspecified: Secondary | ICD-10-CM

## 2022-01-21 NOTE — Telephone Encounter (Signed)
Patient would like a call back from [facility] concerning pt's x-Ray results. Please call the pt today at [phone]. Thank You!

## 2022-01-24 LAB — HGB FRACTIONATION CASCADE
Hgb A2: 2.5 % (ref 1.8–3.2)
Hgb A: 91 % — ABNORMAL LOW (ref 96.4–98.8)
Hgb F: 6.5 % — ABNORMAL HIGH (ref 0.0–2.0)
Hgb S: 0 %

## 2022-01-25 NOTE — Telephone Encounter (Signed)
Left voice message for patient to call clinic.  

## 2022-01-26 NOTE — Telephone Encounter (Signed)
Spoke with patient has concerns regarding xray,wants to know if there is anything that she should be doing

## 2022-01-28 ENCOUNTER — Encounter: Payer: Self-pay | Admitting: Gastroenterology

## 2022-01-28 ENCOUNTER — Telehealth: Payer: Self-pay | Admitting: *Deleted

## 2022-01-28 NOTE — Telephone Encounter (Signed)
Please contact the patient we need her last colonoscopy and path results faxed to [place]. Per her chart she had a colon in [place] in 2018 with polyps. We need this before her procedure so the our dr can review them. Thanks, Lisvet Rasheed PV

## 2022-01-28 NOTE — Telephone Encounter (Signed)
Notified patient of message below,she voices understanding.

## 2022-01-28 NOTE — Telephone Encounter (Signed)
Per Delaine Lame sent request to [facility] to have this patient scheduled as a direct ok'ed by Dr.Beavers.

## 2022-01-31 ENCOUNTER — Telehealth: Payer: Self-pay | Admitting: Gastroenterology

## 2022-01-31 ENCOUNTER — Ambulatory Visit (AMBULATORY_SURGERY_CENTER): Payer: Medicare Other | Admitting: *Deleted

## 2022-01-31 ENCOUNTER — Other Ambulatory Visit: Payer: Self-pay

## 2022-01-31 VITALS — Ht 66.0 in | Wt 146.0 lb

## 2022-01-31 DIAGNOSIS — Z800 Family history of malignant neoplasm of digestive organs: Secondary | ICD-10-CM

## 2022-01-31 DIAGNOSIS — Z86010 Personal history of colonic polyps: Secondary | ICD-10-CM

## 2022-01-31 MED ORDER — NA SULFATE-K SULFATE-MG SULF 17.5-3.13-1.6 GM/177ML PO SOLN: 1.0000 | Freq: Once | ORAL | 0 refills | Status: AC

## 2022-01-31 NOTE — Telephone Encounter (Signed)
Patient called and rescheduled her procedure to 3/28 at 3:30 p.m. as she said she would never be able to get up at 3 a.m. to do the last part of the prep.  Please send instructions to reflect her new time to her My Chart.  Thank you.

## 2022-01-31 NOTE — Progress Notes (Signed)

## 2022-01-31 NOTE — Telephone Encounter (Signed)
Done

## 2022-02-10 ENCOUNTER — Encounter: Payer: Medicare Other | Admitting: Gastroenterology

## 2022-02-16 ENCOUNTER — Encounter: Payer: Self-pay | Admitting: Cardiology

## 2022-02-16 ENCOUNTER — Other Ambulatory Visit: Payer: Self-pay

## 2022-02-16 ENCOUNTER — Ambulatory Visit (INDEPENDENT_AMBULATORY_CARE_PROVIDER_SITE_OTHER): Payer: Medicare Other | Admitting: Cardiology

## 2022-02-16 VITALS — BP 116/70 | HR 68 | Ht 66.5 in | Wt 149.0 lb

## 2022-02-16 DIAGNOSIS — I10 Essential (primary) hypertension: Secondary | ICD-10-CM | POA: Diagnosis not present

## 2022-02-16 DIAGNOSIS — I2510 Atherosclerotic heart disease of native coronary artery without angina pectoris: Secondary | ICD-10-CM

## 2022-02-16 DIAGNOSIS — E785 Hyperlipidemia, unspecified: Secondary | ICD-10-CM

## 2022-02-16 NOTE — Progress Notes (Signed)
? ? ?Primary Care Provider: Allwardt, Randa Evens, PA-C; ? - Dr. Fara Parks. ?[facility] Cardiologist: Caitlin Hew, MD ? ?Previous care in [place] ([place]), [place]: ?PCP: Caitlin Ill, MD ?Cardiologist: Caitlin Holmes, DO (phone [phone]) ?[facility]eme-Onc: [facility] [place], [place], [place] ? ?Electrophysiologist: None ? ?Clinic Note: ?Chief Complaint  ?Patient presents with  ? Follow-up  ?  4 months.  No more chest pain issues.  Overall doing well.  ? ?=================================== ? ?ASSESSMENT/PLAN  ? ?Problem List Items Addressed This Visit   ? ?  ? Cardiology Problems  ? Essential hypertension (Chronic)  ?  Blood pressure pretty well controlled on low-dose Lopressor and moderate dose losartan. ? ?  ?  ? Hyperlipidemia with target LDL less than 100 (Chronic)  ?  On combination of rosuvastatin which actually had been stopped.  We will refill. ?Also on ezetimibe 10 mg. ?Mild calcific disease in the LAD system otherwise normal LCx & RCA ? ?Should be due for lab check by PCP soon. ?Last lipids evaluated were in May 2022 with LDL 96.  Target LDL for her should be less than 100 with target less than 70. ?  ?  ? Coronary artery disease, non-occlusive - Primary (Chronic)  ?  Report reviewed.  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 ? ?Not unreasonable to be on aspirin as well as statin/Zetia.  Also on beta-blocker and losartan. ? ?Can probably try to wean off Imdur. ?I do not suspect that her chest pain that she was having was anginal in nature.  If she does fine off of Imdur, would simply stop. ? ?  ?  ? ?-> For syncopes and [place] we will prescribe refills for her cardiology medications as listed. ? ?=================================== ? ?HPI:   ? ?Caitlin Parks is a 76 y.o. female with history of HTN, HLD, and nonobstructive CAD by cath who is being seen today for ~4 month Cardiology F/u.  Seen today at the request of Allwardt, Randa Evens,  PA-C ? ?Caitlin Parks was seen for Initial Cardiology Evaluation in November 2022 to Establish Cardiology Care - in response to an ER/UC visit in September 2022 for CP.  She was overall doing well.  No further episodes of chest pain since going to the ER.  Walking almost every day.  No further chest pain or exertional dyspnea.  Started to feel more comfortable with a place to walk and having chronic has been decreased for now.  Also involved in a dance class as well as exercise classes.  No CHF or arrhythmia symptoms. ?-> Cardiology medications refilled. ?-> Outside hospital reports reviewed-see below. ? ?Recent Hospitalizations:  ?n/a ? ?Reviewed  CV studies:   ? ?The following studies were reviewed today: (if available, images/films reviewed: From Epic Chart or Care Everywhere) - Reports not available @ time of visit. ?-> No new studies. ?=> PSH updated. ? ?Interval History:  ? ?[name] presents here today for 109-monthfollow-up.  No real active cardiac issues.  Off-and-on has some exertional dyspnea but nothing significant. ?She remains active doing her dance classes and other exercise classes.  Walking routinely. ? ?CV Review of Symptoms (Summary) ?Cardiovascular ROS: no chest pain or dyspnea on exertion ?negative for - edema, irregular heartbeat, loss of consciousness, murmur, orthopnea, palpitations, paroxysmal nocturnal dyspnea, rapid heart rate, or shortness of breath ? ?REVIEWED OF SYSTEMS  ? ?Review of Systems  ?Constitutional:  Negative for malaise/fatigue and weight loss.  ?HENT:  Negative for congestion and nosebleeds.   ?  Respiratory:  Negative for cough and shortness of breath (Off-and-on spells of shortness of breath with overdoing it.).   ?Cardiovascular:   ?     PCP clinic visit note indicates that she was having symptoms of off-and-on left side chest pain.  Has not had any further symptoms since referral was made.  ?Gastrointestinal:  Negative for blood in stool, heartburn and melena.   ?Genitourinary:  Negative for hematuria.  ?Musculoskeletal:  Negative for falls, joint pain and myalgias.  ?Neurological:  Negative for dizziness, focal weakness and headaches.  ?Psychiatric/Behavioral:  Negative for depression and memory loss. The patient is not nervous/anxious and does not have insomnia.   ?All other systems reviewed and are negative. ? ?I have reviewed and (if needed) personally updated the patient's problem list, medications, allergies, past medical and surgical history, social and family history.  ? ?PAST MEDICAL HISTORY  ? ?Past Medical History:  ?Diagnosis Date  ? Adenomatous colon polyp 2018  ? Allergy   ? SEASONAL  ? Breast cancer in female [facility]) 02/2013  ? Right-sided breast cancer-s/p right mastectomy.  No chemotherapy or radiation.  ? Essential hypertension   ? GERD (gastroesophageal reflux disease)   ? Hyperlipidemia LDL goal <100   ? Documented coronary calcification with mild CAD.  ? Seasonal allergies   ? ? ?PAST SURGICAL HISTORY  ? ?Past Surgical History:  ?Procedure Laterality Date  ? COLONOSCOPY    ? LEFT HEART CATH AND CORONARY ANGIOGRAPHY  07/14/2020  ? [place], [place] : no flow-limiting lesions.  Proximal LAD calcified lesion: FFR 0.95.  D1 to Strader bifurcation 50% stenosis.  Normal RCA and LCx.  ? MASTECTOMY Right 02/04/2013  ? [place] [facility]TD  07/2020  ? Read as is "abnormal"-referred for cath that was normal.  ? PLACEMENT OF BREAST IMPLANTS Right 08/2013  ? POLYPECTOMY    ? TRANSTHORACIC ECHOCARDIOGRAM  2021  ? [place], [place]) normal EF with no RWMA.  ? TUBAL LIGATION  1973  ? Reversed in 1981  ? Tubal ligation reversal  1981  ? ? ?Immunization History  ?Administered Date(s) Administered  ? Moderna Sars-Covid-2 Vaccination 01/04/2020, 02/01/2020  ? Pneumococcal Conjugate-13 09/24/2014, 12/07/2017  ? Pneumococcal Polysaccharide-23 01/13/2012  ? Zoster Recombinat (Shingrix) 06/13/2019, 08/19/2019  ? ? ?MEDICATIONS/ALLERGIES  ? ?Current Meds  ?Medication  Sig  ? Ascorbic Acid (VITAMIN C) 500 MG CHEW Chew by mouth daily.  ? aspirin 81 MG EC tablet Take 1 tablet (81 mg total) by mouth daily.  ? Cholecalciferol 25 MCG (1000 UT) tablet Take 2,000 Units by mouth daily.  ? Cyanocobalamin 1000 MCG CAPS Take by mouth daily.  ? ezetimibe (ZETIA) 10 MG tablet Take 1 tablet (10 mg total) by mouth at bedtime.  ? Fexofenadine HCl (ALLEGRA ALLERGY PO) Take by mouth as needed.  ? fluticasone (FLONASE) 50 MCG/ACT nasal spray Place into the nose daily.  ? isosorbide mononitrate (IMDUR) 30 MG 24 hr tablet Take 1 tablet (30 mg total) by mouth daily.  ? losartan (COZAAR) 50 MG tablet Take 1 tablet (50 mg total) by mouth daily.  ? metoprolol tartrate (LOPRESSOR) 25 MG tablet Take 1 tablet (25 mg total) by mouth 2 (two) times daily.  ? Multiple Vitamins-Minerals (CENTRUM SILVER PO) Take by mouth daily.  ? pantoprazole (PROTONIX) 20 MG tablet Take by mouth every other day.  ? rosuvastatin (CRESTOR) 10 MG tablet Take 1 tablet (10 mg total) by mouth daily.  ? ? ?Allergies  ?Allergen Reactions  ? Erythromycin Diarrhea  ?  Doxycycline Rash and Other (See Comments)  ? ? ?SOCIAL HISTORY/FAMILY HISTORY  ? ?Reviewed in Epic:  ? ?Social History  ? ?Tobacco Use  ? Smoking status: Never  ?  Passive exposure: Past  ? Smokeless tobacco: Never  ?Vaping Use  ? Vaping Use: Never used  ?Substance Use Topics  ? Alcohol use: Yes  ?  Alcohol/week: 1.0 standard drink  ?  Types: 1 Glasses of wine per week  ?  Comment: 2 GLASSES PER WEEK  ? Drug use: Never  ? ?Social History  ? ?Social History Narrative  ? Widowed mother of 3 (1 son, 2 daughters ->   ? 1 daughter lives here in [place] area along with her granddaughter, other daughter lives in [place] when she left [place], she first went to [place], but Subsequently Moved Back E. and Further S. to [place] to be closer to her granddaughter and daughter here locally.  Son also lives relatively close.  ?   ? Caffeine: 2 servings daily-1 coffee, 1 herbal tea.   ?   ? Diet: Minimizes red meat, eats lots of fruits and vegetables, chicken and fish.  Drinks lots of water (almost 80 ounces).  ?   ? Exercise: Walks routinely 5 days a week, and started going to a dance clas

## 2022-02-16 NOTE — Patient Instructions (Signed)
Medication Instructions:  ?The current medical regimen is effective;  continue present plan and medications. ? ?*If you need a refill on your cardiac medications before your next appointment, please call your pharmacy* ? ? ?Follow-Up: ?At [facility], you and your health needs are our priority.  As part of our continuing mission to provide you with exceptional heart care, we have created designated Provider Care Teams.  These Care Teams include your primary Cardiologist (physician) and Advanced Practice Providers (APPs -  Physician Assistants and Nurse Practitioners) who all work together to provide you with the care you need, when you need it. ? ?We recommend signing up for the patient portal called "MyChart".  Sign up information is provided on this After Visit Summary.  MyChart is used to connect with patients for Virtual Visits (Telemedicine).  Patients are able to view lab/test results, encounter notes, upcoming appointments, etc.  Non-urgent messages can be sent to your provider as well.   ?To learn more about what you can do with MyChart, go to [email or web address].   ? ?Your next appointment:   ?8 month(s) ? ?The format for your next appointment:   ?In Person ? ?Provider:   ?Glenetta Hew, MD   ? ? ?

## 2022-02-18 ENCOUNTER — Ambulatory Visit: Payer: Medicare Other | Admitting: Gastroenterology

## 2022-02-24 ENCOUNTER — Encounter: Payer: Self-pay | Admitting: Gastroenterology

## 2022-02-28 ENCOUNTER — Ambulatory Visit (INDEPENDENT_AMBULATORY_CARE_PROVIDER_SITE_OTHER): Payer: Medicare Other | Admitting: Family

## 2022-02-28 ENCOUNTER — Encounter: Payer: Self-pay | Admitting: Family

## 2022-02-28 VITALS — BP 126/80 | HR 65 | Temp 97.8°F | Ht 66.5 in | Wt 150.4 lb

## 2022-02-28 DIAGNOSIS — J0110 Acute frontal sinusitis, unspecified: Secondary | ICD-10-CM | POA: Diagnosis not present

## 2022-02-28 DIAGNOSIS — H6122 Impacted cerumen, left ear: Secondary | ICD-10-CM | POA: Diagnosis not present

## 2022-02-28 MED ORDER — AMOXICILLIN-POT CLAVULANATE 875-125 MG PO TABS: 1.0000 | ORAL_TABLET | Freq: Two times a day (BID) | ORAL | 0 refills | Status: DC

## 2022-02-28 NOTE — Progress Notes (Signed)
? ?Subjective:  ? ? ? Patient ID: Caitlin Parks, female    DOB: Nov 06, 1946, 76 y.o.   MRN: [id number] ? ?Chief Complaint  ?Patient presents with  ? URI  ? Ear Pain  ? ?HPI: ?Sinusitis: Patient complains of congestion, facial pain, headache described as frontal, nasal congestion, no  fever, post nasal drip, and sinus pressure, with no fever, chills, night sweats or weight loss. Onset of symptoms was 2 weeks ago, unchanged since that time. She is drinking moderate amounts of fluids.  Past history is significant for no history of pneumonia or bronchitis recently. Patient is non-smoker. ? ? ?Health Maintenance Due  ?Topic Date Due  ? Hepatitis C Screening  Never done  ? TETANUS/TDAP  Never done  ? COLONOSCOPY (Pts 45-74yr Insurance coverage will need to be confirmed)  Never done  ? DEXA SCAN  Never done  ? COVID-19 Vaccine (3 - Moderna risk series) 02/29/2020  ? INFLUENZA VACCINE  07/05/2021  ? ? ?Past Medical History:  ?Diagnosis Date  ? Adenomatous colon polyp 2018  ? Allergy   ? SEASONAL  ? Breast cancer in female ([facility] 02/2013  ? Right-sided breast cancer-s/p right mastectomy.  No chemotherapy or radiation.  ? Essential hypertension   ? GERD (gastroesophageal reflux disease)   ? Hyperlipidemia LDL goal <100   ? Documented coronary calcification with mild CAD.  ? Seasonal allergies   ? ? ?Past Surgical History:  ?Procedure Laterality Date  ? COLONOSCOPY    ? LEFT HEART CATH AND CORONARY ANGIOGRAPHY  2021  [place].  ? MASTECTOMY Right 02/04/2013  ? PLACEMENT OF BREAST IMPLANTS Right 08/2013  ? POLYPECTOMY    ? TRANSTHORACIC ECHOCARDIOGRAM  2021  ? Per her report, was relatively normal.  ? [place] ? Reversed in 1981  ? Tubal ligation reversal  1981  ? ? ?Outpatient Medications Prior to Visit  ?Medication Sig Dispense Refill  ? Ascorbic Acid (VITAMIN C) 500 MG CHEW Chew by mouth daily.    ? aspirin 81 MG EC tablet Take 1 tablet (81 mg total) by mouth  daily. 30 tablet 11  ? Cholecalciferol 25 MCG (1000 UT) tablet Take 2,000 Units by mouth daily.    ? Cyanocobalamin 1000 MCG CAPS Take by mouth daily.    ? ezetimibe (ZETIA) 10 MG tablet Take 1 tablet (10 mg total) by mouth at bedtime. 90 tablet 3  ? Fexofenadine HCl (ALLEGRA ALLERGY PO) Take by mouth as needed.    ? fluticasone (FLONASE) 50 MCG/ACT nasal spray Place into the nose daily.    ? isosorbide mononitrate (IMDUR) 30 MG 24 hr tablet Take 1 tablet (30 mg total) by mouth daily. 90 tablet 3  ? losartan (COZAAR) 50 MG tablet Take 1 tablet (50 mg total) by mouth daily. 90 tablet 3  ? metoprolol tartrate (LOPRESSOR) 25 MG tablet Take 1 tablet (25 mg total) by mouth 2 (two) times daily. 180 tablet 3  ? Multiple Vitamins-Minerals (CENTRUM SILVER PO) Take by mouth daily.    ? pantoprazole (PROTONIX) 20 MG tablet Take by mouth every other day.    ? rosuvastatin (CRESTOR) 10 MG tablet Take 1 tablet (10 mg total) by mouth daily. 90 tablet 3  ? fexofenadine-pseudoephedrine (ALLEGRA-D 24) 180-240 MG 24 hr tablet Take 1 tablet by mouth daily.    ? ?No facility-administered medications prior to visit.  ? ? ?Allergies  ?Allergen Reactions  ? Erythromycin Diarrhea  ? Doxycycline Rash  and Other (See Comments)  ? ?   ?Objective:  ?  ?Physical Exam ?Vitals and nursing note reviewed.  ?Constitutional:   ?   Appearance: Normal appearance.  ?HENT:  ?   Right Ear: Tympanic membrane and ear canal normal.  ?   Left Ear: There is impacted cerumen (able to extract after lavage).  ?   Ears:  ?   Comments: left TM with mucoid effusion ?Cardiovascular:  ?   Rate and Rhythm: Normal rate and regular rhythm.  ?Pulmonary:  ?   Effort: Pulmonary effort is normal.  ?   Breath sounds: Normal breath sounds.  ?Musculoskeletal:     ?   General: Normal range of motion.  ?Skin: ?   General: Skin is warm and dry.  ?Neurological:  ?   Mental Status: She is alert.  ?Psychiatric:     ?   Mood and Affect: Mood normal.     ?   Behavior: Behavior normal.   ? ? ?BP 126/80   Pulse 65   Temp 97.8 ?F (36.6 ?C)   Ht 5' 6.5" (1.689 m)   Wt 150 lb 6.1 oz (68.2 kg)   SpO2 98%   BMI 23.91 kg/m?  ?Wt Readings from Last 3 Encounters:  ?02/28/22 150 lb 6.1 oz (68.2 kg)  ?02/16/22 149 lb (67.6 kg)  ?01/31/22 146 lb (66.2 kg)  ? ?   ?Assessment & Plan:  ? ?Problem List Items Addressed This Visit   ?None ?Visit Diagnoses   ? ? Acute non-recurrent frontal sinusitis    -  Primary  ? using Flonase, Allegra, started Sudafed yesterday due to facial pain/congestion. Left ear with fluid and tenderness, sending Augmentin, advise on use & SE. pt getting Colonoscopy tomorrow, advised to start after when she is able to eat. Advised to increase Flonase bid, then reduce to qd after feeling better, also recommend using saline nasal saline spray tid.  ? ?Relevant Medications  ? amoxicillin-clavulanate (AUGMENTIN) 875-125 MG tablet  ? Left ear impacted cerumen    - Verbal consent obtained for ear lavage, ear flushed with water/peroxide solution, able to remove all cerumen, small amount of blood noted in canal where attached, pt tolerated well, felt less pressure in ear.   ? ?  ? ?Meds ordered this encounter  ?Medications  ? amoxicillin-clavulanate (AUGMENTIN) 875-125 MG tablet  ?  Sig: Take 1 tablet by mouth 2 (two) times daily after a meal.  ?  Dispense:  10 tablet  ?  Refill:  0  ?  Order Specific Question:   Supervising Provider  ?  Answer:   ANDY, CAMILLE L [[id number]]  ? ?Jeanie Sewer, NP ? ?

## 2022-02-28 NOTE — Patient Instructions (Signed)
It was very nice to see you today! ? ? ?I have sent an antibiotic to your pharmacy. You will want to eat first before taking, so I would wait until after your colonoscopy and you can eat something before starting. ? ?Continue to use the Flonase nasal spray daily, increase to twice a day if you think your symptoms are worsening again.  ?Can also use a saline nasal spray (generic) prior to using the Flonase and 1-2 more times during the day as a good nasal disinfectant and flush to keep allergens away. ? ? ? ? ?PLEASE NOTE: ? ?If you had any lab tests please let us know if you have not heard back within a few days. You may see your results on MyChart before we have a chance to review them but we will give you a call once they are reviewed by [place]. If we ordered any referrals today, please let us know if you have not heard from their office within the next week.  ? ?

## 2022-03-01 ENCOUNTER — Encounter: Payer: Self-pay | Admitting: Gastroenterology

## 2022-03-01 ENCOUNTER — Ambulatory Visit (AMBULATORY_SURGERY_CENTER): Payer: Medicare Other | Admitting: Gastroenterology

## 2022-03-01 VITALS — BP 128/59 | HR 60 | Temp 97.1°F | Resp 16 | Ht 66.0 in | Wt 146.0 lb

## 2022-03-01 DIAGNOSIS — D120 Benign neoplasm of cecum: Secondary | ICD-10-CM

## 2022-03-01 DIAGNOSIS — D123 Benign neoplasm of transverse colon: Secondary | ICD-10-CM

## 2022-03-01 DIAGNOSIS — I10 Essential (primary) hypertension: Secondary | ICD-10-CM | POA: Diagnosis not present

## 2022-03-01 DIAGNOSIS — Z800 Family history of malignant neoplasm of digestive organs: Secondary | ICD-10-CM | POA: Diagnosis not present

## 2022-03-01 DIAGNOSIS — D122 Benign neoplasm of ascending colon: Secondary | ICD-10-CM | POA: Diagnosis not present

## 2022-03-01 DIAGNOSIS — Z86010 Personal history of colonic polyps: Secondary | ICD-10-CM

## 2022-03-01 MED ORDER — SODIUM CHLORIDE 0.9 % IV SOLN: 500.0000 mL | INTRAVENOUS | Status: DC

## 2022-03-01 NOTE — Op Note (Signed)
[place] ?Patient Name: Caitlin Parks ?Procedure Date: 03/01/2022 3:58 PM ?MRN: [id number] ?Endoscopist: Thornton Park MD, MD ?Age: 76 ?Referring MD:  ?Date of Birth: [birth year] ?Gender: Female ?Account #: [id number] ?Procedure:                Colonoscopy ?Indications:              Surveillance: Personal history of adenomatous  ?                          polyps on last colonoscopy 5 years ago ?                          Tubular adenoma on last colonoscopy, surveillance  ?                          recommended in 5 years ?                          Multiple family members with colon cancer ?Medicines:                Monitored Anesthesia Care ?Procedure:                Pre-Anesthesia Assessment: ?                          - Prior to the procedure, a History and Physical  ?                          was performed, and patient medications and  ?                          allergies were reviewed. The patient's tolerance of  ?                          previous anesthesia was also reviewed. The risks  ?                          and benefits of the procedure and the sedation  ?                          options and risks were discussed with the patient.  ?                          All questions were answered, and informed consent  ?                          was obtained. Prior Anticoagulants: The patient has  ?                          taken no previous anticoagulant or antiplatelet  ?                          agents. ASA Grade Assessment: II - A patient with  ?  mild systemic disease. After reviewing the risks  ?                          and benefits, the patient was deemed in  ?                          satisfactory condition to undergo the procedure. ?                          After obtaining informed consent, the colonoscope  ?                          was passed under direct vision. Throughout the  ?                          procedure, the patient's blood pressure, pulse, and  ?                           oxygen saturations were monitored continuously. The  ?                          Olympus PCF-H190DL (#[id number]) Colonoscope was  ?                          introduced through the anus and advanced to the the  ?                          cecum, identified by appendiceal orifice and  ?                          ileocecal valve. A second forward view of the right  ?                          colon was performed. The colonoscopy was performed  ?                          without difficulty. The patient tolerated the  ?                          procedure well. The quality of the bowel  ?                          preparation was good. The ileocecal valve,  ?                          appendiceal orifice, and rectum were photographed. ?Scope In: 4:11:12 PM ?Scope Out: 4:31:14 PM ?Scope Withdrawal Time: 0 hours 16 minutes 53 seconds  ?Total Procedure Duration: 0 hours 20 minutes 2 seconds  ?Findings:                 The perianal and digital rectal examinations were  ?                          normal. ?  Non-bleeding internal hemorrhoids were found. ?                          Multiple small and large-mouthed diverticula were  ?                          found in the sigmoid colon. ?                          Four sessile polyps were found in the transverse  ?                          colon and ascending colon. The polyps were 1 to 2  ?                          mm in size. These polyps were removed with a cold  ?                          biopsy forceps. Resection and retrieval were  ?                          complete. Estimated blood loss was minimal. ?                          Two sessile polyps were found in the cecum. The  ?                          polyps were 2 to 3 mm in size. These polyps were  ?                          removed with a cold snare. Resection and retrieval  ?                          were complete. Estimated blood loss was minimal. ?                          A 20 mm  possible polyp was found at the ileocecal  ?                          valve. The polyp was carpet-like and appeared to  ?                          involved the valve. Biopsies were taken with a cold  ?                          forceps for histology. Estimated blood loss was  ?                          minimal. ?                          There are areas in the colon where the residual  ?  stool is fiercely adherent. Small polyps could be  ?                          missed. The exam was otherwise without abnormality  ?                          on direct and retroflexion views. ?Complications:            No immediate complications. ?Estimated Blood Loss:     Estimated blood loss was minimal. ?Impression:               - Non-bleeding internal hemorrhoids. ?                          - Diverticulosis in the sigmoid colon. ?                          - Four 1 to 2 mm polyps in the transverse colon and  ?                          in the ascending colon, removed with a cold biopsy  ?                          forceps. Resected and retrieved. ?                          - Two 2 to 3 mm polyps in the cecum, removed with a  ?                          cold snare. Resected and retrieved. ?                          - One 20 mm polyp at the ileocecal valve. Biopsied. ?                          - The examination was otherwise normal on direct  ?                          and retroflexion views. Small and flat lesions may  ?                          be missed in areas. ?Recommendation:           - Patient has a contact number available for  ?                          emergencies. The signs and symptoms of potential  ?                          delayed complications were discussed with the  ?                          patient. Return to normal activities tomorrow.  ?  Written discharge instructions were provided to the  ?                          patient. ?                          - High  fiber diet. ?                          - Continue present medications. ?                          - Await pathology results. ?                          - Repeat colonoscopy date to be determined after  ?                          pending pathology results are reviewed for  ?                          surveillance. If the possible IV polyp is confirmed  ?                          to be an adenoma, will discuss EMR with our  ?                          advanced endoscopist to see if surgery can be  ?                          avoided. ?                          - Emerging evidence supports eating a diet of  ?                          fruits, vegetables, grains, calcium, and yogurt  ?                          while reducing red meat and alcohol may reduce the  ?                          risk of colon cancer. ?                          - Thank you for allowing me to be involved in your  ?                          colon cancer prevention. ?Thornton Park MD, MD ?03/01/2022 4:41:32 PM ?This report has been signed electronically. ?

## 2022-03-01 NOTE — Progress Notes (Signed)
Report to PACU, RN, vss, BBS= Clear.  

## 2022-03-01 NOTE — Progress Notes (Signed)
Pt's states no medical or surgical changes since previsit or office visit. 

## 2022-03-01 NOTE — Progress Notes (Signed)
Called to room to assist during endoscopic procedure.  Patient ID and intended procedure confirmed with present staff. Received instructions for my participation in the procedure from the performing physician.  

## 2022-03-01 NOTE — Patient Instructions (Signed)
Please read handouts provided. ?Continue present medications. ?Await pathology results. ?High Fiber Diet. ? ? ?YOU HAD AN ENDOSCOPIC PROCEDURE TODAY AT [facility]:   Refer to the procedure report that was given to you for any specific questions about what was found during the examination.  If the procedure report does not answer your questions, please call your gastroenterologist to clarify.  If you requested that your care partner not be given the details of your procedure findings, then the procedure report has been included in a sealed envelope for you to review at your convenience later. ? ?YOU SHOULD EXPECT: Some feelings of bloating in the abdomen. Passage of more gas than usual.  Walking can help get rid of the air that was put into your GI tract during the procedure and reduce the bloating. If you had a lower endoscopy (such as a colonoscopy or flexible sigmoidoscopy) you may notice spotting of blood in your stool or on the toilet paper. If you underwent a bowel prep for your procedure, you may not have a normal bowel movement for a few days. ? ?Please Note:  You might notice some irritation and congestion in your nose or some drainage.  This is from the oxygen used during your procedure.  There is no need for concern and it should clear up in a day or so. ? ?SYMPTOMS TO REPORT IMMEDIATELY: ? ?Following lower endoscopy (colonoscopy or flexible sigmoidoscopy): ? Excessive amounts of blood in the stool ? Significant tenderness or worsening of abdominal pains ? Swelling of the abdomen that is new, acute ? Fever of 100?F or higher ? ? ? ?For urgent or emergent issues, a gastroenterologist can be reached at any hour by calling [phone]. ?Do not use MyChart messaging for urgent concerns.  ? ? ?DIET:  We do recommend a small meal at first, but then you may proceed to your regular diet.  Drink plenty of fluids but you should avoid alcoholic beverages for 24 hours. ? ?ACTIVITY:  You should  plan to take it easy for the rest of today and you should NOT DRIVE or use heavy machinery until tomorrow (because of the sedation medicines used during the test).   ? ?FOLLOW UP: ?Our staff will call the number listed on your records 48-72 hours following your procedure to check on you and address any questions or concerns that you may have regarding the information given to you following your procedure. If we do not reach you, we will leave a message.  We will attempt to reach you two times.  During this call, we will ask if you have developed any symptoms of COVID 19. If you develop any symptoms (ie: fever, flu-like symptoms, shortness of breath, cough etc.) before then, please call [phone].  If you test positive for Covid 19 in the 2 weeks post procedure, please call and report this information to [place].   ? ?If any biopsies were taken you will be contacted by phone or by letter within the next 1-3 weeks.  Please call us at [phone] if you have not heard about the biopsies in 3 weeks.  ? ? ?SIGNATURES/CONFIDENTIALITY: ?You and/or your care partner have signed paperwork which will be entered into your electronic medical record.  These signatures attest to the fact that that the information above on your After Visit Summary has been reviewed and is understood.  Full responsibility of the confidentiality of this discharge information lies with you and/or your care-partner.  ?

## 2022-03-01 NOTE — Progress Notes (Signed)
? ?Referring Provider: Allwardt, Randa Evens, PA-C ?Primary Care Physician:  Allwardt, Randa Evens, PA-C ? ?Reason for Procedure:  Colon cancer screening ? ? ?IMPRESSION:  ?Need for colon cancer screening ?History of tubular adenoma on colonoscopy 2018 ?Appropriate candidate for monitored anesthesia care ? ?PLAN: ?Colonoscopy in the [place] today ? ? ?HPI: Caitlin Parks is a 76 y.o. female presents for screening colonoscopy. ? 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 - Sharman Crate, MD" ? ?No baseline GI symptoms.  ? ?Colon cancer in her mother at age 13 and paternal grandmother. No other known family history of colon cancer or polyps. No family history of uterine/endometrial cancer, pancreatic cancer or gastric/stomach cancer. ? ? ?Past Medical History:  ?Diagnosis Date  ? Adenomatous colon polyp 2018  ? Allergy   ? SEASONAL  ? Breast cancer in female [facility]) 02/2013  ? Right-sided breast cancer-s/p right mastectomy.  No chemotherapy or radiation.  ? Essential hypertension   ? GERD (gastroesophageal reflux disease)   ? Hyperlipidemia LDL goal <100   ? Documented coronary calcification with mild CAD.  ? Seasonal allergies   ? ? ?Past Surgical History:  ?Procedure Laterality Date  ? COLONOSCOPY    ? LEFT HEART CATH AND CORONARY ANGIOGRAPHY  2021  ? [place], [place] : no flow-limiting lesions.  ? MASTECTOMY Right 02/04/2013  ? PLACEMENT OF BREAST IMPLANTS Right 08/2013  ? POLYPECTOMY    ? TRANSTHORACIC ECHOCARDIOGRAM  2021  ? Per her report, was relatively normal.  ? [place]  ? Reversed in 1981  ? Tubal ligation reversal  1981  ? ? ?Current Outpatient Medications  ?Medication Sig Dispense Refill  ? Ascorbic Acid (VITAMIN C) 500 MG CHEW Chew by mouth  daily.    ? aspirin 81 MG EC tablet Take 1 tablet (81 mg total) by mouth daily. 30 tablet 11  ? Cholecalciferol 25 MCG (1000 UT) tablet Take 2,000 Units by mouth daily.    ? Cyanocobalamin 1000 MCG CAPS Take by mouth daily.    ? ezetimibe (ZETIA) 10 MG tablet Take 1 tablet (10 mg total) by mouth at bedtime. 90 tablet 3  ? fluticasone (FLONASE) 50 MCG/ACT nasal spray Place into the nose daily.    ? isosorbide mononitrate (IMDUR) 30 MG 24 hr tablet Take 1 tablet (30 mg total) by mouth daily. 90 tablet 3  ? losartan (COZAAR) 50 MG tablet Take 1 tablet (50 mg total) by mouth daily. 90 tablet 3  ? metoprolol tartrate (LOPRESSOR) 25 MG tablet Take 1 tablet (25 mg total) by mouth 2 (two) times daily. 180 tablet 3  ? Multiple Vitamins-Minerals (CENTRUM SILVER PO) Take by mouth daily.    ? pantoprazole (PROTONIX) 20 MG tablet Take by mouth every other day.    ? rosuvastatin (CRESTOR) 10 MG tablet Take 1 tablet (10 mg total) by mouth daily. 90 tablet 3  ? amoxicillin-clavulanate (AUGMENTIN) 875-125 MG tablet Take 1 tablet by mouth 2 (two) times daily after a meal. (Patient not taking: Reported on 03/01/2022) 10 tablet 0  ? Fexofenadine HCl (ALLEGRA ALLERGY PO) Take by mouth as needed.    ? fexofenadine-pseudoephedrine (ALLEGRA-D 24) 180-240 MG 24 hr tablet Take 1 tablet by mouth daily.    ? ?Current Facility-Administered Medications  ?Medication Dose Route Frequency Provider Last Rate  Last Admin  ? 0.9 %  sodium chloride infusion  500 mL Intravenous Continuous Thornton Park, MD      ? ? ?Allergies as of 03/01/2022 - Review Complete 03/01/2022  ?Allergen Reaction Noted  ? Erythromycin Diarrhea 09/03/2020  ? Doxycycline Rash and Other (See Comments) 12/23/1997  ? ? ?Family History  ?Problem Relation Age of Onset  ? Colon cancer Mother 52  ? Alzheimer's disease Maternal Grandmother 90  ? Lung cancer Maternal Grandfather   ? Colon cancer Paternal Grandmother   ? Cancer - Colon Paternal Grandmother   ? Diabetes Mellitus II  Paternal Grandfather   ? Esophageal cancer Neg Hx   ? Rectal cancer Neg Hx   ? Stomach cancer Neg Hx   ? ? ? ?Physical Exam: ?General:   Alert,  well-nourished, pleasant and cooperative in NAD ?Head:  Normocephalic and atraumatic. ?Eyes:  Sclera clear, no icterus.   Conjunctiva pink. ?Mouth:  No deformity or lesions.   ?Neck:  Supple; no masses or thyromegaly. ?Lungs:  Clear throughout to auscultation.   No wheezes. ?Heart:  Regular rate and rhythm; no murmurs. ?Abdomen:  Soft, non-tender, nondistended, normal bowel sounds, no rebound or guarding.  ?Msk:  Symmetrical. No boney deformities ?LAD: No inguinal or umbilical LAD ?Extremities:  No clubbing or edema. ?Neurologic:  Alert and  oriented x4;  grossly nonfocal ?Skin:  No obvious rash or bruise. ?Psych:  Alert and cooperative. Normal mood and affect. ? ? ? ? ?Studies/Results: ?No results found. ? ? ? ?Tyrene Nader L. Tarri Glenn, MD, MPH ?03/01/2022, 4:00 PM ? ? ? ?  ?

## 2022-03-03 ENCOUNTER — Telehealth: Payer: Self-pay

## 2022-03-03 NOTE — Telephone Encounter (Signed)
?  Follow up Call- ? ? ?  03/01/2022  ?  3:13 PM  ?Call back number  ?Post procedure Call Back phone  # [phone]  ?Permission to leave phone message Yes  ?  ?1st follow up call made.  NALM ? ? ?

## 2022-03-03 NOTE — Telephone Encounter (Signed)
?  Follow up Call- ? ? ?  03/01/2022  ?  3:13 PM  ?Call back number  ?Post procedure Call Back phone  # [phone]  ?Permission to leave phone message Yes  ?  ? ?2nd follow up call made.  NALM ? ? ?

## 2022-03-10 ENCOUNTER — Telehealth: Payer: Self-pay | Admitting: Gastroenterology

## 2022-03-10 NOTE — Telephone Encounter (Signed)
Patient had a recent colonoscopy and was told she may need to reschedule pending biopsy results.  To date, she has not heard anything and wanted to know how to proceed.  Please call patient and advise.  Thank you. ?

## 2022-03-14 ENCOUNTER — Other Ambulatory Visit: Payer: Self-pay

## 2022-03-14 DIAGNOSIS — D126 Benign neoplasm of colon, unspecified: Secondary | ICD-10-CM

## 2022-03-18 NOTE — Telephone Encounter (Signed)
Called pt to inquire if she still felt she needed the 04/05/22 appt. Declined needing to see Dr. Tarri Glenn. Appt canceled per pt and Dr. Tarri Glenn request. Pt advised her records have been faxed to [facility]. Also informed of referral scheduling process. Verbalized acceptance and understanding. ?

## 2022-03-20 ENCOUNTER — Encounter: Payer: Self-pay | Admitting: Cardiology

## 2022-03-20 NOTE — Assessment & Plan Note (Addendum)
Report reviewed.  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 ? ?Not unreasonable to be on aspirin as well as statin/Zetia.  Also on beta-blocker and losartan. ? ?Can probably try to wean off Imdur. ?I do not suspect that her chest pain that she was having was anginal in nature.  If she does fine off of Imdur, would simply stop. ?

## 2022-03-20 NOTE — Assessment & Plan Note (Signed)
Blood pressure pretty well controlled on low-dose Lopressor and moderate dose losartan. ?

## 2022-03-20 NOTE — Assessment & Plan Note (Signed)
On combination of rosuvastatin which actually had been stopped.  We will refill. ?Also on ezetimibe 10 mg. ?Mild calcific disease in the LAD system otherwise normal LCx & RCA ? ?Should be due for lab check by PCP soon. ?Last lipids evaluated were in May 2022 with LDL 96.  Target LDL for her should be less than 100 with target less than 70. ?

## 2022-03-24 ENCOUNTER — Telehealth: Payer: Self-pay | Admitting: Physician Assistant

## 2022-03-24 NOTE — Telephone Encounter (Signed)
Copied from [place] [id number]. Topic: Medicare AWV ?>> Mar 24, 2022 10:29 AM Harris-Coley, Hannah Beat wrote: ?Reason for CRM: Left message for patient to schedule Annual Wellness Visit.  Please schedule with Nurse Health Advisor Charlott Rakes, RN at [facility].  Please call [phone] ask for Juliann Pulse ?

## 2022-03-29 ENCOUNTER — Ambulatory Visit (INDEPENDENT_AMBULATORY_CARE_PROVIDER_SITE_OTHER): Payer: Medicare Other

## 2022-03-29 DIAGNOSIS — Z0000 Encounter for general adult medical examination without abnormal findings: Secondary | ICD-10-CM | POA: Diagnosis not present

## 2022-03-29 NOTE — Patient Instructions (Addendum)
Ms. Clermont , ?Thank you for taking time to come for your Medicare Wellness Visit. I appreciate your ongoing commitment to your health goals. Please review the following plan we discussed and let me know if I can assist you in the future.  ? ?Screening recommendations/referrals: ?Colonoscopy: Done 03/01/22 repeat every 10 years  ?Mammogram: scheduled for 6/23 ?Bone Density: Scheduled for 06/09/22 ?Recommended yearly ophthalmology/optometry visit for glaucoma screening and checkup ?Recommended yearly dental visit for hygiene and checkup ? ?Vaccinations: ?Influenza vaccine: Up to date per pt  ?Pneumococcal vaccine: Up to date ?Tdap vaccine: Due and discussed  ?Shingles vaccine: Completed 7/19, 08/19/19   ?Covid-19:Completed 1/30, 02/01/20 ? ?Advanced directives: Please bring a copy of your health care power of attorney and living will to the office at your convenience. ? ?Conditions/risks identified: Eat healthy maintain weight and exercise ? ?Next appointment: Follow up in one year for your annual wellness visit  ? ? ?Preventive Care 72 Years and Older, Female ?Preventive care refers to lifestyle choices and visits with your health care provider that can promote health and wellness. ?What does preventive care include? ?A yearly physical exam. This is also called an annual well check. ?Dental exams once or twice a year. ?Routine eye exams. Ask your health care provider how often you should have your eyes checked. ?Personal lifestyle choices, including: ?Daily care of your teeth and gums. ?Regular physical activity. ?Eating a healthy diet. ?Avoiding tobacco and drug use. ?Limiting alcohol use. ?Practicing safe sex. ?Taking low-dose aspirin every day. ?Taking vitamin and mineral supplements as recommended by your health care provider. ?What happens during an annual well check? ?The services and screenings done by your health care provider during your annual well check will depend on your age, overall health, lifestyle risk  factors, and family history of disease. ?Counseling  ?Your health care provider may ask you questions about your: ?Alcohol use. ?Tobacco use. ?Drug use. ?Emotional well-being. ?Home and relationship well-being. ?Sexual activity. ?Eating habits. ?History of falls. ?Memory and ability to understand (cognition). ?Work and work Statistician. ?Reproductive health. ?Screening  ?You may have the following tests or measurements: ?Height, weight, and BMI. ?Blood pressure. ?Lipid and cholesterol levels. These may be checked every 5 years, or more frequently if you are over 75 years old. ?Skin check. ?Lung cancer screening. You may have this screening every year starting at age 61 if you have a 30-pack-year history of smoking and currently smoke or have quit within the past 15 years. ?Fecal occult blood test (FOBT) of the stool. You may have this test every year starting at age 56. ?Flexible sigmoidoscopy or colonoscopy. You may have a sigmoidoscopy every 5 years or a colonoscopy every 10 years starting at age 55. ?Hepatitis C blood test. ?Hepatitis B blood test. ?Sexually transmitted disease (STD) testing. ?Diabetes screening. This is done by checking your blood sugar (glucose) after you have not eaten for a while (fasting). You may have this done every 1-3 years. ?Bone density scan. This is done to screen for osteoporosis. You may have this done starting at age 62. ?Mammogram. This may be done every 1-2 years. Talk to your health care provider about how often you should have regular mammograms. ?Talk with your health care provider about your test results, treatment options, and if necessary, the need for more tests. ?Vaccines  ?Your health care provider may recommend certain vaccines, such as: ?Influenza vaccine. This is recommended every year. ?Tetanus, diphtheria, and acellular pertussis (Tdap, Td) vaccine. You may need a Td  booster every 10 years. ?Zoster vaccine. You may need this after age 56. ?Pneumococcal 13-valent  conjugate (PCV13) vaccine. One dose is recommended after age 31. ?Pneumococcal polysaccharide (PPSV23) vaccine. One dose is recommended after age 75. ?Talk to your health care provider about which screenings and vaccines you need and how often you need them. ?This information is not intended to replace advice given to you by your health care provider. Make sure you discuss any questions you have with your health care provider. ?Document Released: 12/18/2015 Document Revised: 08/10/2016 Document Reviewed: 09/22/2015 ?Elsevier Interactive Patient Education ? 2017 [place]. ? ?Fall Prevention in the Home ?Falls can cause injuries. They can happen to people of all ages. There are many things you can do to make your home safe and to help prevent falls. ?What can I do on the outside of my home? ?Regularly fix the edges of walkways and driveways and fix any cracks. ?Remove anything that might make you trip as you walk through a door, such as a raised step or threshold. ?Trim any bushes or trees on the path to your home. ?Use bright outdoor lighting. ?Clear any walking paths of anything that might make someone trip, such as rocks or tools. ?Regularly check to see if handrails are loose or broken. Make sure that both sides of any steps have handrails. ?Any raised decks and porches should have guardrails on the edges. ?Have any leaves, snow, or ice cleared regularly. ?Use sand or salt on walking paths during winter. ?Clean up any spills in your garage right away. This includes oil or grease spills. ?What can I do in the bathroom? ?Use night lights. ?Install grab bars by the toilet and in the tub and shower. Do not use towel bars as grab bars. ?Use non-skid mats or decals in the tub or shower. ?If you need to sit down in the shower, use a plastic, non-slip stool. ?Keep the floor dry. Clean up any water that spills on the floor as soon as it happens. ?Remove soap buildup in the tub or shower regularly. ?Attach bath mats  securely with double-sided non-slip rug tape. ?Do not have throw rugs and other things on the floor that can make you trip. ?What can I do in the bedroom? ?Use night lights. ?Make sure that you have a light by your bed that is easy to reach. ?Do not use any sheets or blankets that are too big for your bed. They should not hang down onto the floor. ?Have a firm chair that has side arms. You can use this for support while you get dressed. ?Do not have throw rugs and other things on the floor that can make you trip. ?What can I do in the kitchen? ?Clean up any spills right away. ?Avoid walking on wet floors. ?Keep items that you use a lot in easy-to-reach places. ?If you need to reach something above you, use a strong step stool that has a grab bar. ?Keep electrical cords out of the way. ?Do not use floor polish or wax that makes floors slippery. If you must use wax, use non-skid floor wax. ?Do not have throw rugs and other things on the floor that can make you trip. ?What can I do with my stairs? ?Do not leave any items on the stairs. ?Make sure that there are handrails on both sides of the stairs and use them. Fix handrails that are broken or loose. Make sure that handrails are as long as the stairways. ?Check any carpeting  to make sure that it is firmly attached to the stairs. Fix any carpet that is loose or worn. ?Avoid having throw rugs at the top or bottom of the stairs. If you do have throw rugs, attach them to the floor with carpet tape. ?Make sure that you have a light switch at the top of the stairs and the bottom of the stairs. If you do not have them, ask someone to add them for you. ?What else can I do to help prevent falls? ?Wear shoes that: ?Do not have high heels. ?Have rubber bottoms. ?Are comfortable and fit you well. ?Are closed at the toe. Do not wear sandals. ?If you use a stepladder: ?Make sure that it is fully opened. Do not climb a closed stepladder. ?Make sure that both sides of the stepladder  are locked into place. ?Ask someone to hold it for you, if possible. ?Clearly mark and make sure that you can see: ?Any grab bars or handrails. ?First and last steps. ?Where the edge of each step is. ?Use t

## 2022-03-29 NOTE — Progress Notes (Addendum)
Virtual Visit via Telephone Note ? ?I connected with  Caitlin Parks on 03/29/22 at 12:00 PM EDT by telephone and verified that I am speaking with the correct person using two identifiers. ? ?Medicare Annual Wellness visit completed telephonically due to Covid-19 pandemic.  ? ?Persons participating in this call: This Health Coach and this patient.  ? ?Location: ?Patient: home ?Provider: office ?  ?I discussed the limitations, risks, security and privacy concerns of performing an evaluation and management service by telephone and the availability of in person appointments. The patient expressed understanding and agreed to proceed. ? ?Unable to perform video visit due to video visit attempted and failed and/or patient does not have video capability.  ? ?Some vital signs may be absent or patient reported.  ? ?Willette Brace, LPN ? ? ?Subjective:  ? Caitlin Parks is a 76 y.o. female who presents for Medicare Annual (Subsequent) preventive examination. ? ?Review of Systems    ? ?Cardiac Risk Factors include: advanced age (>62mn, >>60women);hypertension;dyslipidemia ? ?   ?Objective:  ?  ?There were no vitals filed for this visit. ?There is no height or weight on file to calculate BMI. ? ? ?  03/29/2022  ? 11:55 AM 07/16/2021  ?  6:18 PM  ?Advanced Directives  ?Does Patient Have a Medical Advance Directive? Yes No  ?Type of [place] will   ?Copy of HWeidmanin Chart? No - copy requested   ? ? ?Current Medications (verified) ?Outpatient Encounter Medications as of 03/29/2022  ?Medication Sig  ? Ascorbic Acid (VITAMIN C) 500 MG CHEW Chew by mouth daily.  ? aspirin 81 MG EC tablet Take 1 tablet (81 mg total) by mouth daily.  ? Cholecalciferol 25 MCG (1000 UT) tablet Take 2,000 Units by mouth daily.  ? Cyanocobalamin 1000 MCG CAPS Take by mouth daily.  ? ezetimibe (ZETIA) 10 MG tablet Take 1 tablet (10 mg total) by mouth at bedtime.  ? Fexofenadine HCl  (ALLEGRA ALLERGY PO) Take by mouth as needed.  ? fluticasone (FLONASE) 50 MCG/ACT nasal spray Place into the nose daily.  ? isosorbide mononitrate (IMDUR) 30 MG 24 hr tablet Take 1 tablet (30 mg total) by mouth daily.  ? losartan (COZAAR) 50 MG tablet Take 1 tablet (50 mg total) by mouth daily.  ? metoprolol tartrate (LOPRESSOR) 25 MG tablet Take 1 tablet (25 mg total) by mouth 2 (two) times daily.  ? Multiple Vitamins-Minerals (CENTRUM SILVER PO) Take by mouth daily.  ? pantoprazole (PROTONIX) 20 MG tablet Take by mouth every other day.  ? rosuvastatin (CRESTOR) 10 MG tablet Take 1 tablet (10 mg total) by mouth daily.  ? [DISCONTINUED] amoxicillin-clavulanate (AUGMENTIN) 875-125 MG tablet Take 1 tablet by mouth 2 (two) times daily after a meal. (Patient not taking: Reported on 03/01/2022)  ? [DISCONTINUED] metoprolol tartrate (LOPRESSOR) 25 MG tablet Take 25 mg by mouth 2 (two) times daily.  ? ?No facility-administered encounter medications on file as of 03/29/2022.  ? ? ?Allergies (verified) ?Erythromycin and Doxycycline  ? ?History: ?Past Medical History:  ?Diagnosis Date  ? Adenomatous colon polyp 2018  ? Allergy   ? SEASONAL  ? Breast cancer in female ([facility] 02/2013  ? Right-sided breast cancer-s/p right mastectomy.  No chemotherapy or radiation.  ? Essential hypertension   ? GERD (gastroesophageal reflux disease)   ? Hyperlipidemia LDL goal <100   ? Documented coronary calcification with mild CAD.  ? Seasonal allergies   ? ?Past Surgical  History:  ?Procedure Laterality Date  ? COLONOSCOPY    ? LEFT HEART CATH AND CORONARY ANGIOGRAPHY  07/14/2020  ? [place], [place] : no flow-limiting lesions.  Proximal LAD calcified lesion: FFR 0.95.  D1 to Strader bifurcation 50% stenosis.  Normal RCA and LCx.  ? MASTECTOMY Right 02/04/2013  ? [place] [facility]  07/2020  ? Read as is "abnormal"-referred for cath that was normal.  ? PLACEMENT OF BREAST IMPLANTS Right 08/2013  ? POLYPECTOMY    ? TRANSTHORACIC ECHOCARDIOGRAM   2021  ? [place], [place]) normal EF with no RWMA.  ? TUBAL LIGATION  1973  ? Reversed in 1981  ? Tubal ligation reversal  1981  ? ?Family History  ?Problem Relation Age of Onset  ? Colon cancer Mother 47  ? Alzheimer's disease Maternal Grandmother 90  ? Lung cancer Maternal Grandfather   ? Colon cancer Paternal Grandmother   ? Cancer - Colon Paternal Grandmother   ? Diabetes Mellitus II Paternal Grandfather   ? Esophageal cancer Neg Hx   ? Rectal cancer Neg Hx   ? Stomach cancer Neg Hx   ? ?Social History  ? ?Socioeconomic History  ? Marital status: Widowed  ?  Spouse name: Not on file  ? Number of children: 3  ? Years of education: Not on file  ? Highest education level: Not on file  ?Occupational History  ? Not on file  ?Tobacco Use  ? Smoking status: Never  ?  Passive exposure: Past  ? Smokeless tobacco: Never  ?Vaping Use  ? Vaping Use: Never used  ?Substance and Sexual Activity  ? Alcohol use: Yes  ?  Alcohol/week: 1.0 standard drink  ?  Types: 1 Glasses of wine per week  ?  Comment: 2 GLASSES PER WEEK  ? Drug use: Never  ? Sexual activity: Not Currently  ?Other Topics Concern  ? Not on file  ?Social History Narrative  ? Widowed mother of 3 (1 son, 2 daughters ->   ? 1 daughter lives here in [place] area along with her granddaughter, other daughter lives in [place] when she left [place], she first went to [place], but Subsequently Moved Back E. and Further S. to [place] to be closer to her granddaughter and daughter here locally.  Son also lives relatively close.  ?   ? Caffeine: 2 servings daily-1 coffee, 1 herbal tea.  ?   ? Diet: Minimizes red meat, eats lots of fruits and vegetables, chicken and fish.  Drinks lots of water (almost 80 ounces).  ?   ? Exercise: Walks routinely 5 days a week, and started going to a dance class and exercise class.  ? ?Social Determinants of Health  ? ?Financial Resource Strain: Low Risk   ? Difficulty of Paying Living Expenses: Not hard at all  ?Food  Insecurity: No Food Insecurity  ? Worried About Charity fundraiser in the Last Year: Never true  ? Ran Out of Food in the Last Year: Never true  ?Transportation Needs: No Transportation Needs  ? Lack of Transportation (Medical): No  ? Lack of Transportation (Non-Medical): No  ?Physical Activity: Sufficiently Active  ? Days of Exercise per Week: 4 days  ? Minutes of Exercise per Session: 60 min  ?Stress: No Stress Concern Present  ? Feeling of Stress : Not at all  ?Social Connections: Moderately Integrated  ? Frequency of Communication with Friends and Family: More than three times a week  ? Frequency of Social  Gatherings with Friends and Family: More than three times a week  ? Attends Religious Services: More than 4 times per year  ? Active Member of Clubs or Organizations: Yes  ? Attends Archivist Meetings: 1 to 4 times per year  ? Marital Status: Widowed  ? ? ?Tobacco Counseling ?Counseling given: Not Answered ? ? ?Clinical Intake: ? ?Pre-visit preparation completed: Yes ? ?Pain : No/denies pain ? ?  ? ?BMI - recorded: 23.58 ?Nutritional Status: BMI of 19-24  Normal ?Nutritional Risks: None ?Diabetes: No ? ?How often do you need to have someone help you when you read instructions, pamphlets, or other written materials from your doctor or pharmacy?: 1 - Never ? ?Diabetic?no ? ?Interpreter Needed?: No ? ?Information entered by :: Charlott Rakes, LPN ? ? ?Activities of Daily Living ? ?  03/29/2022  ? 11:56 AM 03/28/2022  ? 10:41 AM  ?In your present state of health, do you have any difficulty performing the following activities:  ?Hearing? 1 0  ?Comment slight HOH   ?Vision? 0 0  ?Difficulty concentrating or making decisions? 0 0  ?Walking or climbing stairs? 0 0  ?Dressing or bathing? 0 0  ?Doing errands, shopping? 0 0  ?Preparing Food and eating ? N N  ?Using the Toilet? N N  ?In the past six months, have you accidently leaked urine? N N  ?Do you have problems with loss of bowel control? N N  ?Managing  your Medications? N N  ?Managing your Finances? N N  ?Housekeeping or managing your Housekeeping? N N  ? ? ?Patient Care Team: ?Allwardt, Randa Evens, PA-C as PCP - General (Physician Assistant) ?Leonie Man, MD

## 2022-04-04 DIAGNOSIS — D122 Benign neoplasm of ascending colon: Secondary | ICD-10-CM | POA: Diagnosis not present

## 2022-04-04 DIAGNOSIS — D126 Benign neoplasm of colon, unspecified: Secondary | ICD-10-CM | POA: Diagnosis not present

## 2022-04-05 ENCOUNTER — Ambulatory Visit: Payer: Medicare Other | Admitting: Gastroenterology

## 2022-04-12 ENCOUNTER — Telehealth: Payer: Self-pay | Admitting: Genetic Counselor

## 2022-04-12 NOTE — Telephone Encounter (Signed)
Scheduled appt per 5/9 referral. Pt is aware of appt date and time. Pt is aware to arrive 15 mins prior to appt time and to bring and updated insurance card. Pt is aware of appt location.   ?

## 2022-04-13 ENCOUNTER — Inpatient Hospital Stay: Payer: Medicare Other | Attending: Oncology | Admitting: Genetic Counselor

## 2022-04-13 ENCOUNTER — Other Ambulatory Visit: Payer: Self-pay | Admitting: Genetic Counselor

## 2022-04-13 ENCOUNTER — Inpatient Hospital Stay: Payer: Medicare Other

## 2022-04-13 ENCOUNTER — Other Ambulatory Visit: Payer: Self-pay

## 2022-04-13 DIAGNOSIS — Z800 Family history of malignant neoplasm of digestive organs: Secondary | ICD-10-CM | POA: Diagnosis not present

## 2022-04-13 DIAGNOSIS — C50919 Malignant neoplasm of unspecified site of unspecified female breast: Secondary | ICD-10-CM | POA: Diagnosis not present

## 2022-04-13 DIAGNOSIS — Z853 Personal history of malignant neoplasm of breast: Secondary | ICD-10-CM | POA: Diagnosis not present

## 2022-04-13 DIAGNOSIS — Z803 Family history of malignant neoplasm of breast: Secondary | ICD-10-CM | POA: Diagnosis not present

## 2022-04-13 DIAGNOSIS — D126 Benign neoplasm of colon, unspecified: Secondary | ICD-10-CM

## 2022-04-13 LAB — GENETIC SCREENING ORDER

## 2022-04-14 ENCOUNTER — Encounter: Payer: Self-pay | Admitting: Genetic Counselor

## 2022-04-14 DIAGNOSIS — Z800 Family history of malignant neoplasm of digestive organs: Secondary | ICD-10-CM | POA: Insufficient documentation

## 2022-04-14 DIAGNOSIS — Z803 Family history of malignant neoplasm of breast: Secondary | ICD-10-CM | POA: Insufficient documentation

## 2022-04-14 NOTE — Progress Notes (Signed)
REFERRING PROVIDER: ?Ileana Roup, MD ?[address] ?[place],  [place] [address] ? ?PRIMARY PROVIDER:  ?Allwardt, Randa Evens, PA-C ? ?PRIMARY REASON FOR VISIT:  ?1. Family history of breast cancer   ?2. Adenomatous polyp of colon, unspecified part of colon   ?3. Malignant neoplasm of female breast, unspecified estrogen receptor status, unspecified laterality, unspecified site of breast ([place])   ?4. Family history of colon cancer   ? ? ? ?HISTORY OF PRESENT ILLNESS:   ?Caitlin Parks, a 76 y.o. female, was seen for a [place] cancer genetics consultation at the request of Dr. Dema Severin due to a personal and family history of cancer and a personal history of polyposis.  Caitlin Parks presents to clinic today to discuss the possibility of a hereditary predisposition to cancer, genetic testing, and to further clarify her future cancer risks, as well as potential cancer risks for family members.  ? ?In 2014, at the age of 66, Caitlin Parks was diagnosed with cancer of the right breast. The treatment plan included a unilateral mastectomy, no radiation or chemotherapy.  ?   ? ?CANCER HISTORY:  ?Oncology History  ? No history exists.  ? ? ? ?RISK FACTORS:  ?Menarche was at age 55.  ?First live birth at age 85.  ?OCP use for approximately 0 years.  ?Ovaries intact: yes.  ?Hysterectomy: no.  ?Menopausal status: postmenopausal.  ?HRT use:  OTC HRT for 5  years. ?Colonoscopy: yes;  polyposis - last colonoscopy found 7 polyps, total lifetime polyps greater than 10 . ?Mammogram within the last year: yes. ?Number of breast biopsies: 1. ?Up to date with pelvic exams: yes. ?Any excessive radiation exposure in the past: no ? ?Past Medical History:  ?Diagnosis Date  ? Adenomatous colon polyp 2018  ? Allergy   ? SEASONAL  ? Breast cancer in female [facility]) 02/2013  ? Right-sided breast cancer-s/p right mastectomy.  No chemotherapy or radiation.  ? Essential hypertension   ? Family history of breast cancer   ? Family history of colon cancer    ? GERD (gastroesophageal reflux disease)   ? Hyperlipidemia LDL goal <100   ? Documented coronary calcification with mild CAD.  ? Seasonal allergies   ? ? ?Past Surgical History:  ?Procedure Laterality Date  ? COLONOSCOPY    ? LEFT HEART CATH AND CORONARY ANGIOGRAPHY  07/14/2020  ? [place], [place] : no flow-limiting lesions.  Proximal LAD calcified lesion: FFR 0.95.  D1 to Strader bifurcation 50% stenosis.  Normal RCA and LCx.  ? MASTECTOMY Right 02/04/2013  ? NM MYOVIEW LTD  07/2020  ? Read as is "abnormal"-referred for cath that was normal.  ? PLACEMENT OF BREAST IMPLANTS Right 08/2013  ? POLYPECTOMY    ? TRANSTHORACIC ECHOCARDIOGRAM  2021  ? [place], [place]) normal EF with no RWMA.  ? TUBAL LIGATION  1973  ? Reversed in 1981  ? Tubal ligation reversal  1981  ? ? ?Social History  ? ?Socioeconomic History  ? Marital status: Widowed  ?  Spouse name: Not on file  ? Number of children: 3  ? Years of education: Not on file  ? Highest education level: Not on file  ?Occupational History  ? Not on file  ?Tobacco Use  ? Smoking status: Never  ?  Passive exposure: Past  ? Smokeless tobacco: Never  ?Vaping Use  ? Vaping Use: Never used  ?Substance and Sexual Activity  ? Alcohol use: Yes  ?  Alcohol/week: 1.0 standard drink  ?  Types: 1 Glasses of wine per week  ?  Comment: 2 GLASSES PER WEEK  ? Drug use: Never  ? Sexual activity: Not Currently  ?Other Topics Concern  ? Not on file  ?Social History Narrative  ? Widowed mother of 3 (1 son, 2 daughters ->   ? 1 daughter lives here in [place] area along with her granddaughter, other daughter lives in [place] when she left [place], she first went to [place], but Subsequently Moved Back E. and Further S. to [place] to be closer to her granddaughter and daughter here locally.  Son also lives relatively close.  ?   ? Caffeine: 2 servings daily-1 coffee, 1 herbal tea.  ?   ? Diet: Minimizes red meat, eats lots of fruits and vegetables, chicken and fish.   Drinks lots of water (almost 80 ounces).  ?   ? Exercise: Walks routinely 5 days a week, and started going to a dance class and exercise class.  ? ?Social Determinants of Health  ? ?Financial Resource Strain: Low Risk   ? Difficulty of Paying Living Expenses: Not hard at all  ?Food Insecurity: No Food Insecurity  ? Worried About Charity fundraiser in the Last Year: Never true  ? Ran Out of Food in the Last Year: Never true  ?Transportation Needs: No Transportation Needs  ? Lack of Transportation (Medical): No  ? Lack of Transportation (Non-Medical): No  ?Physical Activity: Sufficiently Active  ? Days of Exercise per Week: 4 days  ? Minutes of Exercise per Session: 60 min  ?Stress: No Stress Concern Present  ? Feeling of Stress : Not at all  ?Social Connections: Moderately Integrated  ? Frequency of Communication with Friends and Family: More than three times a week  ? Frequency of Social Gatherings with Friends and Family: More than three times a week  ? Attends Religious Services: More than 4 times per year  ? Active Member of Clubs or Organizations: Yes  ? Attends Archivist Meetings: 1 to 4 times per year  ? Marital Status: Widowed  ?  ? ?FAMILY HISTORY:  ?We obtained a detailed, 4-generation family history.  Significant diagnoses are listed below: ?Family History  ?Problem Relation Age of Onset  ? Colon cancer Mother 69  ? Cancer Maternal Uncle   ?     NOS  ? Cancer Paternal Aunt   ?     2 PAs with unknown cancer  ? Alzheimer's disease Maternal Grandmother 90  ? Lung cancer Maternal Grandfather   ? Colon cancer Paternal Grandmother   ? Cancer - Colon Paternal Grandmother   ? Diabetes Mellitus II Paternal Grandfather   ? Breast cancer Other   ?     MGFs sister  ? Esophageal cancer Neg Hx   ? Rectal cancer Neg Hx   ? Stomach cancer Neg Hx   ? ? ? ? ?The patient has a son and twin daughters who are cancer free.  She is an only child.  Both parents are deceased. ? ?The patients mother had colon cancer at  26.  She had three sisters and three brothers.  One brother had an unknown cancer  The maternal grandparents are deceased.  The grandfather had lung cancer.  He had two sisters with cancer, one with breast cancer and the other is unknown. ? ?The patient's father died in a car accident at 10.  He had two brothers and three sisters.  Two sisters had unknown cancers.  The paternal grandmother had colon cancer at 29.   ? ?Caitlin Parks is unaware of previous family history of genetic testing for hereditary cancer risks. Patient's maternal ancestors are of Caucasian, Native [place] and [place] American descent, and paternal ancestors are of African American descent. There is no reported Ashkenazi Jewish ancestry. There is no known consanguinity. ? ?GENETIC COUNSELING ASSESSMENT: Caitlin Parks is a 76 y.o. female with a personal and family history of cancer and a personal history of polyposis which is somewhat suggestive of a hereditary cancer syndrome and predisposition to cancer given the polyposis. We, therefore, discussed and recommended the following at today's visit.  ? ?DISCUSSION: We discussed that, in general, most cancer is not inherited in families, but instead is sporadic or familial. Sporadic cancers occur by chance and typically happen at older ages (>50 years) as this type of cancer is caused by genetic changes acquired during an individual?s lifetime. Some families have more cancers than would be expected by chance; however, the ages or types of cancer are not consistent with a known genetic mutation or known genetic mutations have been ruled out. This type of familial cancer is thought to be due to a combination of multiple genetic, environmental, hormonal, and lifestyle factors. While this combination of factors likely increases the risk of cancer, the exact source of this risk is not currently identifiable or testable. ? ?We discussed that many people have polyps, but most have fewer than 5.  When an  individual has 10 or more polyps in their lifetime there is an increased chance that there could be a genetic cause to that polyposis, as well as an increased risk for colon cancer.  Most cases of polyp

## 2022-04-15 ENCOUNTER — Encounter: Payer: Self-pay | Admitting: Physician Assistant

## 2022-04-15 ENCOUNTER — Ambulatory Visit (INDEPENDENT_AMBULATORY_CARE_PROVIDER_SITE_OTHER): Payer: Medicare Other | Admitting: Physician Assistant

## 2022-04-15 VITALS — BP 140/80 | HR 61 | Temp 97.2°F | Ht 66.0 in | Wt 147.6 lb

## 2022-04-15 DIAGNOSIS — M25552 Pain in left hip: Secondary | ICD-10-CM | POA: Diagnosis not present

## 2022-04-15 MED ORDER — MELOXICAM 15 MG PO TABS
15.0000 mg | ORAL_TABLET | Freq: Every day | ORAL | 0 refills | Status: AC
Start: 2022-04-15 — End: 2022-04-30

## 2022-04-15 NOTE — Progress Notes (Signed)
? ?Subjective:  ? ? Patient ID: Caitlin Parks, female    DOB: February 21, 1946, 76 y.o.   MRN: [id number] ? ?Chief Complaint  ?Patient presents with  ? Hip Pain  ?  Left; starting 2 months ago. She states that she can not walk far distances. She has taken OTC Tylenol for pain, but has not subsided.  ? ? ?HPI ?Patient is in today for left hip pain worse in the last 2 months. Worse with line-dancing class (stopped this). Not walking as far. Sometimes hurts at night. Hurts to sleep on this side. Tylenol for pain helps some.  ?PT about 4-5 years ago.  ?Imaged "a long time ago." Can't remember diagnosis.  ? ?Past Medical History:  ?Diagnosis Date  ? Adenomatous colon polyp 2018  ? Allergy   ? SEASONAL  ? Breast cancer in female [facility]) 02/2013  ? Right-sided breast cancer-s/p right mastectomy.  No chemotherapy or radiation.  ? Essential hypertension   ? Family history of breast cancer   ? Family history of colon cancer   ? GERD (gastroesophageal reflux disease)   ? Hyperlipidemia LDL goal <100   ? Documented coronary calcification with mild CAD.  ? Seasonal allergies   ? ? ?Past Surgical History:  ?Procedure Laterality Date  ? COLONOSCOPY    ? LEFT HEART CATH AND CORONARY ANGIOGRAPHY  07/14/2020  ? [place], [place] : no flow-limiting lesions.  Proximal LAD calcified lesion: FFR 0.95.  D1 to Strader bifurcation 50% stenosis.  Normal RCA and LCx.  ? MASTECTOMY Right 02/04/2013  ? NM MYOVIEW LTD  07/2020  ? Read as is "abnormal"-referred for cath that was normal.  ? PLACEMENT OF BREAST IMPLANTS Right 08/2013  ? POLYPECTOMY    ? TRANSTHORACIC ECHOCARDIOGRAM  2021  ? [place], [place]) normal EF with no RWMA.  ? TUBAL LIGATION  1973  ? Reversed in 1981  ? Tubal ligation reversal  1981  ? ? ?Family History  ?Problem Relation Age of Onset  ? Colon cancer Mother 32  ? Cancer Maternal Uncle   ?     NOS  ? Cancer Paternal Aunt   ?     2 PAs with unknown cancer  ? Alzheimer's disease Maternal Grandmother 90  ? Lung  cancer Maternal Grandfather   ? Colon cancer Paternal Grandmother   ? Cancer - Colon Paternal Grandmother   ? Diabetes Mellitus II Paternal Grandfather   ? Breast cancer Other   ?     MGFs sister  ? Esophageal cancer Neg Hx   ? Rectal cancer Neg Hx   ? Stomach cancer Neg Hx   ? ? ?Social History  ? ?Tobacco Use  ? Smoking status: Never  ?  Passive exposure: Past  ? Smokeless tobacco: Never  ?Vaping Use  ? Vaping Use: Never used  ?Substance Use Topics  ? Alcohol use: Yes  ?  Alcohol/week: 1.0 standard drink  ?  Types: 1 Glasses of wine per week  ?  Comment: 2 GLASSES PER WEEK  ? Drug use: Never  ?  ? ?Allergies  ?Allergen Reactions  ? Erythromycin Diarrhea  ? Doxycycline Rash and Other (See Comments)  ? ? ?Review of Systems ?NEGATIVE UNLESS OTHERWISE INDICATED IN HPI ? ? ?   ?Objective:  ?  ? ?BP 140/80 (BP Location: Left Arm, Patient Position: Sitting, Cuff Size: Normal)   Pulse 61   Temp (!) 97.2 ?F (36.2 ?C) (Temporal)   Ht '5\' 6"'$  (1.676 m)   Wt  147 lb 9.6 oz (67 kg)   SpO2 98%   BMI 23.82 kg/m?  ? ?Wt Readings from Last 3 Encounters:  ?04/15/22 147 lb 9.6 oz (67 kg)  ?03/01/22 146 lb (66.2 kg)  ?02/28/22 150 lb 6.1 oz (68.2 kg)  ? ? ?BP Readings from Last 3 Encounters:  ?04/15/22 140/80  ?03/01/22 (!) 128/59  ?02/28/22 126/80  ?  ? ?Physical Exam ?Musculoskeletal:  ?   Left hip: Tenderness (along anterior hip line.  Not noted to be tender along the trochanteric bursa.) present. No deformity. Normal range of motion. Normal strength.  ?Neurological:  ?   Sensory: Sensation is intact.  ?   Motor: Motor function is intact.  ?   Coordination: Coordination is intact.  ? ? ?   ?Assessment & Plan:  ? ?Problem List Items Addressed This Visit   ? ?  ? Other  ? Left hip pain - Primary  ? Relevant Orders  ? DG HIP UNILAT W OR W/O PELVIS 2-3 VIEWS LEFT  ? ? ? ?Meds ordered this encounter  ?Medications  ? meloxicam (MOBIC) 15 MG tablet  ?  Sig: Take 1 tablet (15 mg total) by mouth daily for 15 days.  ?  Dispense:  15  tablet  ?  Refill:  0  ?  Order Specific Question:   Supervising Provider  ?  Answer:   Marin Olp [[id number]]  ? ? ?This note was prepared with assistance of Systems analyst. Occasional wrong-word or sound-a-like substitutions may have occurred due to the inherent limitations of voice recognition software. ? ? ?Leno Mathes M Annaleia Pence, PA-C ?

## 2022-04-15 NOTE — Patient Instructions (Signed)
Good to see you today! ?Please go to [facility] for XRAY of Left hip. (See attached) ? ?Start on Meloxicam 15 mg daily with food.  Do not take any other NSAIDs with meloxicam. ?You may continue to take Tylenol. ? ?See you back in 2 weeks for follow-up.  Call sooner if any other immediate concerns. ?

## 2022-04-19 ENCOUNTER — Ambulatory Visit: Payer: Medicare Other | Admitting: Physician Assistant

## 2022-04-20 DIAGNOSIS — M25552 Pain in left hip: Secondary | ICD-10-CM | POA: Insufficient documentation

## 2022-04-20 NOTE — Assessment & Plan Note (Signed)
Could be secondary to recent line dancing. ?  Flareup of osteoarthritis.  Check x-ray of left hip.  Plan to start on meloxicam 15 mg daily with food.  Follow-up in approximately 2 weeks.  She might need PT if worse or no improvement. ?

## 2022-05-03 NOTE — Progress Notes (Signed)
Sent message, via epic in basket, requesting orders in epic from surgeon.  

## 2022-05-04 ENCOUNTER — Other Ambulatory Visit (HOSPITAL_COMMUNITY): Payer: Self-pay

## 2022-05-06 ENCOUNTER — Telehealth: Payer: Self-pay | Admitting: Genetic Counselor

## 2022-05-06 ENCOUNTER — Ambulatory Visit: Payer: Self-pay | Admitting: Genetic Counselor

## 2022-05-06 ENCOUNTER — Encounter: Payer: Self-pay | Admitting: Genetic Counselor

## 2022-05-06 DIAGNOSIS — Z1379 Encounter for other screening for genetic and chromosomal anomalies: Secondary | ICD-10-CM

## 2022-05-06 NOTE — Telephone Encounter (Signed)
Revealed negative genetic testing.  Discussed that we do not know why she has breast cancer and polyposis or why there is cancer in the family. It could be due to a different gene that we are not testing, or maybe our current technology may not be able to pick something up.  It will be important for her to keep in contact with genetics to keep up with whether additional testing may be needed.

## 2022-05-06 NOTE — Progress Notes (Signed)
HPI:  Ms. Waxman was previously seen in the [facility] clinic due to a personal and family history of cancer and concerns regarding a hereditary predisposition to cancer. Please refer to our prior cancer genetics clinic note for more information regarding our discussion, assessment and recommendations, at the time. Ms. Staebell recent genetic test results were disclosed to her, as were recommendations warranted by these results. These results and recommendations are discussed in more detail below.  CANCER HISTORY:  Oncology History  Breast cancer in female [facility])  02/2013 Initial Diagnosis   Breast cancer in female [facility]   05/04/2022 Genetic Testing   Negative genetic testing on the CancerNext-Expanded+RNAinsight panel.  The report date is May 04, 2022.  The CancerNext-Expanded gene panel offered by [facility] and includes sequencing and rearrangement analysis for the following 77 genes: AIP, ALK, APC*, ATM*, AXIN2, BAP1, BARD1, BLM, BMPR1A, BRCA1*, BRCA2*, BRIP1*, CDC73, CDH1*, CDK4, CDKN1B, CDKN2A, CHEK2*, CTNNA1, DICER1, FANCC, FH, FLCN, GALNT12, KIF1B, LZTR1, MAX, MEN1, MET, MLH1*, MSH2*, MSH3, MSH6*, MUTYH*, NBN, NF1*, NF2, NTHL1, PALB2*, PHOX2B, PMS2*, POT1, PRKAR1A, PTCH1, PTEN*, RAD51C*, RAD51D*, RB1, RECQL, RET, SDHA, SDHAF2, SDHB, SDHC, SDHD, SMAD4, SMARCA4, SMARCB1, SMARCE1, STK11, SUFU, TMEM127, TP53*, TSC1, TSC2, VHL and XRCC2 (sequencing and deletion/duplication); EGFR, EGLN1, HOXB13, KIT, MITF, PDGFRA, POLD1, and POLE (sequencing only); EPCAM and GREM1 (deletion/duplication only). DNA and RNA analyses performed for * genes.      FAMILY HISTORY:  We obtained a detailed, 4-generation family history.  Significant diagnoses are listed below: Family History  Problem Relation Age of Onset   Colon cancer Mother 28   Cancer Maternal Uncle        NOS   Cancer Paternal Aunt        2 PAs with unknown cancer   Alzheimer's disease Maternal Grandmother 4   Lung cancer  Maternal Grandfather    Colon cancer Paternal Grandmother    Cancer - Colon Paternal Grandmother    Diabetes Mellitus II Paternal Grandfather    Breast cancer Other        MGFs sister   Esophageal cancer Neg Hx    Rectal cancer Neg Hx    Stomach cancer Neg Hx       The patient has a son and twin daughters who are cancer free.  She is an only child.  Both parents are deceased.   The patients mother had colon cancer at 14.  She had three sisters and three brothers.  One brother had an unknown cancer  The maternal grandparents are deceased.  The grandfather had lung cancer.  He had two sisters with cancer, one with breast cancer and the other is unknown.   The patient's father died in a car accident at 75.  He had two brothers and three sisters.  Two sisters had unknown cancers.  The paternal grandmother had colon cancer at 51.     Ms. Huge is unaware of previous family history of genetic testing for hereditary cancer risks. Patient's maternal ancestors are of Caucasian, Native [place] and [place] American descent, and paternal ancestors are of African American descent. There is no reported Ashkenazi Jewish ancestry. There is no known consanguinity  GENETIC TEST RESULTS: Genetic testing reported out on May 06, 2022 through the CancerNext-Expanded+RNAinsight cancer panel found no pathogenic mutations. The CancerNext-Expanded gene panel offered by [facility] and includes sequencing and rearrangement analysis for the following 77 genes: AIP, ALK, APC*, ATM*, AXIN2, BAP1, BARD1, BLM, BMPR1A, BRCA1*, BRCA2*, BRIP1*, CDC73, CDH1*, CDK4, CDKN1B,  CDKN2A, CHEK2*, CTNNA1, DICER1, FANCC, FH, FLCN, GALNT12, KIF1B, LZTR1, MAX, MEN1, MET, MLH1*, MSH2*, MSH3, MSH6*, MUTYH*, NBN, NF1*, NF2, NTHL1, PALB2*, PHOX2B, PMS2*, POT1, PRKAR1A, PTCH1, PTEN*, RAD51C*, RAD51D*, RB1, RECQL, RET, SDHA, SDHAF2, SDHB, SDHC, SDHD, SMAD4, SMARCA4, SMARCB1, SMARCE1, STK11, SUFU, TMEM127, TP53*, TSC1, TSC2, VHL and XRCC2  (sequencing and deletion/duplication); EGFR, EGLN1, HOXB13, KIT, MITF, PDGFRA, POLD1, and POLE (sequencing only); EPCAM and GREM1 (deletion/duplication only). DNA and RNA analyses performed for * genes. The test report has been scanned into EPIC and is located under the Molecular Pathology section of the Results Review tab.  A portion of the result report is included below for reference.      We discussed with Ms. Modisette that because current genetic testing is not perfect, it is possible there may be a gene mutation in one of these genes that current testing cannot detect, but that chance is small.  We also discussed, that there could be another gene that has not yet been discovered, or that we have not yet tested, that is responsible for the cancer diagnoses in the family. It is also possible there is a hereditary cause for the cancer in the family that Ms. Cielo did not inherit and therefore was not identified in her testing.  Therefore, it is important to remain in touch with cancer genetics in the future so that we can continue to offer Ms. Zimmers the most up to date genetic testing.   ADDITIONAL GENETIC TESTING: We discussed with Ms. Drakeford that her genetic testing was fairly extensive.  If there are genes identified to increase cancer risk that can be analyzed in the future, we would be happy to discuss and coordinate this testing at that time.    CANCER SCREENING RECOMMENDATIONS: Ms. Graven test result is considered negative (normal).  This means that we have not identified a hereditary cause for her personal and family history of cancer at this time. Most cancers happen by chance and this negative test suggests that her cancer may fall into this category.    While reassuring, this does not definitively rule out a hereditary predisposition to cancer. It is still possible that there could be genetic mutations that are undetectable by current technology. There could be genetic mutations  in genes that have not been tested or identified to increase cancer risk.  Therefore, it is recommended she continue to follow the cancer management and screening guidelines provided by her oncology and primary healthcare provider.   An individual's cancer risk and medical management are not determined by genetic test results alone. Overall cancer risk assessment incorporates additional factors, including personal medical history, family history, and any available genetic information that may result in a personalized plan for cancer prevention and surveillance  RECOMMENDATIONS FOR FAMILY MEMBERS:  Individuals in this family might be at some increased risk of developing cancer, over the general population risk, simply due to the family history of cancer.  We recommended women in this family have a yearly mammogram beginning at age 71, or 50 years younger than the earliest onset of cancer, an annual clinical breast exam, and perform monthly breast self-exams. Women in this family should also have a gynecological exam as recommended by their primary provider. All family members should be referred for colonoscopy starting at age 15.  FOLLOW-UP: Lastly, we discussed with Ms. Magwood that cancer genetics is a rapidly advancing field and it is possible that new genetic tests will be appropriate for her and/or her family members in the  future. We encouraged her to remain in contact with cancer genetics on an annual basis so we can update her personal and family histories and let her know of advances in cancer genetics that may benefit this family.   Our contact number was provided. Ms. Downen questions were answered to her satisfaction, and she knows she is welcome to call us at anytime with additional questions or concerns.   Roma Kayser, [place], [facility] Licensed, Certified Genetic Counselor [email or web address]

## 2022-05-06 NOTE — Progress Notes (Addendum)
Anesthesia Review:  PCP: alyssa allwart- LOV 04/20/22  Cardiologist : DR Glenetta Hew- Lov 02/16/22  Chest x-ray : 01/20/22  EKG : 10/18/21  Echo : Stress test: Cardiac Cath :  Activity level:  Sleep Study/ CPAP : Fasting Blood Sugar :      / Checks Blood Sugar -- times a day:   Blood Thinner/ Instructions /Last Dose: ASA / Instructions/ Last Dose :   81 mg aspirin  NO orders in at preop appt.  Called and requested orders on 05/09/22 and on 05/10/22.   AT time of preop appt pt states she has bowel prep instructions per MD. Instructed pt to follow bowel prep instructions per MD.  PT voiced understanding.   CBC done 05/10/22 with white count of 2.5 routed to Dr Annye English.  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   LOV 01/19/22. Janett Billow Ward,PAc aware on 05/10/22.  Orders placd in epic by MD after pt had been seen on preop appt.  Glucose was 68 on labs at preop appt.  Called pt and informed her of this.  PT stated she had eaten since preop appt.   Also informed pt that Dr Dema Severin had placed orders in for Ensure presurgery drinks.   PT to pick up presurgery Ensure drinks on 05/11/22 between 100pm and 230pm..  Pt picked up presurgery ensure dirnks on 05/11/22 and preop nurse reviewed instructions with pt and pt voiced understanding.

## 2022-05-09 NOTE — Progress Notes (Signed)
DUE TO COVID-19 ONLY  2  VISITOR IS ALLOWED TO COME WITH YOU AND STAY IN THE WAITING ROOM ONLY DURING PRE OP AND PROCEDURE DAY OF SURGERY.   4  VISITOR  MAY VISIT WITH YOU AFTER SURGERY IN YOUR PRIVATE ROOM DURING VISITING HOURS ONLY! YOU MAY HAVE ONE PERSON SPEND THE NITE WITH YOU IN YOUR ROOM AFTER SURGERY.       Your procedure is scheduled on:            05/18/2022   Report to [facility] Main  Entrance   Report to admitting at      0745             AM DO NOT [place], PICTURE ID OR WALLET DAY OF SURGERY.      Call this number if you have problems the morning of surgery [phone]    REMEMBER: NO  SOLID FOODS , CANDY, GUM OR MINTS AFTER [place] .       Marland Kitchen CLEAR LIQUIDS UNTIL    0700am             DAY OF SURGERY.            CLEAR LIQUID DIET   Foods Allowed      WATER BLACK COFFEE ( SUGAR OK, NO MILK, CREAM OR CREAMER) REGULAR AND DECAF  TEA ( SUGAR OK NO MILK, CREAM, OR CREAMER) REGULAR AND DECAF  PLAIN JELLO ( NO RED)  FRUIT ICES ( NO RED, NO FRUIT PULP)  POPSICLES ( NO RED)  JUICE- APPLE, WHITE GRAPE AND WHITE CRANBERRY  SPORT DRINK LIKE GATORADE ( NO RED)  CLEAR BROTH ( VEGETABLE , CHICKEN OR BEEF)                                                                     BRUSH YOUR TEETH MORNING OF SURGERY AND RINSE YOUR MOUTH OUT, NO CHEWING GUM CANDY OR MINTS.     Take these medicines the morning of surgery with A SIP OF WATER:  imdur, metoprolol, protonix    DO NOT TAKE ANY DIABETIC MEDICATIONS DAY OF YOUR SURGERY                               You may not have any metal on your body including hair pins and              piercings  Do not wear jewelry, make-up, lotions, powders or perfumes, deodorant             Do not wear nail polish on your fingernails.              IF YOU ARE A FEMALE AND WANT TO SHAVE UNDER ARMS OR LEGS PRIOR TO SURGERY YOU MUST DO SO AT LEAST 48 HOURS PRIOR TO SURGERY.              Men may shave face  and neck.   Do not bring valuables to the hospital. [place].  Contacts, dentures or bridgework may not be worn  into surgery.  Leave suitcase in the car. After surgery it may be brought to your room.     Patients discharged the day of surgery will not be allowed to drive home. IF YOU ARE HAVING SURGERY AND GOING HOME THE SAME DAY, YOU MUST HAVE AN ADULT TO DRIVE YOU HOME AND BE WITH YOU FOR 24 HOURS. YOU MAY GO HOME BY TAXI OR UBER OR ORTHERWISE, BUT AN ADULT MUST ACCOMPANY YOU HOME AND STAY WITH YOU FOR 24 HOURS.                Please read over the following fact sheets you were given: _____________________________________________________________________  [facility] - Preparing for Surgery Before surgery, you can play an important role.  Because skin is not sterile, your skin needs to be as free of germs as possible.  You can reduce the number of germs on your skin by washing with CHG (chlorahexidine gluconate) soap before surgery.  CHG is an antiseptic cleaner which kills germs and bonds with the skin to continue killing germs even after washing. Please DO NOT use if you have an allergy to CHG or antibacterial soaps.  If your skin becomes reddened/irritated stop using the CHG and inform your nurse when you arrive at Short Stay. Do not shave (including legs and underarms) for at least 48 hours prior to the first CHG shower.  You may shave your face/neck. Please follow these instructions carefully:  1.  Shower with CHG Soap the night before surgery and the  morning of Surgery.  2.  If you choose to wash your hair, wash your hair first as usual with your  normal  shampoo.  3.  After you shampoo, rinse your hair and body thoroughly to remove the  shampoo.                           4.  Use CHG as you would any other liquid soap.  You can apply chg directly  to the skin and wash                       Gently with a scrungie or clean washcloth.  5.   Apply the CHG Soap to your body ONLY FROM THE NECK DOWN.   Do not use on face/ open                           Wound or open sores. Avoid contact with eyes, ears mouth and genitals (private parts).                       Wash face,  Genitals (private parts) with your normal soap.             6.  Wash thoroughly, paying special attention to the area where your surgery  will be performed.  7.  Thoroughly rinse your body with warm water from the neck down.  8.  DO NOT shower/wash with your normal soap after using and rinsing off  the CHG Soap.                9.  Pat yourself dry with a clean towel.            10.  Wear clean pajamas.            11.  Place clean sheets on your bed the night  of your first shower and do not  sleep with pets. Day of Surgery : Do not apply any lotions/deodorants the morning of surgery.  Please wear clean clothes to the hospital/surgery center.  FAILURE TO FOLLOW THESE INSTRUCTIONS MAY RESULT IN THE CANCELLATION OF YOUR SURGERY PATIENT SIGNATURE_________________________________  NURSE SIGNATURE__________________________________  ________________________________________________________________________

## 2022-05-10 ENCOUNTER — Other Ambulatory Visit: Payer: Self-pay

## 2022-05-10 ENCOUNTER — Encounter (HOSPITAL_COMMUNITY)
Admission: RE | Admit: 2022-05-10 | Discharge: 2022-05-10 | Disposition: A | Discharge status: Home / Self Care | Payer: Medicare Other | Source: Ambulatory Visit | Attending: Surgery | Admitting: Surgery

## 2022-05-10 ENCOUNTER — Encounter (HOSPITAL_COMMUNITY): Payer: Self-pay

## 2022-05-10 ENCOUNTER — Ambulatory Visit: Payer: Self-pay | Admitting: Surgery

## 2022-05-10 VITALS — BP 122/73 | HR 58 | Temp 97.7°F | Resp 16 | Ht 66.0 in | Wt 147.7 lb

## 2022-05-10 DIAGNOSIS — I10 Essential (primary) hypertension: Secondary | ICD-10-CM | POA: Insufficient documentation

## 2022-05-10 DIAGNOSIS — Z01818 Encounter for other preprocedural examination: Secondary | ICD-10-CM

## 2022-05-10 DIAGNOSIS — I2510 Atherosclerotic heart disease of native coronary artery without angina pectoris: Secondary | ICD-10-CM | POA: Diagnosis not present

## 2022-05-10 DIAGNOSIS — Z01812 Encounter for preprocedural laboratory examination: Secondary | ICD-10-CM | POA: Diagnosis not present

## 2022-05-10 DIAGNOSIS — K635 Polyp of colon: Secondary | ICD-10-CM | POA: Insufficient documentation

## 2022-05-10 HISTORY — DX: Pneumonia, unspecified organism: J18.9

## 2022-05-10 HISTORY — DX: Neutropenia, unspecified: D70.9

## 2022-05-10 HISTORY — DX: Decreased white blood cell count, unspecified: D72.819

## 2022-05-10 LAB — CBC
HCT: 38.9 % (ref 36.0–46.0)
Hemoglobin: 12.9 g/dL (ref 12.0–15.0)
MCH: 35.7 pg — ABNORMAL HIGH (ref 26.0–34.0)
MCHC: 33.2 g/dL (ref 30.0–36.0)
MCV: 107.8 fL — ABNORMAL HIGH (ref 80.0–100.0)
Platelets: 172 10*3/uL (ref 150–400)
RBC: 3.61 MIL/uL — ABNORMAL LOW (ref 3.87–5.11)
RDW: 12.8 % (ref 11.5–15.5)
WBC: 2.5 10*3/uL — ABNORMAL LOW (ref 4.0–10.5)
nRBC: 0 % (ref 0.0–0.2)

## 2022-05-10 LAB — BASIC METABOLIC PANEL
Anion gap: 9 (ref 5–15)
BUN: 10 mg/dL (ref 8–23)
CO2: 26 mmol/L (ref 22–32)
Calcium: 9.6 mg/dL (ref 8.9–10.3)
Chloride: 106 mmol/L (ref 98–111)
Creatinine, Ser: 0.71 mg/dL (ref 0.44–1.00)
GFR, Estimated: >60 mL/min (ref >60)
Glucose, Bld: 68 mg/dL — ABNORMAL LOW (ref 70–99)
Potassium: 3.8 mmol/L (ref 3.5–5.1)
Sodium: 141 mmol/L (ref 135–145)

## 2022-05-10 NOTE — Progress Notes (Signed)
Here are the 3 Ensure preop drinks that Dr Dema Severin h as ordered for your surgery.  Follow these instructions.   Be on a clear liquid diet the day of your bowel prep.    Drink 2 Pre Surgery Ensure drinks at 1000pm the nite before surgery.   Drink the 3rd PreSurgery Ensure drink  by 0700am morning of surgery.    Thank You.   Any questionsi call [phone].   Gillian Shields  RN

## 2022-05-11 ENCOUNTER — Ambulatory Visit: Payer: Medicare Other | Admitting: Podiatry

## 2022-05-11 DIAGNOSIS — B351 Tinea unguium: Secondary | ICD-10-CM | POA: Diagnosis not present

## 2022-05-11 MED ORDER — CICLOPIROX 8 % EX SOLN: Freq: Every day | CUTANEOUS | 0 refills | Status: DC

## 2022-05-11 NOTE — Anesthesia Preprocedure Evaluation (Addendum)
Anesthesia Evaluation  Patient identified by MRN, date of birth, ID band Patient awake    Reviewed: Allergy & Precautions, NPO status , Patient's Chart, lab work & pertinent test results  Airway Mallampati: II  TM Distance: >3 FB Neck ROM: Full    Dental no notable dental hx. (+) Teeth Intact, Dental Advisory Given   Pulmonary    Pulmonary exam normal breath sounds clear to auscultation       Cardiovascular hypertension, + CAD  Normal cardiovascular exam Rhythm:Regular Rate:Normal     Neuro/Psych negative neurological ROS  negative psych ROS   GI/Hepatic Neg liver ROS, GERD  ,HX of colon polyp   Endo/Other  negative endocrine ROS  Renal/GU Lab Results      Component                Value               Date                      CREATININE               0.71                05/10/2022                K                        3.8                 05/10/2022                    Musculoskeletal negative musculoskeletal ROS (+)   Abdominal   Peds  Hematology Lab Results      Component                Value               Date                   HGB                      12.9                05/10/2022                HCT                      38.9                05/10/2022              PLT                      172                 05/10/2022              Anesthesia Other Findings All: Erythromycin, Vibramycin  Hx of R Breast CA w Mastectomy  Reproductive/Obstetrics                          Anesthesia Physical Anesthesia Plan  ASA: 3  Anesthesia Plan: General   Post-op Pain Management: Lidocaine infusion*, Ketamine IV* and Ofirmev IV (intra-op)*   Induction: Intravenous  PONV Risk Score and Plan: 3 and Treatment may vary due to  age or medical condition, Midazolam and Ondansetron  Airway Management Planned: Oral ETT  Additional Equipment: None  Intra-op Plan:   Post-operative Plan: Extubation  in OR  Informed Consent:     Dental advisory given  Plan Discussed with:   Anesthesia Plan Comments: (See APP note by Durel Salts, FNP )       Anesthesia Quick Evaluation

## 2022-05-11 NOTE — Progress Notes (Signed)
Anesthesia Chart Review:   Case: [id number] Date/Time: 05/18/22 0945   Procedure: LAPAROSCOPIC RIGHT HEMI COLECTOMY (Right)   Anesthesia type: General   Pre-op diagnosis: colon polyp   Location: [facility] ROOM 02 / [facility]   Surgeons: Ileana Roup, MD       DISCUSSION: Pt is 76 years old with hx CAD (nonobstructive by 2021 cath), HTN,   VS: BP 122/73   Pulse (!) 58   Temp 36.5 C (Oral)   Resp 16   Ht '5\' 6"'$  (1.676 m)   Wt 67 kg   SpO2 100%   BMI 23.84 kg/m   PROVIDERS: - PCP is Allwardt, Randa Evens, PA-C   LABS: Labs reviewed: Acceptable for surgery. (all labs ordered are listed, but only abnormal results are displayed)  Labs Reviewed  CBC - Abnormal; Notable for the following components:      Result Value   WBC 2.5 (*)    RBC 3.61 (*)    MCV 107.8 (*)    MCH 35.7 (*)    All other components within normal limits  BASIC METABOLIC PANEL - Abnormal; Notable for the following components:   Glucose, Bld 68 (*)    All other components within normal limits  TYPE AND SCREEN     IMAGES: CXR 01/20/22:  1. No acute chest findings, stable radiographic appearance of the chest. 2. Pectus excavatum.  EKG 10/18/21: NSR and normal axis, intervals and durations.  Cannot rule out anterior MI, age-indeterminate   CV: Cardiac cath 09/03/20 (care everywhere): - LM: patent  - LAD: mild calcification. MIld irregularities proximal LAD  (FFR 0.95) First diagonal branch has 50% stenosis at bifurcation into tertiary branch. Small vessel.  - LCX: Patent with patent OMs  - RCA: Patent tortuous   Past Medical History:  Diagnosis Date   Adenomatous colon polyp 2018   Allergy    SEASONAL   Breast cancer in female [facility]) 02/2013   Right-sided breast cancer-s/p right mastectomy.  No chemotherapy or radiation.   Essential hypertension    Family history of breast cancer    Family history of colon cancer    GERD (gastroesophageal reflux disease)    Hyperlipidemia LDL goal <100     Documented coronary calcification with mild CAD.   Leukopenia    Neutropenia (HCC)    Pneumonia    Seasonal allergies     Past Surgical History:  Procedure Laterality Date   COLONOSCOPY     LEFT HEART CATH AND CORONARY ANGIOGRAPHY  07/14/2020   [place], [place], [place] : no flow-limiting lesions.  Proximal LAD calcified lesion: FFR 0.95.  D1 to Strader bifurcation 50% stenosis.  Normal RCA and LCx.   MASTECTOMY Right 02/04/2013   NM MYOVIEW LTD  07/2020   Read as is "abnormal"-referred for cath that was normal.   PLACEMENT OF BREAST IMPLANTS Right 08/2013   POLYPECTOMY     TRANSTHORACIC ECHOCARDIOGRAM  2021   ([place], [place]) normal EF with no RWMA.   TUBAL LIGATION  1973   Reversed in 1981   Tubal ligation reversal  1981    MEDICATIONS:  Ascorbic Acid (VITAMIN C) 500 MG CAPS   aspirin 81 MG EC tablet   Cholecalciferol (VITAMIN D3) 50 MCG (2000 UT) TABS   ezetimibe (ZETIA) 10 MG tablet   fexofenadine (ALLEGRA) 180 MG tablet   fluticasone (FLONASE) 50 MCG/ACT nasal spray   isosorbide mononitrate (IMDUR) 30 MG 24 hr tablet   losartan (COZAAR) 50 MG tablet   metoprolol  tartrate (LOPRESSOR) 25 MG tablet   metroNIDAZOLE (FLAGYL) 500 MG tablet   Multiple Vitamins-Minerals (CENTRUM SILVER PO)   neomycin (MYCIFRADIN) 500 MG tablet   pantoprazole (PROTONIX) 20 MG tablet   Probiotic Product (PROBIOTIC PO)   rosuvastatin (CRESTOR) 10 MG tablet   vitamin B-12 (CYANOCOBALAMIN) 1000 MCG tablet   Wheat Dextrin (BENEFIBER DRINK MIX) PACK   No current facility-administered medications for this encounter.    If no changes, I anticipate pt can proceed with surgery as scheduled.   Willeen Cass, PhD, FNP-BC [facility] Short Stay Surgical Center/Anesthesiology Phone: [phone] 05/11/2022 1:53 PM

## 2022-05-12 NOTE — Progress Notes (Signed)
Subjective:  Patient ID: Caitlin Parks, female    DOB: 1946/09/01,  MRN: [id number]  Chief Complaint  Patient presents with   Nail Problem    Nail fungus     76 y.o. female presents with the above complaint.  Patient presents with complaint left hallux nail fungus.  Patient states that it is thickened dystrophic mycotic nail.  Patient states is nonpainful.  She wanted to discuss treatment options for this.  She has not tried anything for it.  She does not want to take an oral medication.   Review of Systems: Negative except as noted in the HPI. Denies N/V/F/Ch.  Past Medical History:  Diagnosis Date   Adenomatous colon polyp 2018   Allergy    SEASONAL   Breast cancer in female [facility]) 02/2013   Right-sided breast cancer-s/p right mastectomy.  No chemotherapy or radiation.   Essential hypertension    Family history of breast cancer    Family history of colon cancer    GERD (gastroesophageal reflux disease)    Hyperlipidemia LDL goal <100    Documented coronary calcification with mild CAD.   Leukopenia    Neutropenia (HCC)    Pneumonia    Seasonal allergies     Current Outpatient Medications:    ciclopirox (PENLAC) 8 % solution, Apply topically at bedtime. Apply over nail and surrounding skin. Apply daily over previous coat. After seven (7) days, may remove with alcohol and continue cycle., Disp: 6.6 mL, Rfl: 0   Ascorbic Acid (VITAMIN C) 500 MG CAPS, Take 500 mg by mouth in the morning., Disp: , Rfl:    aspirin 81 MG EC tablet, Take 1 tablet (81 mg total) by mouth daily., Disp: 30 tablet, Rfl: 11   Cholecalciferol (VITAMIN D3) 50 MCG (2000 UT) TABS, Take 2,000 Units by mouth in the morning., Disp: , Rfl:    ezetimibe (ZETIA) 10 MG tablet, Take 1 tablet (10 mg total) by mouth at bedtime., Disp: 90 tablet, Rfl: 3   fexofenadine (ALLEGRA) 180 MG tablet, Take 180 mg by mouth daily as needed (allergies.)., Disp: , Rfl:    fluticasone (FLONASE) 50 MCG/ACT nasal spray, Place 1-2  sprays into both nostrils daily as needed for allergies., Disp: , Rfl:    isosorbide mononitrate (IMDUR) 30 MG 24 hr tablet, Take 1 tablet (30 mg total) by mouth daily., Disp: 90 tablet, Rfl: 3   losartan (COZAAR) 50 MG tablet, Take 1 tablet (50 mg total) by mouth daily. (Patient taking differently: Take 50 mg by mouth every evening.), Disp: 90 tablet, Rfl: 3   metoprolol tartrate (LOPRESSOR) 25 MG tablet, Take 1 tablet (25 mg total) by mouth 2 (two) times daily., Disp: 180 tablet, Rfl: 3   metroNIDAZOLE (FLAGYL) 500 MG tablet, Take 500 mg by mouth 3 (three) times daily., Disp: , Rfl:    Multiple Vitamins-Minerals (CENTRUM SILVER PO), Take 1 tablet by mouth in the morning., Disp: , Rfl:    neomycin (MYCIFRADIN) 500 MG tablet, Take 1,000 mg by mouth 3 (three) times daily., Disp: , Rfl:    pantoprazole (PROTONIX) 20 MG tablet, Take 20 mg by mouth daily as needed for heartburn or indigestion., Disp: , Rfl:    Probiotic Product (PROBIOTIC PO), Take 1 capsule by mouth in the morning., Disp: , Rfl:    rosuvastatin (CRESTOR) 10 MG tablet, Take 1 tablet (10 mg total) by mouth daily. (Patient taking differently: Take 10 mg by mouth every evening.), Disp: 90 tablet, Rfl: 3   vitamin B-12 (CYANOCOBALAMIN)  1000 MCG tablet, Take 1,000 mcg by mouth in the morning., Disp: , Rfl:    Wheat Dextrin (BENEFIBER DRINK MIX) PACK, Take 1 packet by mouth in the morning and at bedtime., Disp: , Rfl:   Social History   Tobacco Use  Smoking Status Never   Passive exposure: Past  Smokeless Tobacco Never    Allergies  Allergen Reactions   E.E.S. [Erythromycin] Diarrhea   Vibramycin [Doxycycline] Rash and Other (See Comments)   Objective:  There were no vitals filed for this visit. There is no height or weight on file to calculate BMI. Constitutional Well developed. Well nourished.  Vascular Dorsalis pedis pulses palpable bilaterally. Posterior tibial pulses palpable bilaterally. Capillary refill normal to all  digits.  No cyanosis or clubbing noted. Pedal hair growth normal.  Neurologic Normal speech. Oriented to person, place, and time. Epicritic sensation to light touch grossly present bilaterally.  Dermatologic Nails thickened elongated dystrophic mycotic left hallux nail.  No pain on palpation. Skin within normal limits  Orthopedic: Normal joint ROM without pain or crepitus bilaterally. No visible deformities. No bony tenderness.   Radiographs: None Assessment:   1. Onychomycosis due to dermatophyte   2. Nail fungus    Plan:  Patient was evaluated and treated and all questions answered.  Left hallux onychomycosis -Educated the patient on the etiology of onychomycosis and various treatment options associated with improving the fungal load.  I explained to the patient that there is 3 treatment options available to treat the onychomycosis including topical, p.o., laser treatment.  Patient elected to undergo topical medication with Penlac.  I have asked her to apply twice a day for 6 to 8 months for improvement.  She states understanding   No follow-ups on file.

## 2022-05-18 ENCOUNTER — Encounter (HOSPITAL_COMMUNITY): Payer: Self-pay | Admitting: Surgery

## 2022-05-18 ENCOUNTER — Other Ambulatory Visit: Payer: Self-pay

## 2022-05-18 ENCOUNTER — Ambulatory Visit: Payer: Medicare Other

## 2022-05-18 ENCOUNTER — Encounter (HOSPITAL_COMMUNITY)
Admission: RE | Disposition: A | Discharge status: Home / Self Care | Payer: Self-pay | Source: Home / Self Care | Attending: Surgery

## 2022-05-18 ENCOUNTER — Inpatient Hospital Stay (HOSPITAL_COMMUNITY): Payer: Medicare Other | Admitting: Certified Registered"

## 2022-05-18 ENCOUNTER — Other Ambulatory Visit (HOSPITAL_COMMUNITY): Payer: Self-pay

## 2022-05-18 ENCOUNTER — Inpatient Hospital Stay (HOSPITAL_COMMUNITY)
Admission: RE | Admit: 2022-05-18 | Discharge: 2022-05-25 | DRG: 330 | Disposition: A | Discharge status: Home / Self Care | Payer: Medicare Other | Attending: Surgery | Admitting: Surgery

## 2022-05-18 ENCOUNTER — Inpatient Hospital Stay (HOSPITAL_COMMUNITY): Payer: Medicare Other | Admitting: Emergency Medicine

## 2022-05-18 DIAGNOSIS — D126 Benign neoplasm of colon, unspecified: Secondary | ICD-10-CM | POA: Diagnosis not present

## 2022-05-18 DIAGNOSIS — K6389 Other specified diseases of intestine: Secondary | ICD-10-CM | POA: Diagnosis not present

## 2022-05-18 DIAGNOSIS — Z01818 Encounter for other preprocedural examination: Secondary | ICD-10-CM

## 2022-05-18 DIAGNOSIS — Z803 Family history of malignant neoplasm of breast: Secondary | ICD-10-CM | POA: Diagnosis not present

## 2022-05-18 DIAGNOSIS — I10 Essential (primary) hypertension: Secondary | ICD-10-CM

## 2022-05-18 DIAGNOSIS — K5939 Other megacolon: Secondary | ICD-10-CM | POA: Diagnosis not present

## 2022-05-18 DIAGNOSIS — Z4682 Encounter for fitting and adjustment of non-vascular catheter: Secondary | ICD-10-CM | POA: Diagnosis not present

## 2022-05-18 DIAGNOSIS — K9189 Other postprocedural complications and disorders of digestive system: Secondary | ICD-10-CM | POA: Diagnosis not present

## 2022-05-18 DIAGNOSIS — K635 Polyp of colon: Secondary | ICD-10-CM

## 2022-05-18 DIAGNOSIS — Z853 Personal history of malignant neoplasm of breast: Secondary | ICD-10-CM | POA: Diagnosis not present

## 2022-05-18 DIAGNOSIS — I2510 Atherosclerotic heart disease of native coronary artery without angina pectoris: Secondary | ICD-10-CM | POA: Diagnosis not present

## 2022-05-18 DIAGNOSIS — R0902 Hypoxemia: Secondary | ICD-10-CM | POA: Diagnosis not present

## 2022-05-18 DIAGNOSIS — K567 Ileus, unspecified: Secondary | ICD-10-CM | POA: Diagnosis not present

## 2022-05-18 DIAGNOSIS — Z888 Allergy status to other drugs, medicaments and biological substances status: Secondary | ICD-10-CM | POA: Diagnosis not present

## 2022-05-18 DIAGNOSIS — Z9011 Acquired absence of right breast and nipple: Secondary | ICD-10-CM | POA: Diagnosis not present

## 2022-05-18 DIAGNOSIS — Z881 Allergy status to other antibiotic agents status: Secondary | ICD-10-CM

## 2022-05-18 DIAGNOSIS — E785 Hyperlipidemia, unspecified: Secondary | ICD-10-CM | POA: Diagnosis present

## 2022-05-18 DIAGNOSIS — Z9049 Acquired absence of other specified parts of digestive tract: Principal | ICD-10-CM

## 2022-05-18 DIAGNOSIS — K219 Gastro-esophageal reflux disease without esophagitis: Secondary | ICD-10-CM | POA: Diagnosis not present

## 2022-05-18 DIAGNOSIS — K921 Melena: Principal | ICD-10-CM | POA: Diagnosis present

## 2022-05-18 DIAGNOSIS — Z87891 Personal history of nicotine dependence: Secondary | ICD-10-CM

## 2022-05-18 DIAGNOSIS — D122 Benign neoplasm of ascending colon: Secondary | ICD-10-CM | POA: Diagnosis not present

## 2022-05-18 DIAGNOSIS — Z86010 Personal history of colonic polyps: Secondary | ICD-10-CM | POA: Diagnosis not present

## 2022-05-18 HISTORY — PX: LAPAROSCOPIC RIGHT HEMI COLECTOMY: SHX5926

## 2022-05-18 HISTORY — PX: POLYPECTOMY: SHX149

## 2022-05-18 LAB — ABO/RH: ABO/RH(D): O NEG

## 2022-05-18 LAB — TYPE AND SCREEN
ABO/RH(D): O NEG
Antibody Screen: NEGATIVE

## 2022-05-18 SURGERY — LAPAROSCOPIC RIGHT HEMI COLECTOMY
Anesthesia: General | Laterality: Right

## 2022-05-18 MED ORDER — ISOSORBIDE MONONITRATE ER 30 MG PO TB24
30.0000 mg | ORAL_TABLET | Freq: Every day | ORAL | Status: DC
  Administered 2022-05-19 – 2022-05-25 (×7): 30 mg via ORAL
  Filled 2022-05-18 (×7): qty 1

## 2022-05-18 MED ORDER — HEPARIN SODIUM (PORCINE) 5000 UNIT/ML IJ SOLN
5000.0000 [IU] | Freq: Three times a day (TID) | INTRAMUSCULAR | Status: DC
  Administered 2022-05-18 – 2022-05-19 (×2): 5000 [IU] via SUBCUTANEOUS
  Filled 2022-05-18 (×2): qty 1

## 2022-05-18 MED ORDER — DIPHENHYDRAMINE HCL 12.5 MG/5ML PO ELIX: 12.5000 mg | ORAL_SOLUTION | Freq: Four times a day (QID) | ORAL | Status: DC | PRN

## 2022-05-18 MED ORDER — ONDANSETRON HCL 4 MG/2ML IJ SOLN
INTRAMUSCULAR | Status: AC
  Filled 2022-05-18: qty 2

## 2022-05-18 MED ORDER — HEPARIN SODIUM (PORCINE) 5000 UNIT/ML IJ SOLN
5000.0000 [IU] | Freq: Once | INTRAMUSCULAR | Status: AC
  Administered 2022-05-18: 5000 [IU] via SUBCUTANEOUS
  Filled 2022-05-18: qty 1

## 2022-05-18 MED ORDER — OXYCODONE HCL 5 MG/5ML PO SOLN: 5.0000 mg | Freq: Once | ORAL | Status: DC | PRN

## 2022-05-18 MED ORDER — ASPIRIN 81 MG PO TBEC
81.0000 mg | DELAYED_RELEASE_TABLET | Freq: Every day | ORAL | Status: DC
  Administered 2022-05-19: 81 mg via ORAL
  Filled 2022-05-18: qty 1

## 2022-05-18 MED ORDER — ROSUVASTATIN CALCIUM 10 MG PO TABS
10.0000 mg | ORAL_TABLET | Freq: Every evening | ORAL | Status: DC
  Administered 2022-05-18 – 2022-05-24 (×6): 10 mg via ORAL
  Filled 2022-05-18 (×6): qty 1

## 2022-05-18 MED ORDER — CHLORHEXIDINE GLUCONATE CLOTH 2 % EX PADS: 6.0000 | MEDICATED_PAD | Freq: Once | CUTANEOUS | Status: DC

## 2022-05-18 MED ORDER — TRAMADOL HCL 50 MG PO TABS
50.0000 mg | ORAL_TABLET | Freq: Four times a day (QID) | ORAL | 0 refills | Status: AC | PRN
  Filled 2022-05-18: qty 20, 5d supply, fill #0

## 2022-05-18 MED ORDER — ROCURONIUM BROMIDE 10 MG/ML (PF) SYRINGE
PREFILLED_SYRINGE | INTRAVENOUS | Status: DC | PRN
  Administered 2022-05-18: 60 mg via INTRAVENOUS

## 2022-05-18 MED ORDER — BUPIVACAINE LIPOSOME 1.3 % IJ SUSP
INTRAMUSCULAR | Status: DC | PRN
  Administered 2022-05-18: 20 mL

## 2022-05-18 MED ORDER — FENTANYL CITRATE (PF) 100 MCG/2ML IJ SOLN
INTRAMUSCULAR | Status: AC
  Filled 2022-05-18: qty 2

## 2022-05-18 MED ORDER — PANTOPRAZOLE SODIUM 40 MG PO TBEC: 40.0000 mg | DELAYED_RELEASE_TABLET | Freq: Every day | ORAL | Status: DC | PRN

## 2022-05-18 MED ORDER — ACETAMINOPHEN 500 MG PO TABS
1000.0000 mg | ORAL_TABLET | ORAL | Status: AC
  Administered 2022-05-18: 1000 mg via ORAL
  Filled 2022-05-18: qty 2

## 2022-05-18 MED ORDER — TRAMADOL HCL 50 MG PO TABS
50.0000 mg | ORAL_TABLET | Freq: Four times a day (QID) | ORAL | Status: DC | PRN
  Administered 2022-05-18 – 2022-05-24 (×9): 50 mg via ORAL
  Filled 2022-05-18 (×8): qty 1

## 2022-05-18 MED ORDER — ACETAMINOPHEN 500 MG PO TABS
1000.0000 mg | ORAL_TABLET | Freq: Four times a day (QID) | ORAL | Status: DC
  Administered 2022-05-18 – 2022-05-25 (×15): 1000 mg via ORAL
  Filled 2022-05-18 (×18): qty 2

## 2022-05-18 MED ORDER — EPHEDRINE 5 MG/ML INJ
INTRAVENOUS | Status: AC
  Filled 2022-05-18: qty 5

## 2022-05-18 MED ORDER — DEXAMETHASONE SODIUM PHOSPHATE 10 MG/ML IJ SOLN
INTRAMUSCULAR | Status: DC | PRN
  Administered 2022-05-18: 4 mg via INTRAVENOUS

## 2022-05-18 MED ORDER — VITAMIN D3 25 MCG (1000 UNIT) PO TABS
2000.0000 [IU] | ORAL_TABLET | Freq: Every morning | ORAL | Status: DC
  Administered 2022-05-19 – 2022-05-25 (×3): 2000 [IU] via ORAL
  Filled 2022-05-18 (×5): qty 2

## 2022-05-18 MED ORDER — ALVIMOPAN 12 MG PO CAPS
12.0000 mg | ORAL_CAPSULE | ORAL | Status: AC
  Administered 2022-05-18: 12 mg via ORAL
  Filled 2022-05-18: qty 1

## 2022-05-18 MED ORDER — KETAMINE HCL 10 MG/ML IJ SOLN
INTRAMUSCULAR | Status: DC | PRN
  Administered 2022-05-18: 20 mg via INTRAVENOUS

## 2022-05-18 MED ORDER — ASCORBIC ACID 500 MG PO TABS
500.0000 mg | ORAL_TABLET | Freq: Every morning | ORAL | Status: DC
Start: 2022-05-19 — End: 2022-05-25
  Administered 2022-05-19 – 2022-05-25 (×3): 500 mg via ORAL
  Filled 2022-05-18 (×5): qty 1

## 2022-05-18 MED ORDER — LOSARTAN POTASSIUM 50 MG PO TABS
50.0000 mg | ORAL_TABLET | Freq: Every evening | ORAL | Status: DC
  Administered 2022-05-18 – 2022-05-24 (×6): 50 mg via ORAL
  Filled 2022-05-18 (×6): qty 1

## 2022-05-18 MED ORDER — DIPHENHYDRAMINE HCL 50 MG/ML IJ SOLN
12.5000 mg | Freq: Four times a day (QID) | INTRAMUSCULAR | Status: DC | PRN
  Administered 2022-05-22: 12.5 mg via INTRAVENOUS
  Filled 2022-05-18: qty 1

## 2022-05-18 MED ORDER — SIMETHICONE 80 MG PO CHEW
40.0000 mg | CHEWABLE_TABLET | Freq: Four times a day (QID) | ORAL | Status: DC | PRN
  Administered 2022-05-19 – 2022-05-23 (×2): 40 mg via ORAL
  Filled 2022-05-18 (×2): qty 1

## 2022-05-18 MED ORDER — HYDROMORPHONE HCL 1 MG/ML IJ SOLN
0.2500 mg | INTRAMUSCULAR | Status: DC | PRN
  Administered 2022-05-18 (×3): 0.5 mg via INTRAVENOUS

## 2022-05-18 MED ORDER — LACTATED RINGERS IR SOLN
Status: DC | PRN
  Administered 2022-05-18: 1000 mL

## 2022-05-18 MED ORDER — ONDANSETRON HCL 4 MG/2ML IJ SOLN
INTRAMUSCULAR | Status: DC | PRN
  Administered 2022-05-18: 4 mg via INTRAVENOUS

## 2022-05-18 MED ORDER — LACTATED RINGERS IV SOLN: INTRAVENOUS | Status: DC

## 2022-05-18 MED ORDER — HYDROMORPHONE HCL 1 MG/ML IJ SOLN
INTRAMUSCULAR | Status: AC
  Filled 2022-05-18: qty 1

## 2022-05-18 MED ORDER — PROPOFOL 10 MG/ML IV BOLUS
INTRAVENOUS | Status: DC | PRN
  Administered 2022-05-18: 120 mg via INTRAVENOUS

## 2022-05-18 MED ORDER — PROPOFOL 10 MG/ML IV BOLUS
INTRAVENOUS | Status: AC
  Filled 2022-05-18: qty 20

## 2022-05-18 MED ORDER — SODIUM CHLORIDE 0.9 % IV SOLN
2.0000 g | INTRAVENOUS | Status: AC
  Administered 2022-05-18: 2 g via INTRAVENOUS
  Filled 2022-05-18: qty 2

## 2022-05-18 MED ORDER — BISACODYL 5 MG PO TBEC: 20.0000 mg | DELAYED_RELEASE_TABLET | Freq: Once | ORAL | Status: DC

## 2022-05-18 MED ORDER — VITAMIN B-12 1000 MCG PO TABS
1000.0000 ug | ORAL_TABLET | Freq: Every morning | ORAL | Status: DC
  Administered 2022-05-19 – 2022-05-25 (×3): 1000 ug via ORAL
  Filled 2022-05-18 (×4): qty 1

## 2022-05-18 MED ORDER — OXYCODONE HCL 5 MG PO TABS: 5.0000 mg | ORAL_TABLET | Freq: Once | ORAL | Status: DC | PRN

## 2022-05-18 MED ORDER — ALVIMOPAN 12 MG PO CAPS
12.0000 mg | ORAL_CAPSULE | Freq: Two times a day (BID) | ORAL | Status: DC
  Administered 2022-05-19: 12 mg via ORAL
  Filled 2022-05-18: qty 1

## 2022-05-18 MED ORDER — HYDROMORPHONE HCL 1 MG/ML IJ SOLN
0.5000 mg | INTRAMUSCULAR | Status: DC | PRN
  Administered 2022-05-18 – 2022-05-24 (×10): 0.5 mg via INTRAVENOUS
  Filled 2022-05-18 (×11): qty 0.5

## 2022-05-18 MED ORDER — ENSURE PRE-SURGERY PO LIQD: 592.0000 mL | Freq: Once | ORAL | Status: DC

## 2022-05-18 MED ORDER — BUPIVACAINE LIPOSOME 1.3 % IJ SUSP: 20.0000 mL | Freq: Once | INTRAMUSCULAR | Status: DC

## 2022-05-18 MED ORDER — DEXAMETHASONE SODIUM PHOSPHATE 10 MG/ML IJ SOLN
INTRAMUSCULAR | Status: AC
  Filled 2022-05-18: qty 1

## 2022-05-18 MED ORDER — PROPOFOL 10 MG/ML IV BOLUS
INTRAVENOUS | Status: AC
Start: 2022-05-18 — End: ?
  Filled 2022-05-18: qty 20

## 2022-05-18 MED ORDER — ENSURE SURGERY PO LIQD
237.0000 mL | Freq: Two times a day (BID) | ORAL | Status: DC
  Administered 2022-05-23: 237 mL via ORAL

## 2022-05-18 MED ORDER — EPHEDRINE SULFATE-NACL 50-0.9 MG/10ML-% IV SOSY
PREFILLED_SYRINGE | INTRAVENOUS | Status: DC | PRN
  Administered 2022-05-18: 10 mg via INTRAVENOUS

## 2022-05-18 MED ORDER — LORATADINE 10 MG PO TABS
10.0000 mg | ORAL_TABLET | Freq: Every day | ORAL | Status: DC
  Administered 2022-05-18 – 2022-05-25 (×5): 10 mg via ORAL
  Filled 2022-05-18 (×7): qty 1

## 2022-05-18 MED ORDER — ORAL CARE MOUTH RINSE: 15.0000 mL | Freq: Once | OROMUCOSAL | Status: AC

## 2022-05-18 MED ORDER — SUGAMMADEX SODIUM 200 MG/2ML IV SOLN
INTRAVENOUS | Status: DC | PRN
  Administered 2022-05-18: 140 mg via INTRAVENOUS

## 2022-05-18 MED ORDER — SODIUM CHLORIDE 0.9 % IR SOLN
Status: DC | PRN
  Administered 2022-05-18: 2000 mL

## 2022-05-18 MED ORDER — ONDANSETRON HCL 4 MG PO TABS
4.0000 mg | ORAL_TABLET | Freq: Four times a day (QID) | ORAL | Status: DC | PRN
  Administered 2022-05-20: 4 mg via ORAL
  Filled 2022-05-18: qty 1

## 2022-05-18 MED ORDER — BUPIVACAINE LIPOSOME 1.3 % IJ SUSP
INTRAMUSCULAR | Status: AC
  Filled 2022-05-18: qty 20

## 2022-05-18 MED ORDER — ONDANSETRON HCL 4 MG/2ML IJ SOLN: 4.0000 mg | Freq: Once | INTRAMUSCULAR | Status: DC | PRN

## 2022-05-18 MED ORDER — ACETAMINOPHEN 10 MG/ML IV SOLN
1000.0000 mg | Freq: Once | INTRAVENOUS | Status: DC | PRN
Start: 2022-05-18 — End: 2022-05-18

## 2022-05-18 MED ORDER — NEOMYCIN SULFATE 500 MG PO TABS: 1000.0000 mg | ORAL_TABLET | ORAL | Status: DC

## 2022-05-18 MED ORDER — ROCURONIUM BROMIDE 10 MG/ML (PF) SYRINGE
PREFILLED_SYRINGE | INTRAVENOUS | Status: AC
  Filled 2022-05-18: qty 10

## 2022-05-18 MED ORDER — HYDRALAZINE HCL 20 MG/ML IJ SOLN: 10.0000 mg | INTRAMUSCULAR | Status: DC | PRN

## 2022-05-18 MED ORDER — CHLORHEXIDINE GLUCONATE 0.12 % MT SOLN
15.0000 mL | Freq: Once | OROMUCOSAL | Status: AC
  Administered 2022-05-18: 15 mL via OROMUCOSAL

## 2022-05-18 MED ORDER — AMISULPRIDE (ANTIEMETIC) 5 MG/2ML IV SOLN
10.0000 mg | Freq: Once | INTRAVENOUS | Status: DC | PRN
Start: 2022-05-18 — End: 2022-05-18

## 2022-05-18 MED ORDER — METOPROLOL TARTRATE 25 MG PO TABS
25.0000 mg | ORAL_TABLET | Freq: Two times a day (BID) | ORAL | Status: DC
  Administered 2022-05-19 – 2022-05-21 (×4): 25 mg via ORAL
  Filled 2022-05-18 (×4): qty 1

## 2022-05-18 MED ORDER — LIDOCAINE 2% (20 MG/ML) 5 ML SYRINGE
INTRAMUSCULAR | Status: DC | PRN
  Administered 2022-05-18: 40 mg via INTRAVENOUS
  Administered 2022-05-18: 20 mg via INTRAVENOUS

## 2022-05-18 MED ORDER — METRONIDAZOLE 500 MG PO TABS: 1000.0000 mg | ORAL_TABLET | ORAL | Status: DC

## 2022-05-18 MED ORDER — ALUM & MAG HYDROXIDE-SIMETH 200-200-20 MG/5ML PO SUSP
30.0000 mL | Freq: Four times a day (QID) | ORAL | Status: DC | PRN
  Administered 2022-05-23 – 2022-05-24 (×2): 30 mL via ORAL
  Filled 2022-05-18 (×2): qty 30

## 2022-05-18 MED ORDER — ONDANSETRON HCL 4 MG/2ML IJ SOLN
4.0000 mg | Freq: Four times a day (QID) | INTRAMUSCULAR | Status: DC | PRN
  Administered 2022-05-19 – 2022-05-20 (×3): 4 mg via INTRAVENOUS
  Filled 2022-05-18 (×3): qty 2

## 2022-05-18 MED ORDER — BUPIVACAINE-EPINEPHRINE (PF) 0.25% -1:200000 IJ SOLN
INTRAMUSCULAR | Status: AC
  Filled 2022-05-18: qty 30

## 2022-05-18 MED ORDER — ENSURE PRE-SURGERY PO LIQD: 296.0000 mL | Freq: Once | ORAL | Status: DC

## 2022-05-18 MED ORDER — POLYETHYLENE GLYCOL 3350 17 GM/SCOOP PO POWD: 1.0000 | Freq: Once | ORAL | Status: DC

## 2022-05-18 MED ORDER — BUPIVACAINE-EPINEPHRINE 0.25% -1:200000 IJ SOLN
INTRAMUSCULAR | Status: DC | PRN
  Administered 2022-05-18: 30 mL

## 2022-05-18 MED ORDER — KETAMINE HCL 50 MG/5ML IJ SOSY
PREFILLED_SYRINGE | INTRAMUSCULAR | Status: AC
  Filled 2022-05-18: qty 5

## 2022-05-18 MED ORDER — FENTANYL CITRATE (PF) 250 MCG/5ML IJ SOLN
INTRAMUSCULAR | Status: DC | PRN
  Administered 2022-05-18 (×2): 50 ug via INTRAVENOUS

## 2022-05-18 MED ORDER — IBUPROFEN 400 MG PO TABS
600.0000 mg | ORAL_TABLET | Freq: Four times a day (QID) | ORAL | Status: DC | PRN
  Administered 2022-05-18 – 2022-05-19 (×2): 600 mg via ORAL
  Filled 2022-05-18 (×2): qty 1

## 2022-05-18 MED ORDER — LIDOCAINE 20MG/ML (2%) 15 ML SYRINGE OPTIME
INTRAMUSCULAR | Status: DC | PRN
  Administered 2022-05-18: 1.5 mg/kg/h via INTRAVENOUS

## 2022-05-18 SURGICAL SUPPLY — 64 items
APPLIER CLIP ROT 10 11.4 M/L (STAPLE)
BAG COUNTER SPONGE SURGICOUNT (BAG) IMPLANT
BLADE CLIPPER SURG (BLADE) IMPLANT
CABLE HIGH FREQUENCY MONO STRZ (ELECTRODE) ×2 IMPLANT
CELLS DAT CNTRL 66122 CELL SVR (MISCELLANEOUS) IMPLANT
CHLORAPREP W/TINT 26 (MISCELLANEOUS) ×2 IMPLANT
CLIP APPLIE ROT 10 11.4 M/L (STAPLE) IMPLANT
DERMABOND ADVANCED (GAUZE/BANDAGES/DRESSINGS) ×1
DERMABOND ADVANCED .7 DNX12 (GAUZE/BANDAGES/DRESSINGS) IMPLANT
DISSECTOR BLUNT TIP ENDO 5MM (MISCELLANEOUS) IMPLANT
ELECT REM PT RETURN 15FT ADLT (MISCELLANEOUS) ×2 IMPLANT
GAUZE SPONGE 4X4 12PLY STRL (GAUZE/BANDAGES/DRESSINGS) ×2 IMPLANT
GLOVE BIO SURGEON STRL SZ7.5 (GLOVE) ×4 IMPLANT
GLOVE ECLIPSE 8.0 STRL XLNG CF (GLOVE) ×4 IMPLANT
GOWN STRL REUS W/ TWL XL LVL3 (GOWN DISPOSABLE) ×4 IMPLANT
GOWN STRL REUS W/TWL XL LVL3 (GOWN DISPOSABLE) ×4
IRRIG SUCT STRYKERFLOW 2 WTIP (MISCELLANEOUS) ×2
IRRIGATION SUCT STRKRFLW 2 WTP (MISCELLANEOUS) IMPLANT
KIT TURNOVER KIT A (KITS) IMPLANT
LIGASURE IMPACT 36 18CM CVD LR (INSTRUMENTS) IMPLANT
NS IRRIG 1000ML POUR BTL (IV SOLUTION) ×3 IMPLANT
PACK COLON (CUSTOM PROCEDURE TRAY) ×2 IMPLANT
PAD POSITIONING PINK XL (MISCELLANEOUS) ×1 IMPLANT
PENCIL SMOKE EVACUATOR (MISCELLANEOUS) IMPLANT
PROTECTOR NERVE ULNAR (MISCELLANEOUS) IMPLANT
RELOAD PROXIMATE 75MM BLUE (ENDOMECHANICALS) ×4 IMPLANT
RELOAD STAPLE 75 3.8 BLU REG (ENDOMECHANICALS) IMPLANT
RETRACTOR WND ALEXIS 18 MED (MISCELLANEOUS) IMPLANT
RTRCTR WOUND ALEXIS 18CM MED (MISCELLANEOUS)
SCISSORS LAP 5X35 DISP (ENDOMECHANICALS) ×2 IMPLANT
SEALER TISSUE G2 STRG ARTC 35C (ENDOMECHANICALS) ×1 IMPLANT
SET TUBE SMOKE EVAC HIGH FLOW (TUBING) ×2 IMPLANT
SHEARS HARMONIC ACE PLUS 36CM (ENDOMECHANICALS) IMPLANT
SLEEVE ADV FIXATION 5X100MM (TROCAR) ×6 IMPLANT
SPIKE FLUID TRANSFER (MISCELLANEOUS) ×2 IMPLANT
STAPLER 90 3.5 STAND SLIM (STAPLE) ×2
STAPLER 90 3.5 STD SLIM (STAPLE) IMPLANT
STAPLER GUN LINEAR PROX 60 (STAPLE) IMPLANT
STAPLER PROXIMATE 75MM BLUE (STAPLE) ×1 IMPLANT
STAPLER VISISTAT 35W (STAPLE) ×2 IMPLANT
SUT MNCRL AB 4-0 PS2 18 (SUTURE) ×2 IMPLANT
SUT PDS AB 1 CT1 27 (SUTURE) ×4 IMPLANT
SUT PROLENE 2 0 CT2 30 (SUTURE) IMPLANT
SUT PROLENE 2 0 KS (SUTURE) IMPLANT
SUT SILK 2 0 (SUTURE) ×1
SUT SILK 2 0 SH CR/8 (SUTURE) ×2 IMPLANT
SUT SILK 2-0 18XBRD TIE 12 (SUTURE) ×1 IMPLANT
SUT SILK 3 0 (SUTURE) ×1
SUT SILK 3 0 SH CR/8 (SUTURE) ×2 IMPLANT
SUT SILK 3-0 18XBRD TIE 12 (SUTURE) ×1 IMPLANT
SYS LAPSCP GELPORT 120MM (MISCELLANEOUS)
SYS WOUND ALEXIS 18CM MED (MISCELLANEOUS) ×2
SYSTEM LAPSCP GELPORT 120MM (MISCELLANEOUS) IMPLANT
SYSTEM WOUND ALEXIS 18CM MED (MISCELLANEOUS) ×1 IMPLANT
TAPE CLOTH 4X10 WHT NS (GAUZE/BANDAGES/DRESSINGS) IMPLANT
TOWEL OR 17X26 10 PK STRL BLUE (TOWEL DISPOSABLE) ×2 IMPLANT
TRAY FOLEY MTR SLVR 14FR STAT (SET/KITS/TRAYS/PACK) ×2 IMPLANT
TRAY FOLEY MTR SLVR 16FR STAT (SET/KITS/TRAYS/PACK) IMPLANT
TROCAR 11X100 Z THREAD (TROCAR) IMPLANT
TROCAR ADV FIXATION 12X100MM (TROCAR) ×2 IMPLANT
TROCAR ADV FIXATION 5X100MM (TROCAR) ×2 IMPLANT
TROCAR BALLN 12MMX100 BLUNT (TROCAR) ×2 IMPLANT
TUBING CONNECTING 10 (TUBING) ×4 IMPLANT
YANKAUER SUCT BULB TIP NO VENT (SUCTIONS) ×2 IMPLANT

## 2022-05-18 NOTE — Anesthesia Procedure Notes (Signed)
Procedure Name: Intubation Date/Time: 05/18/2022 9:14 AM  Performed by: Eben Burow, CRNAPre-anesthesia Checklist: Patient identified, Emergency Drugs available, Suction available, Patient being monitored and Timeout performed Patient Re-evaluated:Patient Re-evaluated prior to induction Oxygen Delivery Method: Circle system utilized Preoxygenation: Pre-oxygenation with 100% oxygen Induction Type: IV induction Ventilation: Mask ventilation without difficulty Laryngoscope Size: Mac and 4 Grade View: Grade I Tube type: Oral Tube size: 7.0 mm Number of attempts: 1 Airway Equipment and Method: Stylet Placement Confirmation: ETT inserted through vocal cords under direct vision, positive ETCO2 and breath sounds checked- equal and bilateral Secured at: 22 cm Tube secured with: Tape Dental Injury: Teeth and Oropharynx as per pre-operative assessment

## 2022-05-18 NOTE — H&P (Addendum)
CC: Here today for surgery  HPI: Caitlin Parks is an 76 y.o. female with history of GERD, HLD, personal history of breast cancer whom is seen in the office today as a referral by Dr. Tarri Glenn for evaluation of endoscopically unresectable polypoid lesion involving the ileocecal valve found on colonoscopy done for surveillance 03/01/2022.  Colonoscopy 03/01/2022 (for hx of colon polyps, last reportedly 5 yrs ago) demonstrated: 1. 4 sessile polyps in the transverse colon and ascending colon. These are 1 to 2 mm in size and resected. 2. 2 sessile polyps in the cecum 2 to 3 mm in size. 3. A 20 mm polypoid lesion at the ileocecal valve that was carpet like and appears to involve the valve. Biopsied.  Path: Tubular adenomas in the ascending colon/cecum. 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 No invasive carcinoma identified. Transverse colon polyps were tubular adenomas. No dysplasia  Denies any complaints today; no abdominal pain, nausea, vomiting or diarrhea. No blood in her stool. Appetite good. Weight stable.  Denies any changes in health/health hx since we met in the office. Tolerated prep reasonably so with some nausea/vomiting but with clear result from below. No other complaints voiced.   PMH: GERD, HLD, breast cancer  PSH: BTL; tubal reversal via Pfannenstiel  FHx: Denies any known family history of colorectal, breast, endometrial or ovarian cancer  Social Hx: Denies use of tobacco/EtOH/illicit drug. She is happily retired. 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  Past Medical History:  Diagnosis Date   Adenomatous colon polyp 2018   Allergy    SEASONAL   Breast cancer in female [facility]) 02/2013   Right-sided breast cancer-s/p right mastectomy.  No chemotherapy or radiation.   Essential hypertension    Family history of breast cancer    Family history of colon cancer    GERD  (gastroesophageal reflux disease)    Hyperlipidemia LDL goal <100    Documented coronary calcification with mild CAD.   Leukopenia    Neutropenia (HCC)    Pneumonia    Seasonal allergies     Past Surgical History:  Procedure Laterality Date   COLONOSCOPY     LEFT HEART CATH AND CORONARY ANGIOGRAPHY  07/14/2020   [place], [place], [place] : no flow-limiting lesions.  Proximal LAD calcified lesion: FFR 0.95.  D1 to Strader bifurcation 50% stenosis.  Normal RCA and LCx.   MASTECTOMY Right 02/04/2013   NM MYOVIEW LTD  07/2020   Read as is "abnormal"-referred for cath that was normal.   PLACEMENT OF BREAST IMPLANTS Right 08/2013   POLYPECTOMY     TRANSTHORACIC ECHOCARDIOGRAM  2021   ([place], [place]) normal EF with no RWMA.   TUBAL LIGATION  1973   Reversed in 1981   Tubal ligation reversal  1981    Family History  Problem Relation Age of Onset   Colon cancer Mother 9   Cancer Maternal Uncle        NOS   Cancer Paternal Aunt        2 PAs with unknown cancer   Alzheimer's disease Maternal Grandmother 46   Lung cancer Maternal Grandfather    Colon cancer Paternal Grandmother    Cancer - Colon Paternal Grandmother    Diabetes Mellitus II Paternal Grandfather    Breast cancer Other        MGFs sister   Esophageal cancer Neg Hx    Rectal cancer Neg Hx  Stomach cancer Neg Hx     Social:  reports that she has never smoked. She has been exposed to tobacco smoke. She has never used smokeless tobacco. She reports current alcohol use of about 1.0 standard drink of alcohol per week. She reports that she does not use drugs.  Allergies:  Allergies  Allergen Reactions   E.E.S. [Erythromycin] Diarrhea   Vibramycin [Doxycycline] Rash and Other (See Comments)    Medications: I have reviewed the patient's current medications.  No results found for this or any previous visit (from the past 48 hour(s)).  No results found.  ROS - all of the below systems have been  reviewed with the patient and positives are indicated with bold text General: chills, fever or night sweats Eyes: blurry vision or double vision ENT: epistaxis or sore throat Allergy/Immunology: itchy/watery eyes or nasal congestion Hematologic/Lymphatic: bleeding problems, blood clots or swollen lymph nodes Endocrine: temperature intolerance or unexpected weight changes Breast: new or changing breast lumps or nipple discharge Resp: cough, shortness of breath, or wheezing CV: chest pain or dyspnea on exertion GI: as per HPI GU: dysuria, trouble voiding, or hematuria MSK: joint pain or joint stiffness Neuro: TIA or stroke symptoms Derm: pruritus and skin lesion changes Psych: anxiety and depression  PE Blood pressure 138/86, pulse 66, temperature (!) 97.4 F (36.3 C), temperature source Oral, resp. rate 16, SpO2 100 %. Constitutional: NAD; conversant Eyes: Moist conjunctiva; no lid lag Lungs: Normal respiratory effort CV: RRR GI: Abd soft, NT/ND; no palpable hepatosplenomegaly MSK: Normal range of motion of extremities; no clubbing/cyanosis Psychiatric: Appropriate affect; alert and oriented x3  No results found for this or any previous visit (from the past 48 hour(s)).  No results found.  A/P: Caitlin Parks is an 76 y.o. 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  Determined not to be amenable to endoscopic removal  -Genetics eval - no clinically significant variants detected  -The anatomy and physiology of the GI tract was reviewed with the patient. The pathophysiology of colon polyps and cancer was discussed as well with associated pictures. -We have discussed various different treatment options going forward including surgery (the most definitive) to address this -laparoscopic right hemicolectomy -We discussed  conducting an 'oncologically appropriate' surgery given the high-grade dysplasia in particular and potential for underlying colon cancer, although to date, no definitive evidence of this. -The planned procedure, material risks (including, but not limited to, pain, bleeding, infection, scarring, need for blood transfusion, damage to surrounding structures- blood vessels/nerves/viscus/organs, damage to ureter, urine leak, leak from anastomosis, need for additional procedures, scenarios where a stoma may be necessary and where it may be permanent, worsening of pre-existing medical conditions, hernia, recurrence, pneumonia, heart attack, stroke, death) benefits and alternatives to surgery were discussed at length. The patient's questions were answered to her satisfaction, she voiced understanding and elected to proceed with surgery. Additionally, we discussed typical postoperative expectations and the recovery process.  Nadeen Landau, [facility], [place]

## 2022-05-18 NOTE — Transfer of Care (Signed)
Immediate Anesthesia Transfer of Care Note  Patient: Caitlin Parks  Procedure(s) Performed: LAPAROSCOPIC RIGHT HEMI COLECTOMY (Right)  Patient Location: PACU  Anesthesia Type:General  Level of Consciousness: awake, alert  and patient cooperative  Airway & Oxygen Therapy: Patient Spontanous Breathing and Patient connected to face mask oxygen  Post-op Assessment: Report given to RN and Post -op Vital signs reviewed and stable  Post vital signs: Reviewed and stable  Last Vitals:  Vitals Value Taken Time  BP 135/68 05/18/22 1057  Temp    Pulse 85 05/18/22 1100  Resp 14 05/18/22 1100  SpO2 100 % 05/18/22 1100  Vitals shown include unvalidated device data.  Last Pain:  Vitals:   05/18/22 0822  TempSrc:   PainSc: 0-No pain         Complications: No notable events documented.

## 2022-05-18 NOTE — Op Note (Addendum)
PATIENT: Caitlin Parks  76 y.o. 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 Olawale Marney, MD  ASSISTANT: Leighton Ruff, MD  ANESTHESIA: General endotracheal  EBL: 20 mL Total I/O In: 100 [IV Piggyback:100] Out: 8 [Blood:20]  DRAINS: None  SPECIMEN: Right colon (including terminal ileum, cecum, ascending colon, proximal transverse colon)  COUNTS: Sponge, needle and instrument counts were reported correct x2  FINDINGS:  Normal-appearing liver, normal-appearing peritoneal surfaces.  Given her having had this polypoid lesion on her ileocecal valve, we carried out the planned laparoscopic right hemicolectomy.  We included the right branch of the middle colic vessels as well.  An ileocolic anastomosis was fashioned at the level of the mid transverse colon.   NARRATIVE:  The patient was identified & brought into the operating room, placed supine on the operating table and SCDs were applied to the lower extremities. General endotracheal anesthesia was induced. She was positioned supine with arms tucked. Antibiotics were administered. A foley catheter was placed under sterile conditions. Hair in the region of planned surgery was clipped. The abdomen was prepped and draped in a sterile fashion. A timeout was performed confirming our patient and plan.   Beginning with the extraction port, a supraumbilical incision was made and carried down to the midline fascia. This was then incised with electrocautery. The peritoneum was identified and elevated between clamps and carefully opened sharply. A small Alexis wound protector with a cap and associated port was then placed. The abdomen was insufflated to 15 mmHg  with CO2. A laparoscope was placed and camera inspection revealed no evidence of injury. Bilateral TAP blocks were then performed under laparoscopic visualization using a mixture of 0.25% marcaine with epinepherine + Exparel. 3 additional ports were then placed under direct laparoscopic visualization - two in the left hemiabdomen and one in the right abdomen. The abdomen was surveyed. The liver and peritoneum appeared normal.  There were no signs of metastatic disease.  She was positioned in trendelenburg with left side down. The ileocolic pedicle was identified. Gentle blunt dissection commenced around the pedicle and the duodenum was identified and freed from the surrounding structures. I then separated the structures bluntly and used the Enseal device to transect these separately.  I developed the retroperitoneal plane bluntly.  I then freed the appendix off its attachments to the pelvic wall. I mobilized the terminal ileum.  I took care to avoid injuring any retroperitoneal structures.  After this I began to mobilize laterally down the Astra Gregg line of Toldt and then took down the hepatic flexure using the Enseal device. I mobilized the omentum off of the right transverse colon. The entire colon was then flipped medially and mobilized off of the retroperitoneal structures until I could visualize the lateral edge of the duodenum underneath.  I gently freed the duodenal attachments.   At this point, the abdomen was desufflated and the terminal ileum and right colon delivered through the wound protector. The terminal ileum was then transected using a GIA blue load stapler. The remaining mesentery was divided using the Enseal device. The divided mesentery was inspected and noted to be hemostatic. The distal point of transection was then identified on the transverse colon at a location that included the right branch of the middle colics, leaving  the main middle colic feeding the remaining transverse colon. This was  transected using another blue load GIA stapler.  The specimen was then passed off. Attention was turned to creating the anastomosis. The terminal ileum and transverse colon were inspected for orientation to ensure no twisting nor bowel included in the mesenteric defect. An anastomosis was created between the terminal ileum and the transverse colon using a 75 mm GIA blue load stapler. The staple line was inspected and noted to be hemostatic.  The common enterotomy channel was closed using a TA 90 blue load stapler. Hemostasis was achieved at the staple line using 3-0 silk U-stitches. 3-0 silk sutures were used to imbricate the corners of the staple line as well.  A 2-0 silk suture was placed securing the apex of the anastomosis. The anastomosis was palpated and noted to be widely patent. This was then placed back into the abdomen. The abdomen was then irrigated with sterile saline and hemostasis verified. The omentum was then brought down over the anastomosis. The wound protector cap was replaced and CO2 reinsufflated. The laparoscopic ports were removed under direct visualization and the sites noted to be hemostatic. The Alexis wound protector was removed, counts were reported correct, and we switched to clean instruments, gowns and drapes.   The fascia was then closed using two running #1 PDS sutures.  The skin of all incision sites was closed with 4-0 monocryl subcuticular suture. Dermabond was placed on the port sites and a sterile dressing was placed over the abdominal incision. All counts were reported correct. She was then awakened from anesthesia and sent to the post anesthesia care unit in stable condition.   DISPOSITION: PACU in satisfactory condition

## 2022-05-18 NOTE — Discharge Instructions (Signed)
POST OP INSTRUCTIONS AFTER COLON SURGERY  DIET: Be sure to include lots of fluids daily to stay hydrated - 64oz of water per day (8, 8 oz glasses).  Avoid fast food or heavy meals for the first couple of weeks as your are more likely to get nauseated. Avoid raw/uncooked fruits or vegetables for the first 4 weeks (its ok to have these if they are blended into smoothie form). If you have fruits/vegetables, make sure they are cooked until soft enough to mash on the roof of your mouth and chew your food well. Otherwise, diet as tolerated.  Take your usually prescribed home medications unless otherwise directed.  PAIN CONTROL: Pain is best controlled by a usual combination of three different methods TOGETHER: Ice/Heat Over the counter pain medication Prescription pain medication Most patients will experience some swelling and bruising around the surgical site.  Ice packs or heating pads (30-60 minutes up to 6 times a day) will help. Some people prefer to use ice alone, heat alone, alternating between ice & heat.  Experiment to what works for you.  Swelling and bruising can take several weeks to resolve.   It is helpful to take an over-the-counter pain medication regularly for the first few weeks: Ibuprofen (Motrin/Advil) - 200mg tabs - take 3 tabs (600mg) every 6 hours as needed for pain (unless you have been directed previously to avoid NSAIDs/ibuprofen) Acetaminophen (Tylenol) - you may take 650mg every 6 hours as needed. You can take this with motrin as they act differently on the body. If you are taking a narcotic pain medication that has acetaminophen in it, do not take over the counter tylenol at the same time. NOTE: You may take both of these medications together - most patients  find it most helpful when alternating between the two (i.e. Ibuprofen at 6am, tylenol at 9am, ibuprofen at 12pm ...) A  prescription for pain medication should be given to you upon discharge.  Take your pain medication as  prescribed if your pain is not adequatly controlled with the over-the-counter pain reliefs mentioned above.  Avoid getting constipated.  Between the surgery and the pain medications, it is common to experience some constipation.  Increasing fluid intake and taking a fiber supplement (such as Metamucil, Citrucel, FiberCon, MiraLax, etc) 1-2 times a day regularly will usually help prevent this problem from occurring.  A mild laxative (prune juice, Milk of Magnesia, MiraLax, etc) should be taken according to package directions if there are no bowel movements after 48 hours.    Dressing: Your incisions are covered in Dermabond which is like sterile superglue for the skin. This will come off on it's own in a couple weeks. It is waterproof and you may bathe normally starting the day after your surgery in a shower. Avoid baths/pools/lakes/oceans until your wounds have fully healed.  ACTIVITIES as tolerated:   Avoid heavy lifting (>10lbs or 1 gallon of milk) for the next 6 weeks. You may resume regular daily activities as tolerated--such as daily self-care, walking, climbing stairs--gradually increasing activities as tolerated.  If you can walk 30 minutes without difficulty, it is safe to try more intense activity such as jogging, treadmill, bicycling, low-impact aerobics.  DO NOT PUSH THROUGH PAIN.  Let pain be your guide: If it hurts to do something, don't do it. You may drive when you are no longer taking prescription pain medication, you can comfortably wear a seatbelt, and you can safely maneuver your car and apply brakes.  FOLLOW UP in our   office Please call [facility] at [phone] to set up an appointment to see your surgeon in the office for a follow-up appointment approximately 2 weeks after your surgery. Make sure that you call for this appointment the day you arrive home to insure a convenient appointment time.  9. If you have disability or family leave forms that need to be completed, you may have  them completed by your primary care physician's office; for return to work instructions, please ask our office staff and they will be happy to assist you in obtaining this documentation   When to call us [phone]: Poor pain control Reactions / problems with new medications (rash/itching, etc)  Fever over 101.5 F (38.5 C) Inability to urinate Nausea/vomiting Worsening swelling or bruising Continued bleeding from incision. Increased pain, redness, or drainage from the incision  The clinic staff is available to answer your questions during regular business hours (8:30am-5pm).  Please don't hesitate to call and ask to speak to one of our nurses for clinical concerns.   A surgeon from [facility] is always on call at the hospitals   If you have a medical emergency, go to the nearest emergency room or call 911.  [facility] [address], [address], [place], [place]  [address] MAIN: [phone] FAX: [phone] [email or web address]  

## 2022-05-18 NOTE — Progress Notes (Signed)
Patient called this morning, with complaints of nausea, patient stated that she vomited yesterday and today she just has some gagging.  Patient stated that she felt like she could not drink the pre-surgery ensure.  Patient is concerned she is not completely cleaned out from her bowel prep.  But the patient is barely having stool when going to the bathroom.  Instructed patient to plan to arrive as scheduled at 0745 and that we can address her concerns once she arrives.

## 2022-05-18 NOTE — Anesthesia Postprocedure Evaluation (Signed)
Anesthesia Post Note  Patient: Caitlin Parks  Procedure(s) Performed: LAPAROSCOPIC RIGHT HEMI COLECTOMY (Right)     Patient location during evaluation: PACU Anesthesia Type: General Level of consciousness: awake and alert Pain management: pain level controlled Vital Signs Assessment: post-procedure vital signs reviewed and stable Respiratory status: spontaneous breathing, nonlabored ventilation, respiratory function stable and patient connected to nasal cannula oxygen Cardiovascular status: blood pressure returned to baseline and stable Postop Assessment: no apparent nausea or vomiting Anesthetic complications: no   No notable events documented.  Last Vitals:  Vitals:   05/18/22 1200 05/18/22 1308  BP: (!) 129/57 132/65  Pulse: 70 60  Resp: 17 18  Temp: (!) 36.3 C 36.4 C  SpO2: 100% 100%    Last Pain:  Vitals:   05/18/22 1431  TempSrc:   PainSc: 3                  Barnet Glasgow

## 2022-05-19 ENCOUNTER — Other Ambulatory Visit (HOSPITAL_COMMUNITY): Payer: Self-pay

## 2022-05-19 ENCOUNTER — Encounter (HOSPITAL_COMMUNITY): Payer: Self-pay | Admitting: Surgery

## 2022-05-19 LAB — CBC WITH DIFFERENTIAL/PLATELET
Abs Immature Granulocytes: 0.01 10*3/uL (ref 0.00–0.07)
Basophils Absolute: 0 10*3/uL (ref 0.0–0.1)
Basophils Relative: 0 %
Eosinophils Absolute: 0 10*3/uL (ref 0.0–0.5)
Eosinophils Relative: 0 %
HCT: 30.8 % — ABNORMAL LOW (ref 36.0–46.0)
Hemoglobin: 10.4 g/dL — ABNORMAL LOW (ref 12.0–15.0)
Immature Granulocytes: 0 %
Lymphocytes Relative: 37 %
Lymphs Abs: 1.5 10*3/uL (ref 0.7–4.0)
MCH: 36.2 pg — ABNORMAL HIGH (ref 26.0–34.0)
MCHC: 33.8 g/dL (ref 30.0–36.0)
MCV: 107.3 fL — ABNORMAL HIGH (ref 80.0–100.0)
Monocytes Absolute: 0.5 10*3/uL (ref 0.1–1.0)
Monocytes Relative: 11 %
Neutro Abs: 2.1 10*3/uL (ref 1.7–7.7)
Neutrophils Relative %: 52 %
Platelets: 147 10*3/uL — ABNORMAL LOW (ref 150–400)
RBC: 2.87 MIL/uL — ABNORMAL LOW (ref 3.87–5.11)
RDW: 13.1 % (ref 11.5–15.5)
WBC: 4.1 10*3/uL (ref 4.0–10.5)
nRBC: 0 % (ref 0.0–0.2)

## 2022-05-19 LAB — CBC
HCT: 33.1 % — ABNORMAL LOW (ref 36.0–46.0)
Hemoglobin: 11.2 g/dL — ABNORMAL LOW (ref 12.0–15.0)
MCH: 36.1 pg — ABNORMAL HIGH (ref 26.0–34.0)
MCHC: 33.8 g/dL (ref 30.0–36.0)
MCV: 106.8 fL — ABNORMAL HIGH (ref 80.0–100.0)
Platelets: 149 10*3/uL — ABNORMAL LOW (ref 150–400)
RBC: 3.1 MIL/uL — ABNORMAL LOW (ref 3.87–5.11)
RDW: 13.1 % (ref 11.5–15.5)
WBC: 4.8 10*3/uL (ref 4.0–10.5)
nRBC: 0 % (ref 0.0–0.2)

## 2022-05-19 LAB — BASIC METABOLIC PANEL
Anion gap: 6 (ref 5–15)
BUN: 9 mg/dL (ref 8–23)
CO2: 25 mmol/L (ref 22–32)
Calcium: 8.9 mg/dL (ref 8.9–10.3)
Chloride: 105 mmol/L (ref 98–111)
Creatinine, Ser: 0.82 mg/dL (ref 0.44–1.00)
GFR, Estimated: >60 mL/min (ref >60)
Glucose, Bld: 99 mg/dL (ref 70–99)
Potassium: 3.6 mmol/L (ref 3.5–5.1)
Sodium: 136 mmol/L (ref 135–145)

## 2022-05-19 MED ORDER — LACTATED RINGERS IV SOLN: INTRAVENOUS | Status: DC

## 2022-05-19 MED ORDER — SODIUM CHLORIDE 0.9 % IV BOLUS
1000.0000 mL | Freq: Once | INTRAVENOUS | Status: AC
  Administered 2022-05-19: 1000 mL via INTRAVENOUS

## 2022-05-19 NOTE — Significant Event (Addendum)
Rapid Response Event Note   Reason for Call : Notified by beside RN that patient had just walked the unit with patient's daughter and went back to chair. Patient started to feel dizzy, BP was check and patient was hypotensive. Patient did not pass out but remained dizzy. Upon arrival, patient was back in bed having a bowel movement on a bed pan. Patient's bowel movement was maroon and bloody in appearance as well as liquid in consistency. Patient also received IMDUR '30mg'$  and Metoprolol tartrate tablet '25mg'$  at approximately 0800. She also received heparin 5,000 units Subq injection at 0558.  Initial Focused Assessment:  Neuro: Alert and oriented x4, dizzy, able to follow basic commands as well as move all extremities. Patient not complaining of any pain. Cardiac: Brady (HR 50s-58), BP hypotensive see vital signs Pulmonary: No apparent respiratory distress, O2 100% on RA Abdomen: Lap sites look clean and dry, attached edges, no bruising around them or on abdomen   Interventions:  Notified MD White Fluids restarted at 81m/hr  STAT CBC 1 Liter Normal Saline Bolus   Plan of Care:  Hold BP medications as well as heparin or any anticoagulants due to hypotension and bleeding respectively. Continue to monitor blood pressures for hypotension. Patient is currently receiving 1 liter bolus of normal saline hopefully hypotension resolves with fluid resuscitation.  If hypotension remains, may need stepdown for closer monitoring.    Event Summary:   MD Notified: MD White notified by bedside RN  Call Time: 1000 Arrival Time: Rapid Response [place] at [place] Time: ongoing   [place] Hanlon C, RN

## 2022-05-19 NOTE — Progress Notes (Signed)
Pt complaining of mid to lower stomach pain, 5/10. States is a "pulling" pain, skin w/d, no nausea at this time. Pt inct of mod amt of liq maroon colored. PO pain med given, will cont to monitor

## 2022-05-19 NOTE — Progress Notes (Signed)
Called to rm by family stating that pt was dizzy. Pt sitting in recliner, sl diaphoretic, states is seeing black dots/dizzy. Denied pain but c/o's nausea. VS taken and pt assisted back to bed, placed in trendelenberg. Iv's reconnected (LR @ 150), MD paged. Noted pt inct of several small mostly maroon liquid Bms. CN and RR nurse also notifed.

## 2022-05-19 NOTE — Progress Notes (Signed)
  Subjective No acute events. Doing well. No complaints. Tolerating clears without n/v. Reports she had BM last night.   Objective: Vital signs in last 24 hours: Temp:  [97.3 F (36.3 C)-98.3 F (36.8 C)] 97.7 F (36.5 C) (06/15 0608) Pulse Rate:  [60-88] 63 (06/15 0608) Resp:  [12-18] 18 (06/15 0608) BP: (117-143)/(57-68) 136/65 (06/15 0608) SpO2:  [98 %-100 %] 99 % (06/15 9794) Weight:  [70.6 kg] 70.6 kg (06/14 1308) Last BM Date : 05/19/22  Intake/Output from previous day: 06/14 0701 - 06/15 0700 In: 2772 [P.O.:1080; I.V.:1592; IV Piggyback:100] Out: 2680 [Urine:2660; Blood:20] Intake/Output this shift: No intake/output data recorded.  Gen: NAD, comfortable CV: RRR Pulm: Normal work of breathing Abd: Soft, NT/ND  Ext: SCDs in place  Lab Results: CBC  Recent Labs    05/19/22 0638  WBC 4.8  HGB 11.2*  HCT 33.1*  PLT 149*   BMET Recent Labs    05/19/22 0638  NA 136  K 3.6  CL 105  CO2 25  GLUCOSE 99  BUN 9  CREATININE 0.82  CALCIUM 8.9   PT/INR No results for input(s): "LABPROT", "INR" in the last 72 hours. ABG No results for input(s): "PHART", "HCO3" in the last 72 hours.  Invalid input(s): "PCO2", "PO2"  Studies/Results:  Anti-infectives: Anti-infectives (From admission, onward)    Start     Dose/Rate Route Frequency Ordered Stop   05/18/22 1400  neomycin (MYCIFRADIN) tablet 1,000 mg  Status:  Discontinued       See Hyperspace for full Linked Orders Report.   1,000 mg Oral 3 times per day 05/18/22 0801 05/18/22 0803   05/18/22 1400  metroNIDAZOLE (FLAGYL) tablet 1,000 mg  Status:  Discontinued       See Hyperspace for full Linked Orders Report.   1,000 mg Oral 3 times per day 05/18/22 0801 05/18/22 0803   05/18/22 0815  cefoTEtan (CEFOTAN) 2 g in sodium chloride 0.9 % 100 mL IVPB        2 g 200 mL/hr over 30 Minutes Intravenous On call to O.R. 05/18/22 0801 05/18/22 0925        Assessment/Plan: Patient Active Problem List   Diagnosis  Date Noted   S/P right hemicolectomy 05/18/2022   Genetic testing 05/06/2022   Left hip pain 04/20/2022   Family history of breast cancer 04/14/2022   Family history of colon cancer 04/14/2022   Essential hypertension 10/18/2021   Hyperlipidemia with target LDL less than 100 10/18/2021   Coronary artery disease, non-occlusive 10/18/2021   Adenomatous colon polyp 2018   Breast cancer in female [facility]) 02/2013   s/p Procedure(s): LAPAROSCOPIC RIGHT HEMI COLECTOMY 05/18/2022  -We spent time reviewing her procedure, findings and plans this morning. All questions answered. Family in room with her today  -Advance to full liquids, then soft as tolerated -D/C IVF -D/C Foley -Ppx: SQH, SCDs   LOS: 1 day   Nadeen Landau, MD [facility], [place]

## 2022-05-19 NOTE — Progress Notes (Signed)
Transition of Care [facility]) Screening Note  Patient Details  Name: Caitlin Parks Date of Birth: 21-Dec-1945  Transition of Care [facility]) CM/SW Contact:    Sherie Don, LCSW Phone Number: 05/19/2022, 10:34 AM  Transition of Care Department [facility]) has reviewed patient and no TOC needs have been identified at this time. We will continue to monitor patient advancement through interdisciplinary progression rounds. If new patient transition needs arise, please place a TOC consult.

## 2022-05-20 LAB — CBC
HCT: 33.1 % — ABNORMAL LOW (ref 36.0–46.0)
Hemoglobin: 11.4 g/dL — ABNORMAL LOW (ref 12.0–15.0)
MCH: 35.8 pg — ABNORMAL HIGH (ref 26.0–34.0)
MCHC: 34.4 g/dL (ref 30.0–36.0)
MCV: 104.1 fL — ABNORMAL HIGH (ref 80.0–100.0)
Platelets: 180 10*3/uL (ref 150–400)
RBC: 3.18 MIL/uL — ABNORMAL LOW (ref 3.87–5.11)
RDW: 13.2 % (ref 11.5–15.5)
WBC: 5.8 10*3/uL (ref 4.0–10.5)
nRBC: 0 % (ref 0.0–0.2)

## 2022-05-20 LAB — BASIC METABOLIC PANEL
Anion gap: 9 (ref 5–15)
BUN: 9 mg/dL (ref 8–23)
CO2: 23 mmol/L (ref 22–32)
Calcium: 8.8 mg/dL — ABNORMAL LOW (ref 8.9–10.3)
Chloride: 105 mmol/L (ref 98–111)
Creatinine, Ser: 0.73 mg/dL (ref 0.44–1.00)
GFR, Estimated: >60 mL/min (ref >60)
Glucose, Bld: 134 mg/dL — ABNORMAL HIGH (ref 70–99)
Potassium: 4 mmol/L (ref 3.5–5.1)
Sodium: 137 mmol/L (ref 135–145)

## 2022-05-20 LAB — SURGICAL PATHOLOGY

## 2022-05-20 MED ORDER — SODIUM CHLORIDE 0.9 % IV SOLN: 12.5000 mg | INTRAVENOUS | Status: DC | PRN

## 2022-05-20 MED ORDER — SODIUM CHLORIDE 0.9 % IV SOLN
12.5000 mg | Freq: Four times a day (QID) | INTRAVENOUS | Status: DC | PRN
  Administered 2022-05-20 – 2022-05-21 (×2): 12.5 mg via INTRAVENOUS
  Filled 2022-05-20 (×2): qty 12.5
  Filled 2022-05-20 (×2): qty 0.5

## 2022-05-20 MED ORDER — ONDANSETRON HCL 4 MG PO TABS
4.0000 mg | ORAL_TABLET | ORAL | Status: DC | PRN
  Administered 2022-05-24: 4 mg via ORAL
  Filled 2022-05-20: qty 1

## 2022-05-20 MED ORDER — ONDANSETRON HCL 4 MG/2ML IJ SOLN
4.0000 mg | Freq: Four times a day (QID) | INTRAMUSCULAR | Status: DC | PRN
  Administered 2022-05-21 – 2022-05-22 (×3): 4 mg via INTRAVENOUS
  Filled 2022-05-20 (×3): qty 2

## 2022-05-20 NOTE — Progress Notes (Signed)
  Subjective We D/C'd heparin, aspirin yesterday following some melanotic stool and an apparent vagal type event. Nausea now with some distention. Not interested in food/drink at present. Pain controlled. Denies significant pain, just feels some bloating  Objective: Vital signs in last 24 hours: Temp:  [97.9 F (36.6 C)-100.2 F (37.9 C)] 100.2 F (37.9 C) (06/16 0542) Pulse Rate:  [54-86] 86 (06/16 0542) Resp:  [16-18] 18 (06/16 0542) BP: (75-148)/(44-84) 148/71 (06/16 0542) SpO2:  [95 %-100 %] 96 % (06/16 0542) Weight:  [71.1 kg] 71.1 kg (06/16 0500) Last BM Date : 05/19/22 (6x bloody stools, 2 w/o blood per pt)  Intake/Output from previous day: 06/15 0701 - 06/16 0700 In: 3778.9 [P.O.:820; I.V.:2958.9] Out: 551 [Urine:550; Stool:1] Intake/Output this shift: No intake/output data recorded.  Gen: NAD, comfortable CV: RRR Pulm: Normal work of breathing Abd: Soft, moderately distended, not significantly tender, no rebound nor guarding. Incisions without erythema nor drainage Ext: SCDs in place  Lab Results: CBC  Recent Labs    05/19/22 1011 05/20/22 0437  WBC 4.1 5.8  HGB 10.4* 11.4*  HCT 30.8* 33.1*  PLT 147* 180   BMET Recent Labs    05/19/22 0638 05/20/22 0437  NA 136 137  K 3.6 4.0  CL 105 105  CO2 25 23  GLUCOSE 99 134*  BUN 9 9  CREATININE 0.82 0.73  CALCIUM 8.9 8.8*   PT/INR No results for input(s): "LABPROT", "INR" in the last 72 hours. ABG No results for input(s): "PHART", "HCO3" in the last 72 hours.  Invalid input(s): "PCO2", "PO2"  Studies/Results:  Anti-infectives: Anti-infectives (From admission, onward)    Start     Dose/Rate Route Frequency Ordered Stop   05/18/22 1400  neomycin (MYCIFRADIN) tablet 1,000 mg  Status:  Discontinued       See Hyperspace for full Linked Orders Report.   1,000 mg Oral 3 times per day 05/18/22 0801 05/18/22 0803   05/18/22 1400  metroNIDAZOLE (FLAGYL) tablet 1,000 mg  Status:  Discontinued       See  Hyperspace for full Linked Orders Report.   1,000 mg Oral 3 times per day 05/18/22 0801 05/18/22 0803   05/18/22 0815  cefoTEtan (CEFOTAN) 2 g in sodium chloride 0.9 % 100 mL IVPB        2 g 200 mL/hr over 30 Minutes Intravenous On call to O.R. 05/18/22 0801 05/18/22 0925        Assessment/Plan: Patient Active Problem List   Diagnosis Date Noted   S/P right hemicolectomy 05/18/2022   Genetic testing 05/06/2022   Left hip pain 04/20/2022   Family history of breast cancer 04/14/2022   Family history of colon cancer 04/14/2022   Essential hypertension 10/18/2021   Hyperlipidemia with target LDL less than 100 10/18/2021   Coronary artery disease, non-occlusive 10/18/2021   Adenomatous colon polyp 2018   Breast cancer in female [facility]) 02/2013   s/p Procedure(s): LAPAROSCOPIC RIGHT HEMI COLECTOMY 05/18/2022  -Expected postop ileus, needs more time to resolve -Hgb stable, monitor -Sips of liquids at present, continue MIVF -Ambulate 5x/day -Ppx: SCDs; holding chemical dvt prophylaxis today, repeat cbc tomorrow   LOS: 2 days   Nadeen Landau, MD [facility], [place]

## 2022-05-20 NOTE — Progress Notes (Signed)
Pt c/o's nausea, given po Zofran. Approx 20 min later pt vomited 600 cc's of green material. MD notified.

## 2022-05-20 NOTE — TOC Initial Note (Signed)
Transition of Care [facility]) - Initial/Assessment Note   Patient Details  Name: Caitlin Parks MRN: [id number] Date of Birth: 11-12-1946  Transition of Care [facility]) CM/SW Contact:    Sherie Don, LCSW Phone Number: 05/20/2022, 1:52 PM  Clinical Narrative: CSW notified by RN that family was requesting to speak to CSW about possible discharge needs. CSW spoke with son, Shelba Flake, regarding discharge needs. Per son, he was not sure if patient will need home health after discharge. As there are no orders for [facility], CSW recommended that patient and/or family reach out to [facility] to see if that will be a need at discharge. CSW asked about DME. Per son, patient was not using DME at home prior to her surgery and, although she was using a walker the day after surgery, patient is no longer using it to ambulate in the hallway.  Expected Discharge Plan: Home/Self Care Barriers to Discharge: No Barriers Identified  Patient Goals and CMS Choice Choice offered to / list presented to : NA  Expected Discharge Plan and Services Expected Discharge Plan: Home/Self Care In-house Referral: Clinical Social Work Post Acute Care Choice: NA Living arrangements for the past 2 months: Apartment  Prior Living Arrangements/Services Living arrangements for the past 2 months: Apartment Patient language and need for interpreter reviewed:: Yes Do you feel safe going back to the place where you live?: Yes      Need for Family Participation in Patient Care: No (Comment) Care giver support system in place?: Yes (comment) Criminal Activity/Legal Involvement Pertinent to Current Situation/Hospitalization: No - Comment as needed  Activities of Daily Living Home Assistive Devices/Equipment: None ADL Screening (condition at time of admission) Patient's cognitive ability adequate to safely complete daily activities?: Yes Is the patient deaf or have difficulty hearing?: No Does the patient have difficulty seeing, even when  wearing glasses/contacts?: No Does the patient have difficulty concentrating, remembering, or making decisions?: No Patient able to express need for assistance with ADLs?: Yes Does the patient have difficulty dressing or bathing?: No Independently performs ADLs?: Yes (appropriate for developmental age) Does the patient have difficulty walking or climbing stairs?: No Weakness of Legs: None Weakness of Arms/Hands: None  Emotional Assessment Orientation: : Oriented to Self, Oriented to Place, Oriented to  Time, Oriented to Situation Alcohol / Substance Use: Not Applicable Psych Involvement: No (comment)  Admission diagnosis:  S/P right hemicolectomy [Z90.49] Patient Active Problem List   Diagnosis Date Noted   S/P right hemicolectomy 05/18/2022   Genetic testing 05/06/2022   Left hip pain 04/20/2022   Family history of breast cancer 04/14/2022   Family history of colon cancer 04/14/2022   Essential hypertension 10/18/2021   Hyperlipidemia with target LDL less than 100 10/18/2021   Coronary artery disease, non-occlusive 10/18/2021   Adenomatous colon polyp 2018   Breast cancer in female [facility]) 02/2013   PCP:  Fredirick Lathe, PA-C Pharmacy:   [place], [place] [address] Phone: [phone] Fax: [phone]  Readmission Risk Interventions     No data to display

## 2022-05-20 NOTE — Care Management Important Message (Signed)
Important Message  Patient Details IM Letter given to the Patient Name: Caitlin Parks MRN: [id number] Date of Birth: 24-Nov-1946   Medicare Important Message Given:  Yes     Kerin Salen 05/20/2022, 3:33 PM

## 2022-05-21 ENCOUNTER — Inpatient Hospital Stay (HOSPITAL_COMMUNITY): Payer: Medicare Other

## 2022-05-21 ENCOUNTER — Other Ambulatory Visit (HOSPITAL_COMMUNITY): Payer: Self-pay

## 2022-05-21 LAB — CBC
HCT: 33.7 % — ABNORMAL LOW (ref 36.0–46.0)
Hemoglobin: 11.2 g/dL — ABNORMAL LOW (ref 12.0–15.0)
MCH: 36.1 pg — ABNORMAL HIGH (ref 26.0–34.0)
MCHC: 33.2 g/dL (ref 30.0–36.0)
MCV: 108.7 fL — ABNORMAL HIGH (ref 80.0–100.0)
Platelets: 172 10*3/uL (ref 150–400)
RBC: 3.1 MIL/uL — ABNORMAL LOW (ref 3.87–5.11)
RDW: 13.5 % (ref 11.5–15.5)
WBC: 5.1 10*3/uL (ref 4.0–10.5)
nRBC: 0 % (ref 0.0–0.2)

## 2022-05-21 LAB — BASIC METABOLIC PANEL
Anion gap: 7 (ref 5–15)
BUN: 14 mg/dL (ref 8–23)
CO2: 26 mmol/L (ref 22–32)
Calcium: 8.8 mg/dL — ABNORMAL LOW (ref 8.9–10.3)
Chloride: 102 mmol/L (ref 98–111)
Creatinine, Ser: 0.74 mg/dL (ref 0.44–1.00)
GFR, Estimated: >60 mL/min (ref >60)
Glucose, Bld: 124 mg/dL — ABNORMAL HIGH (ref 70–99)
Potassium: 3.7 mmol/L (ref 3.5–5.1)
Sodium: 135 mmol/L (ref 135–145)

## 2022-05-21 MED ORDER — PHENOL 1.4 % MT LIQD
1.0000 | OROMUCOSAL | Status: DC | PRN
Start: 2022-05-21 — End: 2022-05-25
  Administered 2022-05-21: 1 via OROMUCOSAL
  Filled 2022-05-21: qty 177

## 2022-05-21 MED ORDER — METOPROLOL TARTRATE 5 MG/5ML IV SOLN
5.0000 mg | Freq: Four times a day (QID) | INTRAVENOUS | Status: DC
  Administered 2022-05-21 – 2022-05-25 (×14): 5 mg via INTRAVENOUS
  Filled 2022-05-21 (×14): qty 5

## 2022-05-21 NOTE — Progress Notes (Signed)
#   16 NG tube inserted into L nare w/o difficulty w immediate return of 500 cc's brownish liquid. Awaiting xray verification. Tol procedure well.

## 2022-05-21 NOTE — Progress Notes (Signed)
Called to rm to ck pt at 1455. Pt desatting to 85%, Pulse tachy in the 120's, BP 137/71, resp 16 and unlabored, skin w/d. Denies CP or SOB but cont to c/o of nausea (Zofran given earlier). Also has low grade temp of 99.3. HOB raised, O2 applied at 2L per Caroline w O2 sat rising to low/mid 90's. Complains of abd pain and noted abd sl more distended than earlier this am. MD notified and STAT EKG and CXR done. Results called to [place], RN in the OR (MD scrubbed). Phenergan given IV w no relief. Will cont to monitor.

## 2022-05-22 LAB — CBC
HCT: 31.6 % — ABNORMAL LOW (ref 36.0–46.0)
Hemoglobin: 10.5 g/dL — ABNORMAL LOW (ref 12.0–15.0)
MCH: 36.1 pg — ABNORMAL HIGH (ref 26.0–34.0)
MCHC: 33.2 g/dL (ref 30.0–36.0)
MCV: 108.6 fL — ABNORMAL HIGH (ref 80.0–100.0)
Platelets: 158 10*3/uL (ref 150–400)
RBC: 2.91 MIL/uL — ABNORMAL LOW (ref 3.87–5.11)
RDW: 13.4 % (ref 11.5–15.5)
WBC: 4.7 10*3/uL (ref 4.0–10.5)
nRBC: 0 % (ref 0.0–0.2)

## 2022-05-22 LAB — BASIC METABOLIC PANEL
Anion gap: 9 (ref 5–15)
BUN: 15 mg/dL (ref 8–23)
CO2: 27 mmol/L (ref 22–32)
Calcium: 8.5 mg/dL — ABNORMAL LOW (ref 8.9–10.3)
Chloride: 103 mmol/L (ref 98–111)
Creatinine, Ser: 0.7 mg/dL (ref 0.44–1.00)
GFR, Estimated: >60 mL/min (ref >60)
Glucose, Bld: 92 mg/dL (ref 70–99)
Potassium: 3.5 mmol/L (ref 3.5–5.1)
Sodium: 139 mmol/L (ref 135–145)

## 2022-05-22 MED ORDER — POTASSIUM CHLORIDE 10 MEQ/100ML IV SOLN
10.0000 meq | INTRAVENOUS | Status: AC
  Administered 2022-05-22 (×2): 10 meq via INTRAVENOUS
  Filled 2022-05-22 (×2): qty 100

## 2022-05-22 MED ORDER — KCL IN DEXTROSE-NACL 20-5-0.45 MEQ/L-%-% IV SOLN
INTRAVENOUS | Status: DC
  Filled 2022-05-22 (×8): qty 1000

## 2022-05-22 MED ORDER — HEPARIN SODIUM (PORCINE) 5000 UNIT/ML IJ SOLN
5000.0000 [IU] | Freq: Three times a day (TID) | INTRAMUSCULAR | Status: DC
Start: 2022-05-22 — End: 2022-05-25
  Administered 2022-05-22 – 2022-05-25 (×9): 5000 [IU] via SUBCUTANEOUS
  Filled 2022-05-22 (×9): qty 1

## 2022-05-22 NOTE — Progress Notes (Signed)
  Subjective Nausea and emesis despite zofran. NG placed with 500 cc of brown  fluid in stomach immediately removed. Low grade sinus tachycardia and desaturation to upper 80s - resolved with 2L [place]. CXR shows bibasilar infiltrates, ?aspiration?   Now feeling much better. Passing flatus again. No BM since 2d ago. No n/v with NG in place. No SOB. O2 sats on room air now in 90s  Objective: Vital signs in last 24 hours: Temp:  [98.4 F (36.9 C)-99.5 F (37.5 C)] 98.8 F (37.1 C) (06/18 0506) Pulse Rate:  [89-114] 89 (06/18 0506) Resp:  [14-18] 14 (06/18 0506) BP: (127-154)/(69-76) 145/69 (06/18 0506) SpO2:  [88 %-99 %] 97 % (06/18 0506) Weight:  [73.4 kg] 73.4 kg (06/18 0500) Last BM Date : 05/20/22  Intake/Output from previous day: 06/17 0701 - 06/18 0700 In: 2740 [P.O.:120; I.V.:2520; IV Piggyback:100] Out: 1200 [Urine:250; Emesis/NG output:950] Intake/Output this shift: No intake/output data recorded.  Gen: NAD, comfortable CV: RRR Pulm: Normal work of breathing Abd: Soft, mildly distended, not significantly tender, no rebound nor guarding. Incisions without erythema nor drainage Ext: SCDs in place  Lab Results: CBC  Recent Labs    05/20/22 0437 05/21/22 0503  WBC 5.8 5.1  HGB 11.4* 11.2*  HCT 33.1* 33.7*  PLT 180 172   BMET Recent Labs    05/20/22 0437 05/21/22 0503  NA 137 135  K 4.0 3.7  CL 105 102  CO2 23 26  GLUCOSE 134* 124*  BUN 9 14  CREATININE 0.73 0.74  CALCIUM 8.8* 8.8*   PT/INR No results for input(s): "LABPROT", "INR" in the last 72 hours. ABG No results for input(s): "PHART", "HCO3" in the last 72 hours.  Invalid input(s): "PCO2", "PO2"  Studies/Results:  Anti-infectives: Anti-infectives (From admission, onward)    Start     Dose/Rate Route Frequency Ordered Stop   05/18/22 1400  neomycin (MYCIFRADIN) tablet 1,000 mg  Status:  Discontinued       See Hyperspace for full Linked Orders Report.   1,000 mg Oral 3 times per day 05/18/22  0801 05/18/22 0803   05/18/22 1400  metroNIDAZOLE (FLAGYL) tablet 1,000 mg  Status:  Discontinued       See Hyperspace for full Linked Orders Report.   1,000 mg Oral 3 times per day 05/18/22 0801 05/18/22 0803   05/18/22 0815  cefoTEtan (CEFOTAN) 2 g in sodium chloride 0.9 % 100 mL IVPB        2 g 200 mL/hr over 30 Minutes Intravenous On call to O.R. 05/18/22 0801 05/18/22 0925        Assessment/Plan: Patient Active Problem List   Diagnosis Date Noted   S/P right hemicolectomy 05/18/2022   Genetic testing 05/06/2022   Left hip pain 04/20/2022   Family history of breast cancer 04/14/2022   Family history of colon cancer 04/14/2022   Essential hypertension 10/18/2021   Hyperlipidemia with target LDL less than 100 10/18/2021   Coronary artery disease, non-occlusive 10/18/2021   Adenomatous colon polyp 2018   Breast cancer in female [facility]) 02/2013   s/p Procedure(s): LAPAROSCOPIC RIGHT HEMI COLECTOMY 05/18/2022  -Expected postop ileus, will continue NG tube. Appears to be resolving as now passing flatus. Abd XR shows gas in colon, but with SB distention as well -NPO, MIVF - change to D5 1/2 NS with 20 mEq KCl -Ambulate 5x/day -Ppx: SCDs; restart chemical dvt ppx today   LOS: 4 days   Nadeen Landau, MD [facility] Surgery, [place]

## 2022-05-22 NOTE — Plan of Care (Signed)
  Problem: Bowel/Gastric: Goal: Gastrointestinal status for postoperative course will improve Outcome: Progressing   Problem: Coping: Goal: Level of anxiety will decrease Outcome: Progressing   Problem: Pain Managment: Goal: General experience of comfort will improve Outcome: Progressing   Problem: Safety: Goal: Ability to remain free from injury will improve Outcome: Progressing

## 2022-05-22 NOTE — Progress Notes (Signed)
  Subjective No futher bloody Bms. No flatus either. Some nausea.  Objective: Vital signs in last 24 hours: Temp:  [98.4 F (36.9 C)-99.5 F (37.5 C)] 98.8 F (37.1 C) (06/18 0506) Pulse Rate:  [89-114] 89 (06/18 0506) Resp:  [14-18] 14 (06/18 0506) BP: (127-154)/(69-76) 145/69 (06/18 0506) SpO2:  [88 %-99 %] 97 % (06/18 0506) Weight:  [73.4 kg] 73.4 kg (06/18 0500) Last BM Date : 05/20/22  Intake/Output from previous day: 06/17 0701 - 06/18 0700 In: 2740 [P.O.:120; I.V.:2520; IV Piggyback:100] Out: 1200 [Urine:250; Emesis/NG output:950] Intake/Output this shift: No intake/output data recorded.  Gen: NAD, comfortable CV: RRR Pulm: Normal work of breathing Abd: Soft, moderately distended, not significantly tender, no rebound nor guarding. Incisions without erythema nor drainage Ext: SCDs in place  Lab Results: CBC  Recent Labs    05/20/22 0437 05/21/22 0503  WBC 5.8 5.1  HGB 11.4* 11.2*  HCT 33.1* 33.7*  PLT 180 172   BMET Recent Labs    05/20/22 0437 05/21/22 0503  NA 137 135  K 4.0 3.7  CL 105 102  CO2 23 26  GLUCOSE 134* 124*  BUN 9 14  CREATININE 0.73 0.74  CALCIUM 8.8* 8.8*   PT/INR No results for input(s): "LABPROT", "INR" in the last 72 hours. ABG No results for input(s): "PHART", "HCO3" in the last 72 hours.  Invalid input(s): "PCO2", "PO2"  Studies/Results:  Anti-infectives: Anti-infectives (From admission, onward)    Start     Dose/Rate Route Frequency Ordered Stop   05/18/22 1400  neomycin (MYCIFRADIN) tablet 1,000 mg  Status:  Discontinued       See Hyperspace for full Linked Orders Report.   1,000 mg Oral 3 times per day 05/18/22 0801 05/18/22 0803   05/18/22 1400  metroNIDAZOLE (FLAGYL) tablet 1,000 mg  Status:  Discontinued       See Hyperspace for full Linked Orders Report.   1,000 mg Oral 3 times per day 05/18/22 0801 05/18/22 0803   05/18/22 0815  cefoTEtan (CEFOTAN) 2 g in sodium chloride 0.9 % 100 mL IVPB        2 g 200  mL/hr over 30 Minutes Intravenous On call to O.R. 05/18/22 0801 05/18/22 0925        Assessment/Plan: Patient Active Problem List   Diagnosis Date Noted   S/P right hemicolectomy 05/18/2022   Genetic testing 05/06/2022   Left hip pain 04/20/2022   Family history of breast cancer 04/14/2022   Family history of colon cancer 04/14/2022   Essential hypertension 10/18/2021   Hyperlipidemia with target LDL less than 100 10/18/2021   Coronary artery disease, non-occlusive 10/18/2021   Adenomatous colon polyp 2018   Breast cancer in female [facility]) 02/2013   s/p Procedure(s): LAPAROSCOPIC RIGHT HEMI COLECTOMY 05/18/2022  -Expected postop ileus, needs more time to resolve -Sips of liquids at present, continue MIVF -Ambulate 5x/day -Ppx: SCDs; holding chemical dvt prophylaxis today   LOS: 4 days   Nadeen Landau, MD [facility] Surgery, [place]

## 2022-05-23 ENCOUNTER — Other Ambulatory Visit (HOSPITAL_COMMUNITY): Payer: Self-pay

## 2022-05-23 LAB — CBC WITH DIFFERENTIAL/PLATELET
Abs Immature Granulocytes: 0.02 10*3/uL (ref 0.00–0.07)
Basophils Absolute: 0 10*3/uL (ref 0.0–0.1)
Basophils Relative: 0 %
Eosinophils Absolute: 0.1 10*3/uL (ref 0.0–0.5)
Eosinophils Relative: 1 %
HCT: 31.6 % — ABNORMAL LOW (ref 36.0–46.0)
Hemoglobin: 10.4 g/dL — ABNORMAL LOW (ref 12.0–15.0)
Immature Granulocytes: 0 %
Lymphocytes Relative: 25 %
Lymphs Abs: 1.1 10*3/uL (ref 0.7–4.0)
MCH: 35.3 pg — ABNORMAL HIGH (ref 26.0–34.0)
MCHC: 32.9 g/dL (ref 30.0–36.0)
MCV: 107.1 fL — ABNORMAL HIGH (ref 80.0–100.0)
Monocytes Absolute: 0.4 10*3/uL (ref 0.1–1.0)
Monocytes Relative: 10 %
Neutro Abs: 3 10*3/uL (ref 1.7–7.7)
Neutrophils Relative %: 64 %
Platelets: 154 10*3/uL (ref 150–400)
RBC: 2.95 MIL/uL — ABNORMAL LOW (ref 3.87–5.11)
RDW: 13.5 % (ref 11.5–15.5)
WBC: 4.6 10*3/uL (ref 4.0–10.5)
nRBC: 0 % (ref 0.0–0.2)

## 2022-05-23 LAB — BASIC METABOLIC PANEL
Anion gap: 5 (ref 5–15)
BUN: 11 mg/dL (ref 8–23)
CO2: 32 mmol/L (ref 22–32)
Calcium: 8.4 mg/dL — ABNORMAL LOW (ref 8.9–10.3)
Chloride: 102 mmol/L (ref 98–111)
Creatinine, Ser: 0.67 mg/dL (ref 0.44–1.00)
GFR, Estimated: >60 mL/min (ref >60)
Glucose, Bld: 135 mg/dL — ABNORMAL HIGH (ref 70–99)
Potassium: 3.9 mmol/L (ref 3.5–5.1)
Sodium: 139 mmol/L (ref 135–145)

## 2022-05-23 NOTE — Care Management Important Message (Signed)
Important Message  Patient Details IM Letter given to the Patient Name: Caitlin Parks MRN: [id number] Date of Birth: [birth year]   Medicare Important Message Given:  Yes     Kerin Salen 05/23/2022, 2:09 PM

## 2022-05-23 NOTE — Progress Notes (Signed)
  Subjective Feeling MUCH better today. Daughter has noticed she has a burst of energy and feels well. No n/v. Reports her bloating has resolved. Passing flatus. Had BM last night and this morning. No abdominal pain.  Objective: Vital signs in last 24 hours: Temp:  [98.6 F (37 C)-99 F (37.2 C)] 99 F (37.2 C) (06/19 0543) Pulse Rate:  [89-95] 92 (06/19 0543) Resp:  [18] 18 (06/19 0543) BP: (125-150)/(68-70) 150/70 (06/19 0543) SpO2:  [92 %-97 %] 97 % (06/19 0543) Weight:  [76.9 kg] 76.9 kg (06/19 0500) Last BM Date : 05/23/22  Intake/Output from previous day: 06/18 0701 - 06/19 0700 In: 1842 [I.V.:1642; IV Piggyback:200] Out: 1400 [Urine:1200; Emesis/NG output:200] Intake/Output this shift: No intake/output data recorded.  Gen: NAD, comfortable CV: RRR Pulm: Normal work of breathing Abd: Soft, nondistended, not significantly tender, no rebound nor guarding. Incisions without erythema nor drainage Ext: SCDs in place  Lab Results: CBC  Recent Labs    05/22/22 0804 05/23/22 0345  WBC 4.7 4.6  HGB 10.5* 10.4*  HCT 31.6* 31.6*  PLT 158 154   BMET Recent Labs    05/22/22 0804 05/23/22 0345  NA 139 139  K 3.5 3.9  CL 103 102  CO2 27 32  GLUCOSE 92 135*  BUN 15 11  CREATININE 0.70 0.67  CALCIUM 8.5* 8.4*   PT/INR No results for input(s): "LABPROT", "INR" in the last 72 hours. ABG No results for input(s): "PHART", "HCO3" in the last 72 hours.  Invalid input(s): "PCO2", "PO2"  Studies/Results:  Anti-infectives: Anti-infectives (From admission, onward)    Start     Dose/Rate Route Frequency Ordered Stop   05/18/22 1400  neomycin (MYCIFRADIN) tablet 1,000 mg  Status:  Discontinued       See Hyperspace for full Linked Orders Report.   1,000 mg Oral 3 times per day 05/18/22 0801 05/18/22 0803   05/18/22 1400  metroNIDAZOLE (FLAGYL) tablet 1,000 mg  Status:  Discontinued       See Hyperspace for full Linked Orders Report.   1,000 mg Oral 3 times per day  05/18/22 0801 05/18/22 0803   05/18/22 0815  cefoTEtan (CEFOTAN) 2 g in sodium chloride 0.9 % 100 mL IVPB        2 g 200 mL/hr over 30 Minutes Intravenous On call to O.R. 05/18/22 0801 05/18/22 0925        Assessment/Plan: Patient Active Problem List   Diagnosis Date Noted   S/P right hemicolectomy 05/18/2022   Genetic testing 05/06/2022   Left hip pain 04/20/2022   Family history of breast cancer 04/14/2022   Family history of colon cancer 04/14/2022   Essential hypertension 10/18/2021   Hyperlipidemia with target LDL less than 100 10/18/2021   Coronary artery disease, non-occlusive 10/18/2021   Adenomatous colon polyp 2018   Breast cancer in female [facility]) 02/2013   s/p Procedure(s): LAPAROSCOPIC RIGHT HEMI COLECTOMY 05/18/2022  -Doing much better -D/C NG tube; start clear liquids -Advance to soft as tolerated -Will plan to D/C MIVF once reliably tolerating PO intake -Ambulate 5x/day -Ppx: SQH, SCDs   LOS: 5 days   Nadeen Landau, MD [facility] Surgery, [place]

## 2022-05-24 ENCOUNTER — Other Ambulatory Visit (HOSPITAL_COMMUNITY): Payer: Self-pay

## 2022-05-24 NOTE — Progress Notes (Addendum)
  Subjective Feeling great. Tolerating clear liquids without n/v. Passing flatus, having bm. Reports lower liquid intake due to getting full quickly  Objective: Vital signs in last 24 hours: Temp:  [97.6 F (36.4 C)-98.4 F (36.9 C)] 98.3 F (36.8 C) (06/20 0544) Pulse Rate:  [72-95] 72 (06/20 0544) Resp:  [14-18] 18 (06/20 0544) BP: (112-130)/(67-74) 116/67 (06/20 0544) SpO2:  [93 %-98 %] 93 % (06/20 0544) Weight:  [76.5 kg] 76.5 kg (06/20 0500) Last BM Date : 05/24/22  Intake/Output from previous day: 06/19 0701 - 06/20 0700 In: 3349.2 [P.O.:1200; I.V.:2149.2] Out: 500 [Urine:500] Intake/Output this shift: Total I/O In: -  Out: 100 [Urine:100]  Gen: NAD, comfortable CV: RRR Pulm: Normal work of breathing Abd: Soft, nondistended, not significantly tender Ext: SCDs in place  Lab Results: CBC  Recent Labs    05/22/22 0804 05/23/22 0345  WBC 4.7 4.6  HGB 10.5* 10.4*  HCT 31.6* 31.6*  PLT 158 154   BMET Recent Labs    05/22/22 0804 05/23/22 0345  NA 139 139  K 3.5 3.9  CL 103 102  CO2 27 32  GLUCOSE 92 135*  BUN 15 11  CREATININE 0.70 0.67  CALCIUM 8.5* 8.4*   PT/INR No results for input(s): "LABPROT", "INR" in the last 72 hours. ABG No results for input(s): "PHART", "HCO3" in the last 72 hours.  Invalid input(s): "PCO2", "PO2"  Studies/Results:  Anti-infectives: Anti-infectives (From admission, onward)    Start     Dose/Rate Route Frequency Ordered Stop   05/18/22 1400  neomycin (MYCIFRADIN) tablet 1,000 mg  Status:  Discontinued       See Hyperspace for full Linked Orders Report.   1,000 mg Oral 3 times per day 05/18/22 0801 05/18/22 0803   05/18/22 1400  metroNIDAZOLE (FLAGYL) tablet 1,000 mg  Status:  Discontinued       See Hyperspace for full Linked Orders Report.   1,000 mg Oral 3 times per day 05/18/22 0801 05/18/22 0803   05/18/22 0815  cefoTEtan (CEFOTAN) 2 g in sodium chloride 0.9 % 100 mL IVPB        2 g 200 mL/hr over 30 Minutes  Intravenous On call to O.R. 05/18/22 0801 05/18/22 0925        Assessment/Plan: Patient Active Problem List   Diagnosis Date Noted   S/P right hemicolectomy 05/18/2022   Genetic testing 05/06/2022   Left hip pain 04/20/2022   Family history of breast cancer 04/14/2022   Family history of colon cancer 04/14/2022   Essential hypertension 10/18/2021   Hyperlipidemia with target LDL less than 100 10/18/2021   Coronary artery disease, non-occlusive 10/18/2021   Adenomatous colon polyp 2018   Breast cancer in female [facility]) 02/2013   s/p Procedure(s): LAPAROSCOPIC RIGHT HEMI COLECTOMY 05/18/2022  -    Tubular adenoma with focal high-grade dysplasia, completely  resected.  -    Proximal and distal resection margins negative for adenomatous  change.  -    Regional lymph nodes negative for malignancy.  -    Proximal and distal resection margins viable.  -    Appendix surgically absent.   -Adv to soft diet; will see how things go today... if doing great, perhaps home tomorrow -Ambulate 5x/day -Ppx: SQH, SCDs   LOS: 6 days   Nadeen Landau, MD [facility] Surgery, [place]

## 2022-05-25 ENCOUNTER — Other Ambulatory Visit (HOSPITAL_COMMUNITY): Payer: Self-pay

## 2022-05-25 NOTE — Discharge Summary (Signed)
Patient ID: Caitlin Parks MRN: [id number] DOB/AGE: 01/28/1946 76 y.o.  Admit date: 05/18/2022 Discharge date: 05/25/2022  Discharge Diagnoses Patient Active Problem List   Diagnosis Date Noted   S/P right hemicolectomy 05/18/2022   Genetic testing 05/06/2022   Left hip pain 04/20/2022   Family history of breast cancer 04/14/2022   Family history of colon cancer 04/14/2022   Essential hypertension 10/18/2021   Hyperlipidemia with target LDL less than 100 10/18/2021   Coronary artery disease, non-occlusive 10/18/2021   Adenomatous colon polyp 2018   Breast cancer in female [facility]) 02/2013    Consultants None  Procedures Laparoscopic right hemicolectomy 05/18/22  Hospital Course: postoperatively developed some melena which was self limited and stopped within a day. Hgb remained relatively stable. She also had a postoperative ileus that took 3-4 days to resolve requiring NG decompression for 2 days. On 05/25/22 she is comfortable with and stable for discharge home. Expectations postoperatively and plans have been reviewed. Pathology discussed. All questions answered. Follow-up arranged    Allergies as of 05/25/2022       Reactions   E.e.s. [erythromycin] Diarrhea   Vibramycin [doxycycline] Rash, Other (See Comments)        Medication List     STOP taking these medications    metroNIDAZOLE 500 MG tablet Commonly known as: FLAGYL   neomycin 500 MG tablet Commonly known as: MYCIFRADIN       TAKE these medications    aspirin EC 81 MG tablet Take 1 tablet (81 mg total) by mouth daily.   Benefiber Drink Mix Pack Take 1 packet by mouth in the morning and at bedtime.   CENTRUM SILVER PO Take 1 tablet by mouth in the morning.   ciclopirox 8 % solution Commonly known as: Penlac Apply topically at bedtime. Apply over nail and surrounding skin. Apply daily over previous coat. After seven (7) days, may remove with alcohol and continue cycle.   ezetimibe 10 MG  tablet Commonly known as: ZETIA Take 1 tablet (10 mg total) by mouth at bedtime.   fexofenadine 180 MG tablet Commonly known as: ALLEGRA Take 180 mg by mouth daily as needed (allergies.).   fluticasone 50 MCG/ACT nasal spray Commonly known as: FLONASE Place 1-2 sprays into both nostrils daily as needed for allergies.   isosorbide mononitrate 30 MG 24 hr tablet Commonly known as: IMDUR Take 1 tablet (30 mg total) by mouth daily.   losartan 50 MG tablet Commonly known as: COZAAR Take 1 tablet (50 mg total) by mouth daily. What changed: when to take this   metoprolol tartrate 25 MG tablet Commonly known as: LOPRESSOR Take 1 tablet (25 mg total) by mouth 2 (two) times daily.   pantoprazole 20 MG tablet Commonly known as: PROTONIX Take 20 mg by mouth daily as needed for heartburn or indigestion.   PROBIOTIC PO Take 1 capsule by mouth in the morning.   rosuvastatin 10 MG tablet Commonly known as: CRESTOR Take 1 tablet (10 mg total) by mouth daily. What changed: when to take this   traMADol 50 MG tablet Commonly known as: Ultram Take 1 tablet (50 mg total) by mouth every 6 (six) hours as needed for up to 5 days (postop pain not controlled with tylenol/iburprofen first).   vitamin B-12 1000 MCG tablet Commonly known as: CYANOCOBALAMIN Take 1,000 mcg by mouth in the morning.   Vitamin C 500 MG Caps Take 500 mg by mouth in the morning.   Vitamin D3 50 MCG (2000 UT) Tabs Take  2,000 Units by mouth in the morning.          Follow-up Information     Ileana Roup, MD Follow up in 3 week(s).   Specialties: General Surgery, Colon and Rectal Surgery Contact information: [place] [address] [place] Dema Severin, M.D. [facility]

## 2022-05-25 NOTE — Consult Note (Signed)
   [facility] Inpatient Consult   05/25/2022  Ellason Segar 01/09/1946 [id number]  [facility] [ACO] Patient: [employer]  Patient at [employer], remote  Primary Care Provider:  [place], Nicholes Rough, [place] at [facility], is a [facility] provider with a Chronic Care Management [CCM] team and program, and is listed for the transition of care follow up and appointments.  Patient was screened for care coordination needs to assess for post hospital follow up needs. Inpatient TOC team notes reviewed   Plan: No additional needs noted and  TOC needs for post hospital needs is listed to be provided by PCP office.  Please contact for further questions,  Natividad Brood, RN BSN [place] [facility] Liaison  [phone] business mobile phone Toll free office [phone]  Fax number: [phone] [email or web address]

## 2022-05-25 NOTE — Progress Notes (Signed)
  Subjective Feeling well, tolerating diet well. Passing flatus, having bm.   Objective: Vital signs in last 24 hours: Temp:  [97.5 F (36.4 C)-98.4 F (36.9 C)] 97.5 F (36.4 C) (06/21 0602) Pulse Rate:  [69-82] 69 (06/21 0602) Resp:  [15-17] 17 (06/21 0602) BP: (123-138)/(75-103) 138/103 (06/21 0602) SpO2:  [97 %-98 %] 97 % (06/21 0602) Weight:  [76.5 kg] 76.5 kg (06/21 0500) Last BM Date : 05/25/22  Intake/Output from previous day: 06/20 0701 - 06/21 0700 In: 2860.8 [P.O.:600; I.V.:2260.8] Out: 1700 [Urine:1700] Intake/Output this shift: No intake/output data recorded.  Gen: NAD, comfortable CV: RRR Pulm: Normal work of breathing Abd: Soft, nondistended, not significantly tender Ext: SCDs in place  Lab Results: CBC  Recent Labs    05/23/22 0345  WBC 4.6  HGB 10.4*  HCT 31.6*  PLT 154   BMET Recent Labs    05/23/22 0345  NA 139  K 3.9  CL 102  CO2 32  GLUCOSE 135*  BUN 11  CREATININE 0.67  CALCIUM 8.4*   PT/INR No results for input(s): "LABPROT", "INR" in the last 72 hours. ABG No results for input(s): "PHART", "HCO3" in the last 72 hours.  Invalid input(s): "PCO2", "PO2"  Studies/Results:  Anti-infectives: Anti-infectives (From admission, onward)    Start     Dose/Rate Route Frequency Ordered Stop   05/18/22 1400  neomycin (MYCIFRADIN) tablet 1,000 mg  Status:  Discontinued       See Hyperspace for full Linked Orders Report.   1,000 mg Oral 3 times per day 05/18/22 0801 05/18/22 0803   05/18/22 1400  metroNIDAZOLE (FLAGYL) tablet 1,000 mg  Status:  Discontinued       See Hyperspace for full Linked Orders Report.   1,000 mg Oral 3 times per day 05/18/22 0801 05/18/22 0803   05/18/22 0815  cefoTEtan (CEFOTAN) 2 g in sodium chloride 0.9 % 100 mL IVPB        2 g 200 mL/hr over 30 Minutes Intravenous On call to O.R. 05/18/22 0801 05/18/22 0925        Assessment/Plan: Patient Active Problem List   Diagnosis Date Noted   S/P right  hemicolectomy 05/18/2022   Genetic testing 05/06/2022   Left hip pain 04/20/2022   Family history of breast cancer 04/14/2022   Family history of colon cancer 04/14/2022   Essential hypertension 10/18/2021   Hyperlipidemia with target LDL less than 100 10/18/2021   Coronary artery disease, non-occlusive 10/18/2021   Adenomatous colon polyp 2018   Breast cancer in female [facility]) 02/2013   s/p Procedure(s): LAPAROSCOPIC RIGHT HEMI COLECTOMY 05/18/2022  -    Tubular adenoma with focal high-grade dysplasia, completely  resected.  -    Proximal and distal resection margins negative for adenomatous  change.  -    Regional lymph nodes negative for malignancy.  -    Proximal and distal resection margins viable.  -    Appendix surgically absent.   -Comfortable with and stable for discharge home today -Ambulate 5x/day -Ppx: SQH, SCDs   LOS: 7 days   Nadeen Landau, MD [facility], [place]

## 2022-05-25 NOTE — Plan of Care (Signed)
  Problem: Education: Goal: Understanding of discharge needs will improve Outcome: Adequate for Discharge Goal: Verbalization of understanding of the causes of altered bowel function will improve Outcome: Adequate for Discharge   Problem: Activity: Goal: Ability to tolerate increased activity will improve Outcome: Adequate for Discharge   Problem: Bowel/Gastric: Goal: Gastrointestinal status for postoperative course will improve Outcome: Adequate for Discharge   Problem: Health Behavior/Discharge Planning: Goal: Identification of community resources to assist with postoperative recovery needs will improve Outcome: Adequate for Discharge   Problem: Nutritional: Goal: Will attain and maintain optimal nutritional status will improve Outcome: Adequate for Discharge   Problem: Clinical Measurements: Goal: Postoperative complications will be avoided or minimized Outcome: Adequate for Discharge   Problem: Respiratory: Goal: Respiratory status will improve Outcome: Adequate for Discharge   Problem: Skin Integrity: Goal: Will show signs of wound healing Outcome: Adequate for Discharge   Problem: Education: Goal: Knowledge of General Education information will improve Description: Including pain rating scale, medication(s)/side effects and non-pharmacologic comfort measures Outcome: Adequate for Discharge   Problem: Health Behavior/Discharge Planning: Goal: Ability to manage health-related needs will improve Outcome: Adequate for Discharge   Problem: Clinical Measurements: Goal: Ability to maintain clinical measurements within normal limits will improve Outcome: Adequate for Discharge Goal: Will remain free from infection Outcome: Adequate for Discharge Goal: Diagnostic test results will improve Outcome: Adequate for Discharge Goal: Respiratory complications will improve Outcome: Adequate for Discharge Goal: Cardiovascular complication will be avoided Outcome: Adequate for  Discharge   Problem: Activity: Goal: Risk for activity intolerance will decrease Outcome: Adequate for Discharge   Problem: Nutrition: Goal: Adequate nutrition will be maintained Outcome: Adequate for Discharge   Problem: Coping: Goal: Level of anxiety will decrease Outcome: Adequate for Discharge   Problem: Elimination: Goal: Will not experience complications related to bowel motility Outcome: Adequate for Discharge Goal: Will not experience complications related to urinary retention Outcome: Adequate for Discharge   Problem: Pain Managment: Goal: General experience of comfort will improve Outcome: Adequate for Discharge   Problem: Safety: Goal: Ability to remain free from injury will improve Outcome: Adequate for Discharge   Problem: Skin Integrity: Goal: Risk for impaired skin integrity will decrease Outcome: Adequate for Discharge   Problem: Education: Goal: Required Educational Video(s) Outcome: Adequate for Discharge   Problem: Clinical Measurements: Goal: Ability to maintain clinical measurements within normal limits will improve Outcome: Adequate for Discharge Goal: Postoperative complications will be avoided or minimized Outcome: Adequate for Discharge   Problem: Skin Integrity: Goal: Demonstration of wound healing without infection will improve Outcome: Adequate for Discharge

## 2022-05-26 ENCOUNTER — Telehealth: Payer: Self-pay

## 2022-05-26 NOTE — Telephone Encounter (Cosign Needed)
Transition Care Management Follow-up Telephone Call Date of discharge and from where: [facility] 05/25/22 How have you been since you were released from the hospital? Doing well Any questions or concerns? No  Items Reviewed: Did the pt receive and understand the discharge instructions provided? Yes  Medications obtained and verified? Yes  Other? No  Any new allergies since your discharge? No  Dietary orders reviewed? Yes Do you have support at home? Yes   Home Care and Equipment/Supplies: Were home health services ordered? not applicable If so, what is the name of the agency?   Has the agency set up a time to come to the patient's home? not applicable Were any new equipment or medical supplies ordered?  No What is the name of the medical supply agency?   Were you able to get the supplies/equipment? not applicable Do you have any questions related to the use of the equipment or supplies? No  Functional Questionnaire: (I = Independent and D = Dependent) ADLs: I  Bathing/Dressing- I  Meal Prep- I  Eating- I  Maintaining continence- I  Transferring/Ambulation- I  Managing Meds- I  Follow up appointments reviewed:  PCP Hospital f/u appt confirmed? No  Specialist Hospital f/u appt confirmed? No   Are transportation arrangements needed? No  If their condition worsens, is the pt aware to call PCP or go to the Emergency Dept.? Yes Was the patient provided with contact information for the PCP's office or ED? Yes Was to pt encouraged to call back with questions or concerns? Yes

## 2022-06-01 NOTE — Telephone Encounter (Signed)
Noted - discharged 2/2 surgery; no need for f/up with me at this time.

## 2022-06-09 ENCOUNTER — Other Ambulatory Visit: Payer: Self-pay | Admitting: Physician Assistant

## 2022-06-09 ENCOUNTER — Ambulatory Visit
Admission: RE | Admit: 2022-06-09 | Discharge: 2022-06-09 | Disposition: A | Discharge status: Home / Self Care | Payer: Medicare Other | Source: Ambulatory Visit | Attending: Physician Assistant | Admitting: Physician Assistant

## 2022-06-09 DIAGNOSIS — M8580 Other specified disorders of bone density and structure, unspecified site: Secondary | ICD-10-CM

## 2022-06-09 DIAGNOSIS — Z1231 Encounter for screening mammogram for malignant neoplasm of breast: Secondary | ICD-10-CM

## 2022-06-09 DIAGNOSIS — Z780 Asymptomatic menopausal state: Secondary | ICD-10-CM | POA: Diagnosis not present

## 2022-06-09 DIAGNOSIS — M85851 Other specified disorders of bone density and structure, right thigh: Secondary | ICD-10-CM | POA: Diagnosis not present

## 2022-06-09 HISTORY — DX: Malignant neoplasm of unspecified site of unspecified female breast: C50.919

## 2022-06-20 ENCOUNTER — Encounter: Payer: Self-pay | Admitting: Physician Assistant

## 2022-06-20 ENCOUNTER — Ambulatory Visit: Payer: Medicare Other | Admitting: Physician Assistant

## 2022-06-20 VITALS — BP 116/76 | HR 86 | Ht 66.0 in | Wt 137.4 lb

## 2022-06-20 DIAGNOSIS — I2510 Atherosclerotic heart disease of native coronary artery without angina pectoris: Secondary | ICD-10-CM

## 2022-06-20 DIAGNOSIS — E785 Hyperlipidemia, unspecified: Secondary | ICD-10-CM

## 2022-06-20 DIAGNOSIS — I10 Essential (primary) hypertension: Secondary | ICD-10-CM | POA: Diagnosis not present

## 2022-06-20 NOTE — Progress Notes (Unsigned)
Cardiology Office Note:    Date:  06/22/2022   ID:  Caitlin Parks, DOB 08-17-1946, MRN [id number]  PCP:  Allwardt, Randa Evens, [place] Providers Cardiologist:  Glenetta Hew, MD     Referring MD: Allwardt, Randa Evens, PA-C   Chief Complaint  Patient presents with   Follow-up    Seen for Dr. Ellyn Hack    History of Present Illness:    Caitlin Parks is a 76 y.o. female with a hx of hypertension, hyperlipidemia, and history of CAD.  She moved from [place] to [place] in July 2022.  Previous cardiologist was Dr. Salome Holmes.  Patient previously had a cardiac catheterization in [place] around 2021 that showed mild nonobstructive CAD with 50% D1 lesion.  Patient was most recently seen by Dr. Ellyn Hack in March 2023 at which time she was doing well.  More recently, patient underwent a laparoscopic right hemicolectomy surgery by Dr. Dema Severin on 05/18/2022.  Postop, patient developed some melena which was self-limiting.  Hemoglobin remained stable.  Patient also had postop ileus that took 3 to 4 days to resolve.  Patient presents today for evaluation of abnormal EKG.  She was told by preop staff in the hospital that the previous EKG obtained in November 2022 showed a possible heart attack.  EKG at the time read as "cannot rule out anterior infarct" due to poor R wave progression in anterior leads.  I explained to the patient that this is a nonspecific finding.  The most recent EKG obtained in the hospital prior to surgery in June 2023 however showed T wave inversion throughout the entire anterior leads.  Talking with the patient, she denies any exertional chest discomfort or worsening dyspnea.  She has no anginal symptoms.  We repeated the EKG in the clinic which showed normal sinus rhythm, no significant ST-T wave changes.  Given lack of symptom, we decided to hold off on additional evaluation.  She can follow-up with Dr. Ellyn Hack as previously recommended in  November.  Past Medical History:  Diagnosis Date   Adenomatous colon polyp 2018   Allergy    SEASONAL   Breast cancer ([facility])    Breast cancer in female [facility]) 02/2013   Right-sided breast cancer-s/p right mastectomy.  No chemotherapy or radiation.   Essential hypertension    Family history of breast cancer    Family history of colon cancer    GERD (gastroesophageal reflux disease)    Hyperlipidemia LDL goal <100    Documented coronary calcification with mild CAD.   Leukopenia    Neutropenia (HCC)    Pneumonia    Seasonal allergies     Past Surgical History:  Procedure Laterality Date   AUGMENTATION MAMMAPLASTY     COLONOSCOPY     LAPAROSCOPIC RIGHT HEMI COLECTOMY Right 05/18/2022   Procedure: LAPAROSCOPIC RIGHT HEMI COLECTOMY;  Surgeon: Ileana Roup, MD;  Location: [facility];  Service: General;  Laterality: Right;   LEFT HEART CATH AND CORONARY ANGIOGRAPHY  07/14/2020   [place], [place], [place] : no flow-limiting lesions.  Proximal LAD calcified lesion: FFR 0.95.  D1 to Strader bifurcation 50% stenosis.  Normal RCA and LCx.   MASTECTOMY Right 02/04/2013   NM MYOVIEW LTD  07/2020   Read as is "abnormal"-referred for cath that was normal.   PLACEMENT OF BREAST IMPLANTS Right 08/2013   POLYPECTOMY     TRANSTHORACIC ECHOCARDIOGRAM  2021   ([place], [place]) normal EF with no RWMA.   TUBAL LIGATION  1973   Reversed in 1981   Tubal ligation reversal  1981    Current Medications: Current Meds  Medication Sig   Ascorbic Acid (VITAMIN C) 500 MG CAPS Take 500 mg by mouth in the morning.   aspirin 81 MG EC tablet Take 1 tablet (81 mg total) by mouth daily.   Cholecalciferol (VITAMIN D3) 50 MCG (2000 UT) TABS Take 2,000 Units by mouth in the morning.   ciclopirox (PENLAC) 8 % solution Apply topically at bedtime. Apply over nail and surrounding skin. Apply daily over previous coat. After seven (7) days, may remove with alcohol and continue cycle.   ezetimibe (ZETIA) 10  MG tablet Take 1 tablet (10 mg total) by mouth at bedtime.   fexofenadine (ALLEGRA) 180 MG tablet Take 180 mg by mouth daily as needed (allergies.).   fluticasone (FLONASE) 50 MCG/ACT nasal spray Place 1-2 sprays into both nostrils daily as needed for allergies.   losartan (COZAAR) 50 MG tablet Take 1 tablet (50 mg total) by mouth daily. (Patient taking differently: Take 50 mg by mouth every evening.)   metoprolol tartrate (LOPRESSOR) 25 MG tablet Take 1 tablet (25 mg total) by mouth 2 (two) times daily.   Multiple Vitamins-Minerals (CENTRUM SILVER PO) Take 1 tablet by mouth in the morning.   pantoprazole (PROTONIX) 20 MG tablet Take 20 mg by mouth daily as needed for heartburn or indigestion.   Probiotic Product (PROBIOTIC PO) Take 1 capsule by mouth in the morning.   rosuvastatin (CRESTOR) 10 MG tablet Take 1 tablet (10 mg total) by mouth daily. (Patient taking differently: Take 10 mg by mouth every evening.)   vitamin B-12 (CYANOCOBALAMIN) 1000 MCG tablet Take 1,000 mcg by mouth in the morning.   Wheat Dextrin (BENEFIBER DRINK MIX) PACK Take 1 packet by mouth in the morning and at bedtime.     Allergies:   E.e.s. [erythromycin] and Vibramycin [doxycycline]   Social History   Socioeconomic History   Marital status: Widowed    Spouse name: Not on file   Number of children: 3   Years of education: Not on file   Highest education level: Not on file  Occupational History   Not on file  Tobacco Use   Smoking status: Never    Passive exposure: Past   Smokeless tobacco: Never  Vaping Use   Vaping Use: Never used  Substance and Sexual Activity   Alcohol use: Yes    Alcohol/week: 1.0 standard drink of alcohol    Types: 1 Glasses of wine per week    Comment: 2 GLASSES PER WEEK   Drug use: Never   Sexual activity: Not Currently  Other Topics Concern   Not on file  Social History Narrative   Widowed mother of 3 (1 son, 2 daughters ->    1 daughter lives here in [place] area along  with her granddaughter, other daughter lives in [place] -[place] when she left [place], she first went to [place], but Subsequently Moved Back E. and Further S. to [place] to be closer to her granddaughter and daughter here locally.  Son also lives relatively close.      Caffeine: 2 servings daily-1 coffee, 1 herbal tea.      Diet: Minimizes red meat, eats lots of fruits and vegetables, chicken and fish.  Drinks lots of water (almost 80 ounces).      Exercise: Walks routinely 5 days a week, and started going to a dance class and exercise class.   Social Determinants  of Health   Financial Resource Strain: Low Risk  (03/29/2022)   Overall Financial Resource Strain (CARDIA)    Difficulty of Paying Living Expenses: Not hard at all  Food Insecurity: No Food Insecurity (03/29/2022)   Hunger Vital Sign    Worried About Running Out of Food in the Last Year: Never true    Ran Out of Food in the Last Year: Never true  Transportation Needs: No Transportation Needs (03/29/2022)   PRAPARE - Hydrologist (Medical): No    Lack of Transportation (Non-Medical): No  Physical Activity: Sufficiently Active (03/29/2022)   Exercise Vital Sign    Days of Exercise per Week: 4 days    Minutes of Exercise per Session: 60 min  Stress: No Stress Concern Present (03/29/2022)   [place]    Feeling of Stress : Not at all  Social Connections: Moderately Integrated (03/29/2022)   Social Connection and Isolation Panel [NHANES]    Frequency of Communication with Friends and Family: More than three times a week    Frequency of Social Gatherings with Friends and Family: More than three times a week    Attends Religious Services: More than 4 times per year    Active Member of [employer] or Organizations: Yes    Attends Archivist Meetings: 1 to 4 times per year    Marital Status: Widowed     Family History: The patient's  family history includes Alzheimer's disease (age of onset: 12) in her maternal grandmother; Breast cancer in an other family member; Cancer in her maternal uncle and paternal aunt; Cancer - Colon in her paternal grandmother; Colon cancer in her paternal grandmother; Colon cancer (age of onset: 47) in her mother; Diabetes Mellitus II in her paternal grandfather; Lung cancer in her maternal grandfather. There is no history of Esophageal cancer, Rectal cancer, or Stomach cancer.  ROS:   Please see the history of present illness.     All other systems reviewed and are negative.  EKGs/Labs/Other Studies Reviewed:    The following studies were reviewed today:  N/A  EKG:  EKG is ordered today.  The ekg ordered today demonstrates normal sinus rhythm, no significant ST-T wave changes.  Recent Labs: 07/16/2021: B Natriuretic Peptide 52.9 01/19/2022: ALT 31; TSH 0.971 05/23/2022: BUN 11; Creatinine, Ser 0.67; Hemoglobin 10.4; Platelets 154; Potassium 3.9; Sodium 139  Recent Lipid Panel No results found for: "CHOL", "TRIG", "HDL", "CHOLHDL", "VLDL", "LDLCALC", "LDLDIRECT"   Risk Assessment/Calculations:          Physical Exam:    VS:  BP 116/76   Pulse 86   Ht '5\' 6"'$  (1.676 m)   Wt 137 lb 6.4 oz (62.3 kg)   SpO2 96%   BMI 22.18 kg/m        Wt Readings from Last 3 Encounters:  06/20/22 137 lb 6.4 oz (62.3 kg)  05/25/22 168 lb 10.4 oz (76.5 kg)  05/10/22 147 lb 11.3 oz (67 kg)     GEN:  Well nourished, well developed in no acute distress HEENT: Normal NECK: No JVD; No carotid bruits LYMPHATICS: No lymphadenopathy CARDIAC: RRR, no murmurs, rubs, gallops RESPIRATORY:  Clear to auscultation without rales, wheezing or rhonchi  ABDOMEN: Soft, non-tender, non-distended MUSCULOSKELETAL:  No edema; No deformity  SKIN: Warm and dry NEUROLOGIC:  Alert and oriented x 3 PSYCHIATRIC:  Normal affect   ASSESSMENT:    1. Coronary artery disease involving native coronary artery  of native  heart without angina pectoris   2. Essential hypertension   3. Hyperlipidemia LDL goal <70    PLAN:    In order of problems listed above:  CAD: Patient denies any recent chest discomfort.  She was told prior to the recent surgery that the previous EKG showed possible MI, previous EKG was interpreted's cannot rule out anterior infarction due to poor R wave progression.  She has no chest pain.  I explained to the patient that this is a nonspecific finding.  Preop EKG did show T wave inversion in the anterior leads.  We repeated another EKG today which came back normal.  At this time, I do not recommend any further work-up  Hypertension: Blood pressure stable  Hyperlipidemia: On Crestor           Medication Adjustments/Labs and Tests Ordered: Current medicines are reviewed at length with the patient today.  Concerns regarding medicines are outlined above.  Orders Placed This Encounter  Procedures   EKG 12-Lead   No orders of the defined types were placed in this encounter.   Patient Instructions  Medication Instructions:  Your physician recommends that you continue on your current medications as directed. Please refer to the Current Medication list given to you today.  *If you need a refill on your cardiac medications before your next appointment, please call your pharmacy*   Lab Work: NONE If you have labs (blood work) drawn today and your tests are completely normal, you will receive your results only by: [place] (if you have MyChart) OR A paper copy in the mail If you have any lab test that is abnormal or we need to change your treatment, we will call you to review the results.   Testing/Procedures: NONE   Follow-Up: At [facility], you and your health needs are our priority.  As part of our continuing mission to provide you with exceptional heart care, we have created designated Provider Care Teams.  These Care Teams include your primary Cardiologist  (physician) and Advanced Practice Providers (APPs -  Physician Assistants and Nurse Practitioners) who all work together to provide you with the care you need, when you need it.   Other Instructions You have a recall in the system for an upcoming follow up with Dr. Ellyn Hack Please be sure to schedule that appointment when you receive your letter.    Hilbert Corrigan, [place]  06/22/2022 12:25 PM    [place]

## 2022-06-20 NOTE — Patient Instructions (Signed)
Medication Instructions:  Your physician recommends that you continue on your current medications as directed. Please refer to the Current Medication list given to you today.  *If you need a refill on your cardiac medications before your next appointment, please call your pharmacy*   Lab Work: NONE If you have labs (blood work) drawn today and your tests are completely normal, you will receive your results only by: [place] (if you have MyChart) OR A paper copy in the mail If you have any lab test that is abnormal or we need to change your treatment, we will call you to review the results.   Testing/Procedures: NONE   Follow-Up: At [facility], you and your health needs are our priority.  As part of our continuing mission to provide you with exceptional heart care, we have created designated Provider Care Teams.  These Care Teams include your primary Cardiologist (physician) and Advanced Practice Providers (APPs -  Physician Assistants and Nurse Practitioners) who all work together to provide you with the care you need, when you need it.   Other Instructions You have a recall in the system for an upcoming follow up with Dr. Ellyn Hack Please be sure to schedule that appointment when you receive your letter.

## 2022-06-22 ENCOUNTER — Encounter: Payer: Self-pay | Admitting: Physician Assistant

## 2022-07-15 DIAGNOSIS — H524 Presbyopia: Secondary | ICD-10-CM | POA: Diagnosis not present

## 2022-07-19 ENCOUNTER — Encounter: Payer: Self-pay | Admitting: Nurse Practitioner

## 2022-07-19 ENCOUNTER — Telehealth: Payer: Self-pay | Admitting: Oncology

## 2022-07-19 ENCOUNTER — Inpatient Hospital Stay: Payer: Medicare Other | Admitting: Nurse Practitioner

## 2022-07-19 ENCOUNTER — Inpatient Hospital Stay: Payer: Medicare Other | Attending: Oncology

## 2022-07-19 VITALS — BP 133/73 | HR 62 | Temp 98.1°F | Resp 20 | Ht 66.0 in | Wt 139.2 lb

## 2022-07-19 DIAGNOSIS — C50919 Malignant neoplasm of unspecified site of unspecified female breast: Secondary | ICD-10-CM

## 2022-07-19 DIAGNOSIS — D7589 Other specified diseases of blood and blood-forming organs: Secondary | ICD-10-CM | POA: Insufficient documentation

## 2022-07-19 DIAGNOSIS — I10 Essential (primary) hypertension: Secondary | ICD-10-CM | POA: Diagnosis not present

## 2022-07-19 DIAGNOSIS — I2510 Atherosclerotic heart disease of native coronary artery without angina pectoris: Secondary | ICD-10-CM | POA: Insufficient documentation

## 2022-07-19 DIAGNOSIS — M8580 Other specified disorders of bone density and structure, unspecified site: Secondary | ICD-10-CM | POA: Diagnosis not present

## 2022-07-19 DIAGNOSIS — Z853 Personal history of malignant neoplasm of breast: Secondary | ICD-10-CM | POA: Diagnosis not present

## 2022-07-19 DIAGNOSIS — D72819 Decreased white blood cell count, unspecified: Secondary | ICD-10-CM

## 2022-07-19 DIAGNOSIS — D709 Neutropenia, unspecified: Secondary | ICD-10-CM | POA: Diagnosis not present

## 2022-07-19 LAB — CBC WITH DIFFERENTIAL (CANCER CENTER ONLY)
Abs Immature Granulocytes: 0.02 10*3/uL (ref 0.00–0.07)
Basophils Absolute: 0 10*3/uL (ref 0.0–0.1)
Basophils Relative: 0 %
Eosinophils Absolute: 0 10*3/uL (ref 0.0–0.5)
Eosinophils Relative: 1 %
HCT: 35.6 % — ABNORMAL LOW (ref 36.0–46.0)
Hemoglobin: 11.9 g/dL — ABNORMAL LOW (ref 12.0–15.0)
Immature Granulocytes: 1 %
Lymphocytes Relative: 42 %
Lymphs Abs: 1.4 10*3/uL (ref 0.7–4.0)
MCH: 35.3 pg — ABNORMAL HIGH (ref 26.0–34.0)
MCHC: 33.4 g/dL (ref 30.0–36.0)
MCV: 105.6 fL — ABNORMAL HIGH (ref 80.0–100.0)
Monocytes Absolute: 0.3 10*3/uL (ref 0.1–1.0)
Monocytes Relative: 10 %
Neutro Abs: 1.5 10*3/uL — ABNORMAL LOW (ref 1.7–7.7)
Neutrophils Relative %: 46 %
Platelet Count: 190 10*3/uL (ref 150–400)
RBC: 3.37 MIL/uL — ABNORMAL LOW (ref 3.87–5.11)
RDW: 13.1 % (ref 11.5–15.5)
WBC Count: 3.3 10*3/uL — ABNORMAL LOW (ref 4.0–10.5)
nRBC: 0 % (ref 0.0–0.2)

## 2022-07-19 LAB — DIRECT ANTIGLOBULIN TEST (NOT AT ARMC)
DAT, IgG: NEGATIVE
DAT, complement: NEGATIVE

## 2022-07-19 NOTE — Progress Notes (Signed)
  [place] OFFICE PROGRESS NOTE   Diagnosis: History of breast cancer, leukopenia, red cell macrocytosis  INTERVAL HISTORY:   Caitlin Parks returns for follow-up.  She underwent laparoscopic right hemicolectomy 05/18/2022 due to an endoscopically unresectable colon polyp involving the ileocecal valve.  She reports energy has been slow to return following surgery.  Bowels moving regularly.  She denies bleeding.  No change over the chest wall.  Objective:  Vital signs in last 24 hours:  Blood pressure 133/73, pulse 62, temperature 98.1 F (36.7 C), temperature source Oral, resp. rate 20, height '5\' 6"'$  (1.676 m), weight 139 lb 3.2 oz (63.1 kg), SpO2 100 %.    Lymphatics: No palpable cervical, supraclavicular, axillary or inguinal lymph nodes. Resp: Lungs clear bilaterally. Cardio: Regular rate and rhythm. GI: Abdomen soft and nontender.  No hepatosplenomegaly. Vascular: No leg edema. Neuro: Alert and oriented. Breast: Status post right mastectomy with an implant in place.  No evidence for chest wall tumor recurrence.  Left breast without mass.   Lab Results:  Lab Results  Component Value Date   WBC 3.3 (L) 07/19/2022   HGB 11.9 (L) 07/19/2022   HCT 35.6 (L) 07/19/2022   MCV 105.6 (H) 07/19/2022   PLT 190 07/19/2022   NEUTROABS 1.5 (L) 07/19/2022    Imaging:  No results found.  Medications: I have reviewed the patient's current medications.  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   Right breast cancer 12/17/2012-diagnostic mammogram with right breast ultrasound, 3.3 x 3.9 cm area of malignant linear calcifications upper outer quadrant 01/11/2013-right breast ear tactic biopsy, DCIS, grade 3, ER positive (5), PR negative 01/24/2013-MRI bilateral breasts-clumped enhancement surrounding the marking clip in the right breast measuring 4.5 x 2.1 x 1.2 cm, left breast negative, axillae  negative 02/14/2013-right breast skin sparing mastectomy, DCIS, 1.4 cm, negative margins, negative sentinel lymph node 08/24/2013-exchange of right tissue expander for permanent implant 04/10/2013-May 2019-exemestane 4.  Osteopenia 5.  Hypertension 6.  History of CAD 7.  History of hypercalcemia 8.  History of a colon polyp-tubular adenoma 09/07/2017 9.  Colonoscopy 03/01/2022-four 1 to 2 mm polyps in the transverse colon and in the ascending colon (tubular adenoma in 3 of 6 fragments, benign colonic mucosa in 3 of 6 fragments, no high-grade dysplasia or malignancy); two 2 to 3 mm polyps in the cecum (tubular adenoma 1 of 2 fragments, benign colonic mucosa 1 of 2 fragments, no high-grade dysplasia or malignancy); one 20 mm polyp at the ileocecal valve (focal high-grade dysplasia arising in a tubular adenoma, no invasive carcinoma identified) 10.  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 Negash appears stable.  She remains in clinical remission from breast cancer.  She continues yearly mammography.  We reviewed the CBC from today.  She is mildly anemic, persistent macrocytosis.  The anemia is likely related to the surgery 2 months ago.  She has stable leukopenia/neutropenia.  She will return for a CBC and follow-up visit in 3 months.      Ned Card ANP/GNP-BC   07/19/2022  2:14 PM

## 2022-07-20 NOTE — Telephone Encounter (Signed)
Seen by Ned Card

## 2022-08-29 ENCOUNTER — Encounter: Payer: Self-pay | Admitting: *Deleted

## 2022-08-29 NOTE — Telephone Encounter (Signed)
Please advise: Is new RSV vaccine needed?      Quoted text from appointment request.  Appointment Request From: Elenor Quinones   With Provider: Randa Evens Allwardt, PA-C [[facility]]   Preferred Date Range: 09/02/2022 - 09/05/2022   Preferred Times: Any Time   Reason for visit: Flu Shot   Comments: Flu shot for senior Is new RSV vaccine needed

## 2022-09-02 ENCOUNTER — Ambulatory Visit: Payer: Medicare Other

## 2022-09-22 ENCOUNTER — Encounter: Payer: Self-pay | Admitting: Physician Assistant

## 2022-09-22 ENCOUNTER — Ambulatory Visit (INDEPENDENT_AMBULATORY_CARE_PROVIDER_SITE_OTHER): Payer: Medicare Other | Admitting: Physician Assistant

## 2022-09-22 VITALS — BP 114/74 | HR 70 | Temp 97.5°F | Ht 66.0 in | Wt 138.0 lb

## 2022-09-22 DIAGNOSIS — D126 Benign neoplasm of colon, unspecified: Secondary | ICD-10-CM

## 2022-09-22 DIAGNOSIS — R5381 Other malaise: Secondary | ICD-10-CM | POA: Diagnosis not present

## 2022-09-22 DIAGNOSIS — Z131 Encounter for screening for diabetes mellitus: Secondary | ICD-10-CM

## 2022-09-22 LAB — POCT GLYCOSYLATED HEMOGLOBIN (HGB A1C): Hemoglobin A1C: 5.7 % — AB (ref 4.0–5.6)

## 2022-09-22 NOTE — Progress Notes (Signed)
Subjective:    Patient ID: Caitlin Parks, female    DOB: 1946/04/18, 76 y.o.   MRN: [id number]  Chief Complaint  Patient presents with   Follow-up    Pt f/u for dizziness and balance issues; pt is requesting a referral for PT here in this office is fine for PT per patient; had major surgery in June and still feelslike she is recvoering from that at times;    HPI Patient is in today for discussion about post-surgery symptoms. Right sided colon resection due to very large polyp (20 mm at ileocecal valve) in June, this ended up being focal high-grade dysplasia arising in a tubular adenoma. 7 day hospitalization.   Having some dizzy symptoms, off-balance. Doesn't feel like room-spinning. No syncopal episodes or symptoms. Says blood work is UTD and she goes back next month again for repeat labs. Appetite is coming and going. Sleeping well. Not much pain.  Trying to get on the treadmill mostly. Tried line-dancing again, but just physically not there yet. Walking 3-4 times weekly.   She denies any chest pain or shortness of breath.  No headaches.  No vision changes.  No blood in stool or abdominal pain.  No nausea or vomiting.  Past Medical History:  Diagnosis Date   Adenomatous colon polyp 2018   Allergy    SEASONAL   Breast cancer ([place])    Breast cancer in female [facility]) 02/2013   Right-sided breast cancer-s/p right mastectomy.  No chemotherapy or radiation.   Essential hypertension    Family history of breast cancer    Family history of colon cancer    GERD (gastroesophageal reflux disease)    Hyperlipidemia LDL goal <100    Documented coronary calcification with mild CAD.   Leukopenia    Neutropenia (HCC)    Pneumonia    Seasonal allergies     Past Surgical History:  Procedure Laterality Date   AUGMENTATION MAMMAPLASTY     COLONOSCOPY     LAPAROSCOPIC RIGHT HEMI COLECTOMY Right 05/18/2022   Procedure: LAPAROSCOPIC RIGHT HEMI COLECTOMY;  Surgeon: Ileana Roup,  MD;  Location: [facility];  Service: General;  Laterality: Right;   LEFT HEART CATH AND CORONARY ANGIOGRAPHY  07/14/2020   [place], [place], [place] : no flow-limiting lesions.  Proximal LAD calcified lesion: FFR 0.95.  D1 to Strader bifurcation 50% stenosis.  Normal RCA and LCx.   MASTECTOMY Right 02/04/2013   NM MYOVIEW LTD  07/2020   Read as is "abnormal"-referred for cath that was normal.   PLACEMENT OF BREAST IMPLANTS Right 08/2013   POLYPECTOMY     TRANSTHORACIC ECHOCARDIOGRAM  2021   ([place], [place]) normal EF with no RWMA.   TUBAL LIGATION  1973   Reversed in 1981   Tubal ligation reversal  1981    Family History  Problem Relation Age of Onset   Colon cancer Mother 46   Cancer Maternal Uncle        NOS   Cancer Paternal Aunt        2 PAs with unknown cancer   Alzheimer's disease Maternal Grandmother 61   Lung cancer Maternal Grandfather    Colon cancer Paternal Grandmother    Cancer - Colon Paternal Grandmother    Diabetes Mellitus II Paternal Grandfather    Breast cancer Other        MGFs sister   Esophageal cancer Neg Hx    Rectal cancer Neg Hx    Stomach cancer Neg Hx  Social History   Tobacco Use   Smoking status: Never    Passive exposure: Past   Smokeless tobacco: Never  Vaping Use   Vaping Use: Never used  Substance Use Topics   Alcohol use: Yes    Alcohol/week: 1.0 standard drink of alcohol    Types: 1 Glasses of wine per week    Comment: 2 GLASSES PER WEEK   Drug use: Never     Allergies  Allergen Reactions   E.E.S. [Erythromycin] Diarrhea   Vibramycin [Doxycycline] Rash and Other (See Comments)    Review of Systems NEGATIVE UNLESS OTHERWISE INDICATED IN HPI      Objective:     BP 114/74 (BP Location: Left Arm)   Pulse 70   Temp (!) 97.5 F (36.4 C) (Temporal)   Ht '5\' 6"'$  (1.676 m)   Wt 138 lb (62.6 kg)   SpO2 99%   BMI 22.27 kg/m   Wt Readings from Last 3 Encounters:  09/22/22 138 lb (62.6 kg)  07/19/22 139 lb 3.2  oz (63.1 kg)  06/20/22 137 lb 6.4 oz (62.3 kg)    BP Readings from Last 3 Encounters:  09/22/22 114/74  07/19/22 133/73  06/20/22 116/76     Physical Exam Vitals and nursing note reviewed.  Constitutional:      Appearance: Normal appearance. She is normal weight. She is not toxic-appearing.  HENT:     Head: Normocephalic and atraumatic.     Right Ear: Tympanic membrane and external ear normal. There is impacted cerumen.     Left Ear: Tympanic membrane, ear canal and external ear normal.     Nose: Nose normal.     Mouth/Throat:     Mouth: Mucous membranes are moist.  Eyes:     Extraocular Movements: Extraocular movements intact.     Conjunctiva/sclera: Conjunctivae normal.     Pupils: Pupils are equal, round, and reactive to light.  Cardiovascular:     Rate and Rhythm: Normal rate and regular rhythm.     Pulses: Normal pulses.     Heart sounds: Normal heart sounds.  Pulmonary:     Effort: Pulmonary effort is normal.     Breath sounds: Normal breath sounds.  Abdominal:     General: Abdomen is flat. Bowel sounds are normal. There is no distension.     Palpations: Abdomen is soft. There is no mass.     Tenderness: There is no abdominal tenderness.  Musculoskeletal:        General: Normal range of motion.     Cervical back: Normal range of motion and neck supple.     Right lower leg: No edema.     Left lower leg: No edema.  Skin:    General: Skin is warm and dry.     Findings: No rash.  Neurological:     General: No focal deficit present.     Mental Status: She is alert and oriented to person, place, and time.     Cranial Nerves: No cranial nerve deficit.     Motor: No weakness.     Gait: Gait normal.  Psychiatric:        Mood and Affect: Mood normal.        Behavior: Behavior normal.        Thought Content: Thought content normal.        Judgment: Judgment normal.        Assessment & Plan:  Diabetes mellitus screening -     POCT glycosylated hemoglobin (  Hb  A1C)  Physical deconditioning -     Ambulatory referral to Physical Therapy  Adenomatous polyp of colon, unspecified part of colon    76 year old female patient status post right hemicolectomy secondary to very large adenomatous polyp.  She is about 3 to 4 months out from surgery.  Still feeling overall not back to her baseline.  She has been having some feelings of dizzy and off balance.  I did go ahead and check a hemoglobin A1c to make sure no underlying diabetes, this is 5.7% and normal.  Reviewed her recent other blood work and everything looked good there as well.  She may benefit from physical therapy to help with the reconditioning process.  Sent a referral for this today.  Patient to keep me posted how she is doing and if any changes or concerns,.    Return in about 6 months (around 03/24/2023) for regular 6 mo f/up.  This note was prepared with assistance of Systems analyst. Occasional wrong-word or sound-a-like substitutions may have occurred due to the inherent limitations of voice recognition software.  Time Spent: 30 minutes of total time was spent on the date of the encounter performing the following actions: chart review prior to seeing the patient, obtaining history, performing a medically necessary exam, counseling on the treatment plan, placing orders, and documenting in our EHR.       Kelley Polinsky M Kinzee Happel, PA-C

## 2022-10-19 ENCOUNTER — Inpatient Hospital Stay: Payer: Medicare Other | Attending: Oncology

## 2022-10-19 ENCOUNTER — Other Ambulatory Visit (HOSPITAL_BASED_OUTPATIENT_CLINIC_OR_DEPARTMENT_OTHER): Payer: Self-pay

## 2022-10-19 ENCOUNTER — Encounter: Payer: Self-pay | Admitting: Nurse Practitioner

## 2022-10-19 ENCOUNTER — Inpatient Hospital Stay: Payer: Medicare Other | Admitting: Nurse Practitioner

## 2022-10-19 VITALS — BP 138/65 | HR 66 | Temp 98.1°F | Resp 20 | Ht 66.0 in | Wt 140.2 lb

## 2022-10-19 DIAGNOSIS — M8580 Other specified disorders of bone density and structure, unspecified site: Secondary | ICD-10-CM | POA: Diagnosis not present

## 2022-10-19 DIAGNOSIS — D7589 Other specified diseases of blood and blood-forming organs: Secondary | ICD-10-CM

## 2022-10-19 DIAGNOSIS — C50919 Malignant neoplasm of unspecified site of unspecified female breast: Secondary | ICD-10-CM

## 2022-10-19 DIAGNOSIS — D709 Neutropenia, unspecified: Secondary | ICD-10-CM | POA: Diagnosis not present

## 2022-10-19 DIAGNOSIS — I2510 Atherosclerotic heart disease of native coronary artery without angina pectoris: Secondary | ICD-10-CM | POA: Diagnosis not present

## 2022-10-19 DIAGNOSIS — I10 Essential (primary) hypertension: Secondary | ICD-10-CM | POA: Diagnosis not present

## 2022-10-19 DIAGNOSIS — D72819 Decreased white blood cell count, unspecified: Secondary | ICD-10-CM

## 2022-10-19 DIAGNOSIS — Z853 Personal history of malignant neoplasm of breast: Secondary | ICD-10-CM | POA: Insufficient documentation

## 2022-10-19 LAB — CBC WITH DIFFERENTIAL (CANCER CENTER ONLY)
Abs Immature Granulocytes: 0 10*3/uL (ref 0.00–0.07)
Basophils Absolute: 0 10*3/uL (ref 0.0–0.1)
Basophils Relative: 0 %
Eosinophils Absolute: 0 10*3/uL (ref 0.0–0.5)
Eosinophils Relative: 1 %
HCT: 37.7 % (ref 36.0–46.0)
Hemoglobin: 12.7 g/dL (ref 12.0–15.0)
Immature Granulocytes: 0 %
Lymphocytes Relative: 41 %
Lymphs Abs: 1.1 10*3/uL (ref 0.7–4.0)
MCH: 35 pg — ABNORMAL HIGH (ref 26.0–34.0)
MCHC: 33.7 g/dL (ref 30.0–36.0)
MCV: 103.9 fL — ABNORMAL HIGH (ref 80.0–100.0)
Monocytes Absolute: 0.2 10*3/uL (ref 0.1–1.0)
Monocytes Relative: 8 %
Neutro Abs: 1.3 10*3/uL — ABNORMAL LOW (ref 1.7–7.7)
Neutrophils Relative %: 50 %
Platelet Count: 192 10*3/uL (ref 150–400)
RBC: 3.63 MIL/uL — ABNORMAL LOW (ref 3.87–5.11)
RDW: 13.5 % (ref 11.5–15.5)
WBC Count: 2.6 10*3/uL — ABNORMAL LOW (ref 4.0–10.5)
nRBC: 0 % (ref 0.0–0.2)

## 2022-10-19 MED ORDER — AREXVY 120 MCG/0.5ML IM SUSR
INTRAMUSCULAR | 0 refills | Status: DC
  Filled 2022-10-19: qty 0.5, 1d supply, fill #0

## 2022-10-19 NOTE — Progress Notes (Signed)
  [place] OFFICE PROGRESS NOTE   Diagnosis: Breast cancer, leukopenia, red cell macrocytosis  INTERVAL HISTORY:   Caitlin Parks returns as scheduled.  Overall feeling well.  Energy level is better.  She is walking 3-4 times a week for exercise.  She denies bleeding.  No recent infection.  No change over the chest wall.  Objective:  Vital signs in last 24 hours:  Blood pressure 138/65, pulse 66, temperature 98.1 F (36.7 C), temperature source Oral, resp. rate 20, height '5\' 6"'$  (1.676 m), weight 140 lb 3.2 oz (63.6 kg), SpO2 100 %.    Lymphatics: No palpable cervical, supraclavicular or axillary lymph nodes. Resp: Lungs clear bilaterally. Cardio: Regular rate and rhythm. GI: No hepatosplenomegaly. Vascular: No leg edema. Breast: Status post right mastectomy with an implant in place.  No evidence for chest wall tumor recurrence.  Left breast without mass.  Lab Results:  Lab Results  Component Value Date   WBC 2.6 (L) 10/19/2022   HGB 12.7 10/19/2022   HCT 37.7 10/19/2022   MCV 103.9 (H) 10/19/2022   PLT 192 10/19/2022   NEUTROABS 1.3 (L) 10/19/2022    Imaging:  No results found.  Medications: I have reviewed the patient's current medications.  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   Right breast cancer 12/17/2012-diagnostic mammogram with right breast ultrasound, 3.3 x 3.9 cm area of malignant linear calcifications upper outer quadrant 01/11/2013-right breast ear tactic biopsy, DCIS, grade 3, ER positive (5), PR negative 01/24/2013-MRI bilateral breasts-clumped enhancement surrounding the marking clip in the right breast measuring 4.5 x 2.1 x 1.2 cm, left breast negative, axillae negative 02/14/2013-right breast skin sparing mastectomy, DCIS, 1.4 cm, negative margins, negative sentinel lymph node 08/24/2013-exchange of right tissue expander for permanent implant 04/10/2013-May  2019-exemestane 4.  Osteopenia 5.  Hypertension 6.  History of CAD 7.  History of hypercalcemia 8.  History of a colon polyp-tubular adenoma 09/07/2017 9.  Colonoscopy 03/01/2022-four 1 to 2 mm polyps in the transverse colon and in the ascending colon (tubular adenoma in 3 of 6 fragments, benign colonic mucosa in 3 of 6 fragments, no high-grade dysplasia or malignancy); two 2 to 3 mm polyps in the cecum (tubular adenoma 1 of 2 fragments, benign colonic mucosa 1 of 2 fragments, no high-grade dysplasia or malignancy); one 20 mm polyp at the ileocecal valve (focal high-grade dysplasia arising in a tubular adenoma, no invasive carcinoma identified) 10.  05/18/2022 laparoscopic right hemicolectomy-tubular adenoma with focal high-grade dysplasia completely resected, proximal and distal resection margins negative for adenomatous change, regional lymph nodes negative for malignancy  Disposition: Caitlin Parks appears stable.  Hemoglobin has corrected into normal range.  She has persistent red cell macrocytosis, stable leukopenia/neutropenia.  She remains in clinical remission from breast cancer.  She continues annual mammography.  She will return for a CBC and follow-up visit in 6 months.  We are available to see her sooner if needed.  Ned Card ANP/GNP-BC   10/19/2022  11:44 AM

## 2022-10-28 ENCOUNTER — Telehealth: Payer: Medicare Other | Admitting: Family Medicine

## 2022-10-28 DIAGNOSIS — B9689 Other specified bacterial agents as the cause of diseases classified elsewhere: Secondary | ICD-10-CM

## 2022-10-28 DIAGNOSIS — J40 Bronchitis, not specified as acute or chronic: Secondary | ICD-10-CM

## 2022-10-28 DIAGNOSIS — J019 Acute sinusitis, unspecified: Secondary | ICD-10-CM

## 2022-10-28 MED ORDER — BENZONATATE 200 MG PO CAPS: 200.0000 mg | ORAL_CAPSULE | Freq: Two times a day (BID) | ORAL | 0 refills | Status: DC | PRN

## 2022-10-28 MED ORDER — AMOXICILLIN-POT CLAVULANATE 875-125 MG PO TABS: 1.0000 | ORAL_TABLET | Freq: Two times a day (BID) | ORAL | 0 refills | Status: DC

## 2022-10-28 NOTE — Progress Notes (Signed)
Virtual Visit Consent   Caitlin Parks, you are scheduled for a virtual visit with a [facility] provider today. Just as with appointments in the office, your consent must be obtained to participate. Your consent will be active for this visit and any virtual visit you may have with one of our providers in the next 365 days. If you have a MyChart account, a copy of this consent can be sent to you electronically.  As this is a virtual visit, video technology does not allow for your provider to perform a traditional examination. This may limit your provider's ability to fully assess your condition. If your provider identifies any concerns that need to be evaluated in person or the need to arrange testing (such as labs, EKG, etc.), we will make arrangements to do so. Although advances in technology are sophisticated, we cannot ensure that it will always work on either your end or our end. If the connection with a video visit is poor, the visit may have to be switched to a telephone visit. With either a video or telephone visit, we are not always able to ensure that we have a secure connection.  By engaging in this virtual visit, you consent to the provision of healthcare and authorize for your insurance to be billed (if applicable) for the services provided during this visit. Depending on your insurance coverage, you may receive a charge related to this service.  I need to obtain your verbal consent now. Are you willing to proceed with your visit today? Caitlin Parks has provided verbal consent on 10/28/2022 for a virtual visit (video or telephone). Dellia Nims, FNP  Date: 10/28/2022 12:34 PM  Virtual Visit via Video Note   I, Dellia Nims, connected with  Caitlin Parks  ([id number], 1946/11/15) on 10/28/22 at 12:30 PM EST by a video-enabled telemedicine application and verified that I am speaking with the correct person using two identifiers.  Location: Patient: Virtual Visit Location  Patient: Home Provider: Virtual Visit Location Provider: Home Office   I discussed the limitations of evaluation and management by telemedicine and the availability of in person appointments. The patient expressed understanding and agreed to proceed.    History of Present Illness: Caitlin Parks is a 76 y.o. who identifies as a female who was assigned female at birth, and is being seen today for nasal drainage, sinus pressure, headache, cough keeping her up at night, no wheezing. No sob. No fever. Sx for almost a week worsening. Marland Kitchen  HPI: HPI  Problems:  Patient Active Problem List   Diagnosis Date Noted   S/P right hemicolectomy 05/18/2022   Genetic testing 05/06/2022   Left hip pain 04/20/2022   Family history of breast cancer 04/14/2022   Family history of colon cancer 04/14/2022   Essential hypertension 10/18/2021   Hyperlipidemia with target LDL less than 100 10/18/2021   Coronary artery disease, non-occlusive 10/18/2021   Adenomatous colon polyp 2018   Breast cancer in female [facility]) 02/2013    Allergies:  Allergies  Allergen Reactions   E.E.S. [Erythromycin] Diarrhea   Vibramycin [Doxycycline] Rash and Other (See Comments)   Medications:  Current Outpatient Medications:    amoxicillin-clavulanate (AUGMENTIN) 875-125 MG tablet, Take 1 tablet by mouth 2 (two) times daily., Disp: 20 tablet, Rfl: 0   benzonatate (TESSALON) 200 MG capsule, Take 1 capsule (200 mg total) by mouth 2 (two) times daily as needed for cough., Disp: 20 capsule, Rfl: 0   Ascorbic Acid (VITAMIN C) 500 MG CAPS, Take 500  mg by mouth in the morning., Disp: , Rfl:    aspirin 81 MG EC tablet, Take 1 tablet (81 mg total) by mouth daily., Disp: 30 tablet, Rfl: 11   calcium carbonate (OS-CAL - DOSED IN MG OF ELEMENTAL CALCIUM) 1250 (500 Ca) MG tablet, Take 1 tablet by mouth., Disp: , Rfl:    Cholecalciferol (VITAMIN D3) 50 MCG (2000 UT) TABS, Take 1,000 Units by mouth in the morning., Disp: , Rfl:    ezetimibe  (ZETIA) 10 MG tablet, Take 1 tablet (10 mg total) by mouth at bedtime., Disp: 90 tablet, Rfl: 3   fexofenadine (ALLEGRA) 180 MG tablet, Take 180 mg by mouth daily as needed (allergies.)., Disp: , Rfl:    fluticasone (FLONASE) 50 MCG/ACT nasal spray, Place 1-2 sprays into both nostrils daily as needed for allergies., Disp: , Rfl:    losartan (COZAAR) 50 MG tablet, Take 1 tablet (50 mg total) by mouth daily. (Patient taking differently: Take 50 mg by mouth every evening.), Disp: 90 tablet, Rfl: 3   metoprolol tartrate (LOPRESSOR) 25 MG tablet, Take 1 tablet (25 mg total) by mouth 2 (two) times daily., Disp: 180 tablet, Rfl: 3   Multiple Vitamins-Minerals (CENTRUM SILVER PO), Take 1 tablet by mouth in the morning., Disp: , Rfl:    pantoprazole (PROTONIX) 20 MG tablet, Take 20 mg by mouth daily as needed for heartburn or indigestion., Disp: , Rfl:    Probiotic Product (PROBIOTIC PO), Take 1 capsule by mouth in the morning., Disp: , Rfl:    rosuvastatin (CRESTOR) 10 MG tablet, Take 1 tablet (10 mg total) by mouth daily. (Patient taking differently: Take 10 mg by mouth every evening.), Disp: 90 tablet, Rfl: 3   RSV vaccine recomb adjuvanted (AREXVY) 120 MCG/0.5ML injection, Inject into the muscle., Disp: 0.5 mL, Rfl: 0   vitamin B-12 (CYANOCOBALAMIN) 1000 MCG tablet, Take 1,000 mcg by mouth in the morning., Disp: , Rfl:    Wheat Dextrin (BENEFIBER DRINK MIX) PACK, Take 1 packet by mouth in the morning and at bedtime., Disp: , Rfl:   Observations/Objective: Patient is well-developed, well-nourished in no acute distress.  Resting comfortably  at  friends home.  Head is normocephalic, atraumatic.  No labored breathing.  Speech is clear and coherent with logical content.  Patient is alert and oriented at baseline.    Assessment and Plan: 1. Acute bacterial sinusitis  2. Bronchitis  Increase fluids, continue mucinex, tylenol or ibuprofen as directed, urgent care if sx persist or worsen.   Follow Up  Instructions: I discussed the assessment and treatment plan with the patient. The patient was provided an opportunity to ask questions and all were answered. The patient agreed with the plan and demonstrated an understanding of the instructions.  A copy of instructions were sent to the patient via MyChart unless otherwise noted below.     The patient was advised to call back or seek an in-person evaluation if the symptoms worsen or if the condition fails to improve as anticipated.  Time:  I spent 10 minutes with the patient via telehealth technology discussing the above problems/concerns.    Dellia Nims, FNP

## 2022-10-28 NOTE — Patient Instructions (Signed)

## 2022-11-15 ENCOUNTER — Ambulatory Visit: Payer: Medicare Other | Admitting: General Practice

## 2022-11-15 ENCOUNTER — Telehealth: Payer: Self-pay | Admitting: Cardiology

## 2022-11-15 MED ORDER — LOSARTAN POTASSIUM 50 MG PO TABS: 50.0000 mg | ORAL_TABLET | Freq: Every day | ORAL | 3 refills | Status: DC

## 2022-11-15 MED ORDER — EZETIMIBE 10 MG PO TABS: 10.0000 mg | ORAL_TABLET | Freq: Every day | ORAL | 3 refills | Status: DC

## 2022-11-15 NOTE — Telephone Encounter (Signed)
 *  STAT* If patient is at the pharmacy, call can be transferred to refill team.   1. Which medications need to be refilled? (please list name of each medication and dose if known)   ezetimibe (ZETIA) 10 MG tablet  losartan (COZAAR) 50 MG tablet   2. Which pharmacy/location (including street and city if local pharmacy) is medication to be sent to? [place], [place]    3. Do they need a 30 day or 90 day supply? 90 days

## 2022-12-09 ENCOUNTER — Telehealth: Payer: Self-pay | Admitting: Cardiology

## 2022-12-09 MED ORDER — ROSUVASTATIN CALCIUM 10 MG PO TABS: 10.0000 mg | ORAL_TABLET | Freq: Every day | ORAL | 3 refills | Status: DC

## 2022-12-09 MED ORDER — METOPROLOL TARTRATE 25 MG PO TABS: 25.0000 mg | ORAL_TABLET | Freq: Two times a day (BID) | ORAL | 3 refills | Status: DC

## 2022-12-09 NOTE — Telephone Encounter (Signed)
Patient stated she would like to discuss her medication at her next office visit and was told the APP would no be able to prescribe medication.  Patient would like a call back to regarding this.

## 2022-12-09 NOTE — Telephone Encounter (Signed)
Spoke to patient. She just wanted to know if she will be able to have her medication reviewed by the nurse pract.  RN reassured patient that the practitioner would able to  manage that for her until she see Dr Ellyn Hack in June. 2024 She voice understanding.

## 2022-12-09 NOTE — Telephone Encounter (Signed)
*  STAT* If patient is at the pharmacy, call can be transferred to refill team.   1. Which medications need to be refilled? (please list name of each medication and dose if known)   metoprolol tartrate (LOPRESSOR) 25 MG tablet  (has 2 weeks worth of medication left) rosuvastatin (CRESTOR) 10 MG tablet (completely out)  2. Which pharmacy/location (including street and city if local pharmacy) is medication to be sent to?  [place], [place]   3. Do they need a 30 day or 90 day supply?  90 day  Patient stated she is completely out of these medications.

## 2022-12-19 NOTE — Progress Notes (Signed)
Cardiology Clinic Note   Patient Name: Caitlin Parks Date of Encounter: 12/21/2022  Primary Care Provider:  Allwardt, Randa Evens, PA-C Primary Cardiologist:  Glenetta Hew, MD  Patient Profile    Caitlin Parks 77 year old female presents to the clinic today for follow-up evaluation of her essential hypertension and coronary artery disease.  Past Medical History    Past Medical History:  Diagnosis Date   Adenomatous colon polyp 2018   Allergy    SEASONAL   Breast cancer ([place])    Breast cancer in female [facility]) 02/2013   Right-sided breast cancer-s/p right mastectomy.  No chemotherapy or radiation.   Essential hypertension    Family history of breast cancer    Family history of colon cancer    GERD (gastroesophageal reflux disease)    Hyperlipidemia LDL goal <100    Documented coronary calcification with mild CAD.   Leukopenia    Neutropenia (HCC)    Pneumonia    Seasonal allergies    Past Surgical History:  Procedure Laterality Date   AUGMENTATION MAMMAPLASTY     COLONOSCOPY     LAPAROSCOPIC RIGHT HEMI COLECTOMY Right 05/18/2022   Procedure: LAPAROSCOPIC RIGHT HEMI COLECTOMY;  Surgeon: Ileana Roup, MD;  Location: [facility];  Service: General;  Laterality: Right;   LEFT HEART CATH AND CORONARY ANGIOGRAPHY  07/14/2020   [place], [place], [place] : no flow-limiting lesions.  Proximal LAD calcified lesion: FFR 0.95.  D1 to Strader bifurcation 50% stenosis.  Normal RCA and LCx.   MASTECTOMY Right 02/04/2013   NM MYOVIEW LTD  07/2020   Read as is "abnormal"-referred for cath that was normal.   PLACEMENT OF BREAST IMPLANTS Right 08/2013   POLYPECTOMY     TRANSTHORACIC ECHOCARDIOGRAM  2021   ([place], [place]) normal EF with no RWMA.   TUBAL LIGATION  1973   Reversed in 1981   Tubal ligation reversal  1981    Allergies  Allergies  Allergen Reactions   E.E.S. [Erythromycin] Diarrhea   Vibramycin [Doxycycline] Rash and Other (See Comments)     History of Present Illness    Caitlin Parks has a PMH of essential hypertension, coronary artery disease, colon polyp, status post right hemicolectomy, hyperlipidemia, GERD, and neutropenia.  She moved from [place] to [facility] 7/22.  She was previously followed by cardiology (Dr. Leron Croak).  She underwent cardiac catheterization in [place] which showed mild nonobstructive CAD with 50% D1 lesion.  She was seen by Dr. Ellyn Hack 3/23 and was doing well at that time.  She underwent laparoscopic right hemicolectomy by Dr. Dema Severin 05/18/2022.  Postoperatively she developed some melena.  Her hemoglobin remained stable.  She developed postoperative ileus which took 3-4 days to resolve.  She was noted to have an abnormal EKG.  She was seen by Almyra Deforest PA-C 06/20/2022.  She reported that she was told by preop staff 11/22 showed possible heart attack.  EKG at that time was reviewed and showed open "cannot rule out anterior infarct" due to poor R wave progression in anterior leads.  It was explained to the patient that it was a nonspecific finding.  Her June 23 EKG was reviewed and showed T wave inversion throughout the entire anterior leads.  EKG 06/20/2022 showed normal sinus rhythm with no ectopy.  She denied chest discomfort and worsening dyspnea.  No additional evaluation was felt to be warranted at that time.  Follow-up was planned for November.  She presents to the clinic today for follow-up evaluation and states  she feels well.  She tolerated her surgery well in June.  She does note some continued fatigue/decreased endurance postoperatively.  She reports that she is able to walk daily for about 15 minutes with her dog without difficulty.  She does have some difficulty with walking on the treadmill.  She reports some dizziness with changing head positions.  We reviewed her current medications.  Her blood pressure is well-controlled.  Her EKG today shows sinus rhythm with fusion complexes 59  bpm.  I will give her Epley maneuvers, have her maintain her physical activity and current diet.  We will plan follow-up in 9 to 12 months.  Today she denies chest pain, shortness of breath, lower extremity edema, fatigue, palpitations, melena, hematuria, hemoptysis, diaphoresis, weakness, presyncope, syncope, orthopnea, and PND.   Home Medications    Prior to Admission medications   Medication Sig Start Date End Date Taking? Authorizing Provider  amoxicillin-clavulanate (AUGMENTIN) 875-125 MG tablet Take 1 tablet by mouth 2 (two) times daily. 10/28/22   Tempie Hoist, FNP  Ascorbic Acid (VITAMIN C) 500 MG CAPS Take 500 mg by mouth in the morning.    [provider]  aspirin 81 MG EC tablet Take 1 tablet (81 mg total) by mouth daily. 10/18/21   Leonie Man, MD  benzonatate (TESSALON) 200 MG capsule Take 1 capsule (200 mg total) by mouth 2 (two) times daily as needed for cough. 10/28/22   Tempie Hoist, FNP  calcium carbonate (OS-CAL - DOSED IN MG OF ELEMENTAL CALCIUM) 1250 (500 Ca) MG tablet Take 1 tablet by mouth.    [provider]  Cholecalciferol (VITAMIN D3) 50 MCG (2000 UT) TABS Take 1,000 Units by mouth in the morning.    [provider]  ezetimibe (ZETIA) 10 MG tablet Take 1 tablet (10 mg total) by mouth at bedtime. 11/15/22   Leonie Man, MD  fexofenadine (ALLEGRA) 180 MG tablet Take 180 mg by mouth daily as needed (allergies.).    [provider]  fluticasone (FLONASE) 50 MCG/ACT nasal spray Place 1-2 sprays into both nostrils daily as needed for allergies.    [provider]  losartan (COZAAR) 50 MG tablet Take 1 tablet (50 mg total) by mouth daily. 11/15/22   Leonie Man, MD  metoprolol tartrate (LOPRESSOR) 25 MG tablet Take 1 tablet (25 mg total) by mouth 2 (two) times daily. 12/09/22   Leonie Man, MD  Multiple Vitamins-Minerals (CENTRUM SILVER PO) Take 1 tablet by mouth in the morning.    [provider]   pantoprazole (PROTONIX) 20 MG tablet Take 20 mg by mouth daily as needed for heartburn or indigestion.    [provider]  Probiotic Product (PROBIOTIC PO) Take 1 capsule by mouth in the morning.    [provider]  rosuvastatin (CRESTOR) 10 MG tablet Take 1 tablet (10 mg total) by mouth daily. 12/09/22   Leonie Man, MD  RSV vaccine recomb adjuvanted (AREXVY) 120 MCG/0.5ML injection Inject into the muscle. 10/19/22   Carlyle Basques, MD  vitamin B-12 (CYANOCOBALAMIN) 1000 MCG tablet Take 1,000 mcg by mouth in the morning.    [provider]  Wheat Dextrin (BENEFIBER DRINK MIX) PACK Take 1 packet by mouth in the morning and at bedtime.    [provider]  metoprolol tartrate (LOPRESSOR) 25 MG tablet Take 25 mg by mouth 2 (two) times daily.    [provider]    Family History    Family History  Problem Relation Age of Onset   Colon cancer Mother 58   Cancer Maternal Uncle        NOS   Cancer Paternal Aunt        2 PAs with unknown cancer   Alzheimer's disease Maternal Grandmother 71   Lung cancer Maternal Grandfather    Colon cancer Paternal Grandmother    Cancer - Colon Paternal Grandmother    Diabetes Mellitus II Paternal Grandfather    Breast cancer Other        MGFs sister   Esophageal cancer Neg Hx    Rectal cancer Neg Hx    Stomach cancer Neg Hx    She indicated that her mother is deceased. She indicated that her father is deceased. She indicated that her maternal grandmother is deceased. She indicated that her maternal grandfather is deceased. She indicated that her paternal grandmother is deceased. She indicated that her paternal grandfather is deceased. She indicated that her maternal aunt is deceased. She indicated that both of her maternal uncles are deceased. She indicated that her paternal aunt is deceased. She indicated that the status of her paternal uncle is unknown. She indicated that the status of her neg hx is unknown.  She indicated that her other is deceased.  Social History    Social History   Socioeconomic History   Marital status: Widowed    Spouse name: Not on file   Number of children: 3   Years of education: Not on file   Highest education level: Not on file  Occupational History   Not on file  Tobacco Use   Smoking status: Never    Passive exposure: Past   Smokeless tobacco: Never  Vaping Use   Vaping Use: Never used  Substance and Sexual Activity   Alcohol use: Yes    Alcohol/week: 1.0 standard drink of alcohol    Types: 1 Glasses of wine per week    Comment: 2 GLASSES PER WEEK   Drug use: Never   Sexual activity: Not Currently  Other Topics Concern   Not on file  Social History Narrative   Widowed mother of 3 (1 son, 2 daughters ->    1 daughter lives here in [place] area along with her granddaughter, other daughter lives in [place] when she left [place], she first went to [place], but Subsequently Moved Back E. and Further S. to [place] to be closer to her granddaughter and daughter here locally.  Son also lives relatively close.      Caffeine: 2 servings daily-1 coffee, 1 herbal tea.      Diet: Minimizes red meat, eats lots of fruits and vegetables, chicken and fish.  Drinks lots of water (almost 80 ounces).      Exercise: Walks routinely 5 days a week, and started going to a dance class and exercise class.   Social Determinants of Health   Financial Resource Strain: Low Risk  (03/29/2022)   Overall Financial Resource Strain (CARDIA)    Difficulty of Paying Living Expenses: Not hard at all  Food Insecurity: No Food Insecurity (03/29/2022)   Hunger Vital Sign    Worried About Running Out of Food in the Last Year: Never true    Ran Out of Food in the Last Year: Never true  Transportation Needs: No Transportation Needs (03/29/2022)   PRAPARE - Hydrologist (Medical): No    Lack of Transportation (Non-Medical): No  Physical Activity:  Sufficiently Active (03/29/2022)  Exercise Vital Sign    Days of Exercise per Week: 4 days    Minutes of Exercise per Session: 60 min  Stress: No Stress Concern Present (03/29/2022)   [place]    Feeling of Stress : Not at all  Social Connections: Moderately Integrated (03/29/2022)   Social Connection and Isolation Panel [NHANES]    Frequency of Communication with Friends and Family: More than three times a week    Frequency of Social Gatherings with Friends and Family: More than three times a week    Attends Religious Services: More than 4 times per year    Active Member of [employer] or Organizations: Yes    Attends Archivist Meetings: 1 to 4 times per year    Marital Status: Widowed  Intimate Partner Violence: Not At Risk (03/29/2022)   Humiliation, Afraid, Rape, and Kick questionnaire    Fear of Current or Ex-Partner: No    Emotionally Abused: No    Physically Abused: No    Sexually Abused: No     Review of Systems    General:  No chills, fever, night sweats or weight changes.  Cardiovascular:  No chest pain, dyspnea on exertion, edema, orthopnea, palpitations, paroxysmal nocturnal dyspnea. Dermatological: No rash, lesions/masses Respiratory: No cough, dyspnea Urologic: No hematuria, dysuria Abdominal:   No nausea, vomiting, diarrhea, bright red blood per rectum, melena, or hematemesis Neurologic:  No visual changes, wkns, changes in mental status. All other systems reviewed and are otherwise negative except as noted above.  Physical Exam    VS:  BP 132/62   Pulse (!) 58   Ht '5\' 6"'$  (1.676 m)   Wt 142 lb 8 oz (64.6 kg)   BMI 23.00 kg/m  , BMI Body mass index is 23 kg/m. GEN: Well nourished, well developed, in no acute distress. HEENT: normal. Neck: Supple, no JVD, carotid bruits, or masses. Cardiac: RRR, no murmurs, rubs, or gallops. No clubbing, cyanosis, edema.  Radials/DP/PT 2+ and equal  bilaterally.  Respiratory:  Respirations regular and unlabored, clear to auscultation bilaterally. GI: Soft, nontender, nondistended, BS + x 4. MS: no deformity or atrophy. Skin: warm and dry, no rash. Neuro:  Strength and sensation are intact. Psych: Normal affect.  Accessory Clinical Findings    Recent Labs: 01/19/2022: ALT 31; TSH 0.971 05/23/2022: BUN 11; Creatinine, Ser 0.67; Potassium 3.9; Sodium 139 10/19/2022: Hemoglobin 12.7; Platelet Count 192   Recent Lipid Panel No results found for: "CHOL", "TRIG", "HDL", "CHOLHDL", "VLDL", "LDLCALC", "LDLDIRECT"       ECG personally reviewed by me today-sinus bradycardia with fusion complexes 59 bpm- No acute changes  EKG 06/20/2022  Normal sinus rhythm no ectopy  Assessment & Plan   1.  Coronary artery disease-no chest pain today.  Denies recent episodes of chest pressure or tightness.  Somewhat physically active.  Previously noted to have EKG that showed poor R wave progression, also with preop EKG that showed T wave inversion in anterior leads.  EKG 06/20/2022 showed normal sinus rhythm.  Underwent cardiac catheterization prior to moving to [facility] area which showed mild nonobstructive disease, 50% D1 lesion 2021 in [place]. Continue aspirin, ezetimibe, metoprolol, rosuvastatin-will refill all cardiac medications for 90 days Heart healthy low-sodium diet-salty 6 given Increase physical activity as tolerated  Dizzy-Notes with changing head position.  Epley maneuvers and follow up with pcp Continue hydration Change positions slowly  Hyperlipidemia- compliant with statin therapy Continue rosuvastatin, aspirin Heart healthy low-sodium high-fiber diet  Increase physical activity as tolerated Follows with PCP  Essential hypertension-BP today 132/62 Continue metoprolol, losartan Heart healthy low-sodium diet Increase physical activity as tolerated Maintain blood pressure log  Disposition: Follow-up with Dr. Ellyn Hack or me in  9-12 months.   Jossie Ng. Nijah Orlich NP-C     12/21/2022, 12:06 PM [place] [address] Office [phone] Fax [phone]    I spent 14 minutes examining this patient, reviewing medications, and using patient centered shared decision making involving her cardiac care.  Prior to her visit I spent greater than 20 minutes reviewing her past medical history,  medications, and prior cardiac tests.

## 2022-12-21 ENCOUNTER — Encounter: Payer: Self-pay | Admitting: General Practice

## 2022-12-21 ENCOUNTER — Ambulatory Visit: Payer: Medicare Other | Attending: General Practice | Admitting: General Practice

## 2022-12-21 VITALS — BP 132/62 | HR 58 | Ht 66.0 in | Wt 142.5 lb

## 2022-12-21 DIAGNOSIS — I10 Essential (primary) hypertension: Secondary | ICD-10-CM | POA: Diagnosis not present

## 2022-12-21 DIAGNOSIS — R42 Dizziness and giddiness: Secondary | ICD-10-CM

## 2022-12-21 DIAGNOSIS — E785 Hyperlipidemia, unspecified: Secondary | ICD-10-CM | POA: Diagnosis not present

## 2022-12-21 DIAGNOSIS — I2510 Atherosclerotic heart disease of native coronary artery without angina pectoris: Secondary | ICD-10-CM | POA: Diagnosis not present

## 2022-12-21 MED ORDER — LOSARTAN POTASSIUM 50 MG PO TABS: 50.0000 mg | ORAL_TABLET | Freq: Every day | ORAL | 3 refills | Status: AC

## 2022-12-21 MED ORDER — EZETIMIBE 10 MG PO TABS: 10.0000 mg | ORAL_TABLET | Freq: Every day | ORAL | 3 refills | Status: AC

## 2022-12-21 MED ORDER — METOPROLOL TARTRATE 25 MG PO TABS: 25.0000 mg | ORAL_TABLET | Freq: Two times a day (BID) | ORAL | 3 refills | Status: AC

## 2022-12-21 MED ORDER — ROSUVASTATIN CALCIUM 10 MG PO TABS: 10.0000 mg | ORAL_TABLET | Freq: Every day | ORAL | 3 refills | Status: AC

## 2022-12-21 NOTE — Patient Instructions (Addendum)
Medication Instructions:  The current medical regimen is effective;  continue present plan and medications as directed. Please refer to the Current Medication list given to you today.  *If you need a refill on your cardiac medications before your next appointment, please call your pharmacy*  Lab Work: NONE If you have labs (blood work) drawn today and your tests are completely normal, you will receive your results only by: [place] (if you have MyChart) OR A paper copy in the mail If you have any lab test that is abnormal or we need to change your treatment, we will call you to review the results.  Testing/Procedures: NONE  Follow-Up: At [facility], you and your health needs are our priority.  As part of our continuing mission to provide you with exceptional heart care, we have created designated Provider Care Teams.  These Care Teams include your primary Cardiologist (physician) and Advanced Practice Providers (APPs -  Physician Assistants and Nurse Practitioners) who all work together to provide you with the care you need, when you need it.  Your next appointment:   9-12 month(s)  Provider:   Glenetta Hew, MD  or Coletta Memos, FNP        Other Instructions MAINTAIN DIET AND EXERCISE AND DIET  EPLEY MANEUVERS DAILY

## 2022-12-31 IMAGING — DX DG ABD PORTABLE 1V
1 series · 1 of 1 positions shown · non-contrast
Comparison: None Available.

CLINICAL DATA: Abdominal distention

EXAM:
PORTABLE ABDOMEN - 1 VIEW

[abdomen kub]
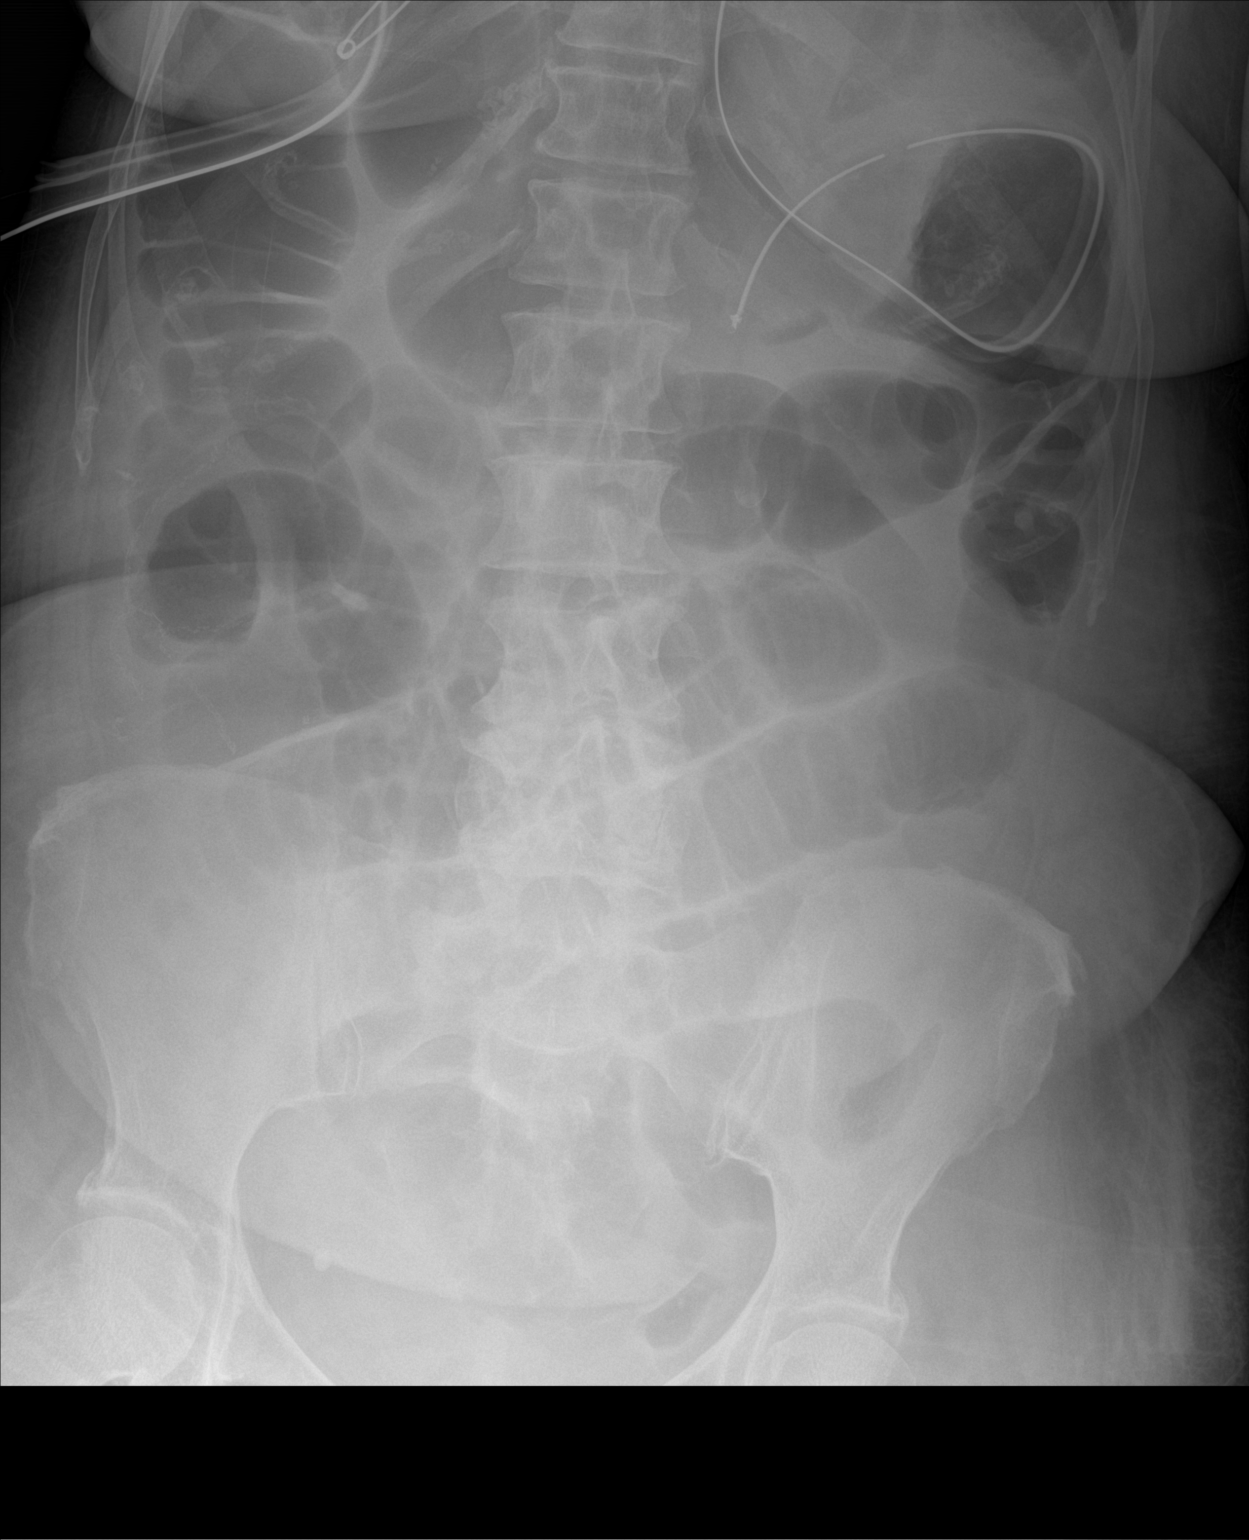

[1 of 1 positions shown; findings below may reference images not displayed]

FINDINGS: Tip enteric tube is seen in the stomach. There is dilation of
small-bowel loops measuring up to 4.6 cm. Gas is present in colon.
IMPRESSION: Dilation of small-bowel loops may be due to ileus or partial
obstruction. Tip of enteric tube is seen in the lumen of stomach.

## 2022-12-31 IMAGING — DX DG CHEST 1V PORT
1 series · 1 of 1 positions shown · non-contrast
Comparison: None Available.

CLINICAL DATA: Hypoxia.  Hemicolectomy 05/18/2022

EXAM:
PORTABLE CHEST 1 VIEW

[chest ap]
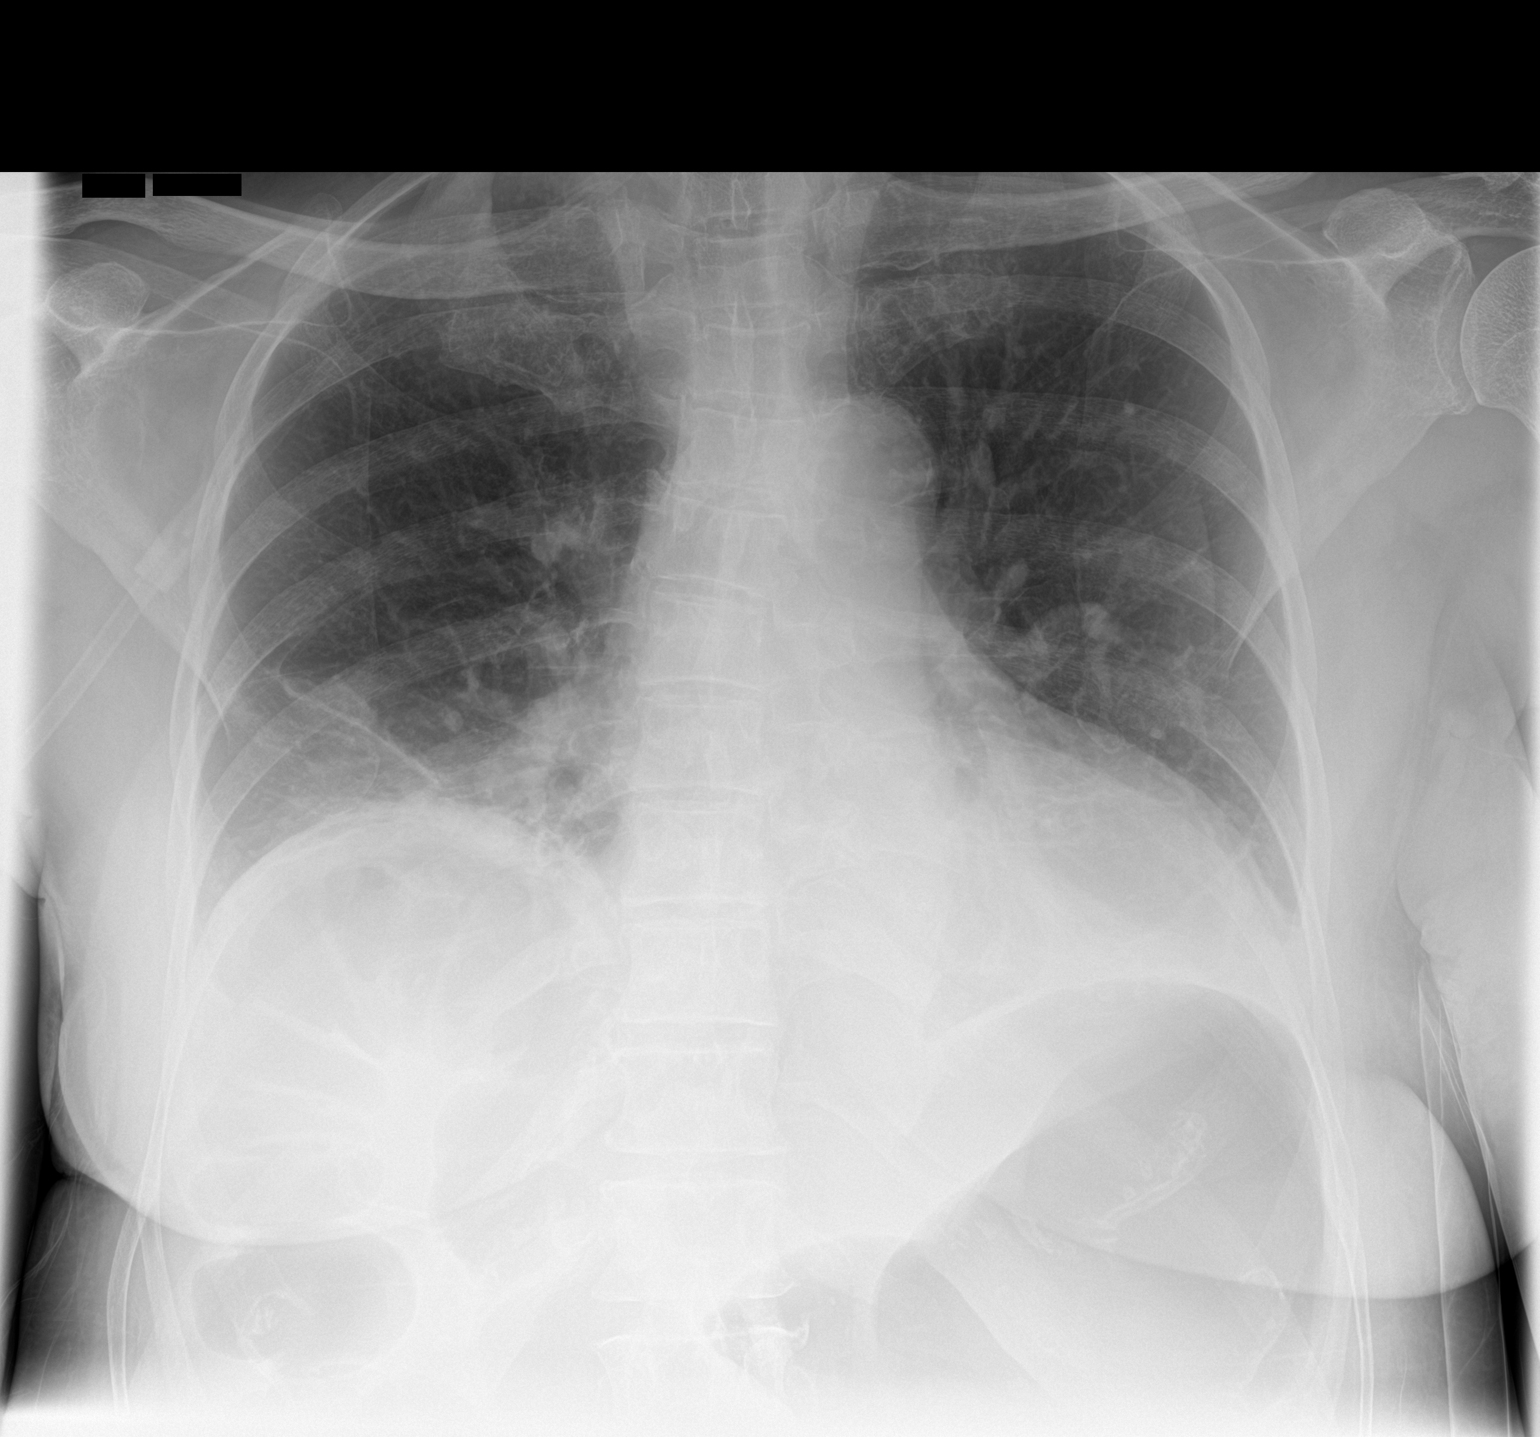

[1 of 1 positions shown; findings below may reference images not displayed]

FINDINGS: Heart is mildly enlarged, exaggerated by low lung volumes.
Atherosclerotic calcifications are present at the aortic arch.

New bibasilar airspace opacities are present. Left effusion
suspected.
IMPRESSION: 1. New bibasilar airspace disease is concerning for infection or
aspiration.
2. Probable left pleural effusion.
3. Borderline cardiomegaly.

## 2023-02-07 ENCOUNTER — Telehealth: Payer: Medicare Other | Admitting: Physician Assistant

## 2023-02-07 NOTE — Progress Notes (Signed)
The patient no-showed for appointment despite this provider sending direct link with no response and waiting for at least 10 minutes from appointment time for patient to join. They will be marked as a NS for this appointment/time.   Leeanne Rio, PA-C

## 2023-02-08 ENCOUNTER — Encounter: Payer: Self-pay | Admitting: Family Medicine

## 2023-02-08 ENCOUNTER — Ambulatory Visit (INDEPENDENT_AMBULATORY_CARE_PROVIDER_SITE_OTHER): Payer: Medicare Other | Admitting: Family Medicine

## 2023-02-08 VITALS — BP 134/73 | HR 69 | Temp 97.8°F | Ht 66.0 in | Wt 143.4 lb

## 2023-02-08 DIAGNOSIS — J329 Chronic sinusitis, unspecified: Secondary | ICD-10-CM | POA: Diagnosis not present

## 2023-02-08 DIAGNOSIS — R519 Headache, unspecified: Secondary | ICD-10-CM

## 2023-02-08 LAB — POC COVID19 BINAXNOW: SARS Coronavirus 2 Ag: NEGATIVE

## 2023-02-08 MED ORDER — AMOXICILLIN-POT CLAVULANATE 875-125 MG PO TABS: 1.0000 | ORAL_TABLET | Freq: Two times a day (BID) | ORAL | 0 refills | Status: DC

## 2023-02-08 MED ORDER — AZELASTINE HCL 0.1 % NA SOLN: 2.0000 | Freq: Two times a day (BID) | NASAL | 12 refills | Status: AC

## 2023-02-08 NOTE — Progress Notes (Signed)
   Caitlin Parks is a 77 y.o. female who presents today for an office visit.  Assessment/Plan:  Sinusitis  No red flags.  Rapid COVID-negative.  Reassuring exam today.  Likely has viral URI.  Will start Astelin nasal spray.  She can use over-the-counter meds as needed.  Encouraged hydration. We will send in pocket prescription for Augmentin with instruction to not start unless symptoms fail to improve over the next several days.  We discussed reasons to return to care.  Follow-up as needed.    Subjective:  HPI:  Patient here with cough and congestion. This started a few days ago. Cough has improved but is still having sinus congestion. No fevers. Some chills. Some headache. Some fatigue. No chest pain or difficulty breathing. Seems to be about the same the last couple of days.  Tried taking mucinex and imodium with some improvement. No sick contacts.        Objective:  Physical Exam: BP 134/73 (BP Location: Left Arm, Patient Position: Sitting)   Pulse 69   Temp 97.8 F (36.6 C) (Temporal)   Ht '5\' 6"'$  (1.676 m)   Wt 143 lb 6.4 oz (65 kg)   SpO2 100%   BMI 23.15 kg/m   Gen: No acute distress, resting comfortably HEENT: TMs are clear effusion.  OP erythematous.  Nasal mucosa erythematous and boggy. CV: Regular rate and rhythm with no murmurs appreciated Pulm: Normal work of breathing, clear to auscultation bilaterally with no crackles, wheezes, or rhonchi Neuro: Grossly normal, moves all extremities Psych: Normal affect and thought content      Kacy Conely M. Jerline Pain, MD 02/08/2023 9:39 AM

## 2023-02-08 NOTE — Patient Instructions (Signed)
It was very nice to see you today!  You may be developing a sinus infection.  Please start the Manson.  Start the Augmentin if not proving in the next few days.  Make sure that you are getting plenty of fluids.  You could continue using over-the-counter meds as needed.  Let us know if not improving.  Take care, Dr Jerline Pain  PLEASE NOTE:  If you had any lab tests, please let us know if you have not heard back within a few days. You may see your results on mychart before we have a chance to review them but we will give you a call once they are reviewed by [place].   If we ordered any referrals today, please let us know if you have not heard from their office within the next week.   If you had any urgent prescriptions sent in today, please check with the pharmacy within an hour of our visit to make sure the prescription was transmitted appropriately.   Please try these tips to maintain a healthy lifestyle:  Eat at least 3 REAL meals and 1-2 snacks per day.  Aim for no more than 5 hours between eating.  If you eat breakfast, please do so within one hour of getting up.   Each meal should contain half fruits/vegetables, one quarter protein, and one quarter carbs (no bigger than a computer mouse)  Cut down on sweet beverages. This includes juice, soda, and sweet tea.   Drink at least 1 glass of water with each meal and aim for at least 8 glasses per day  Exercise at least 150 minutes every week.

## 2023-02-17 ENCOUNTER — Telehealth: Payer: Self-pay | Admitting: Gastroenterology

## 2023-02-17 NOTE — Telephone Encounter (Signed)
Dr Tarri Glenn please advise if pt needs to have colon sooner than 2026. She mentions that Dr Dema Severin recommends her to have one year after surgery.

## 2023-02-17 NOTE — Telephone Encounter (Signed)
Inbound call from patient, states she was told by Dr. Dema Severin that most providers recommend to have a colonoscopy a year after her surgery. She is inquiring if Dr. Tarri Glenn would change the recall for 2026 to now, or if she still recommends the patient to have a colonoscopy in 2026.

## 2023-02-20 NOTE — Telephone Encounter (Signed)
Sophia can you please call and schedule this pt for a previsit and colon? Thank you

## 2023-03-21 ENCOUNTER — Encounter: Payer: Self-pay | Admitting: Family Medicine

## 2023-03-21 ENCOUNTER — Ambulatory Visit (INDEPENDENT_AMBULATORY_CARE_PROVIDER_SITE_OTHER): Payer: Medicare Other | Admitting: Family Medicine

## 2023-03-21 VITALS — BP 142/78 | HR 71 | Temp 98.2°F | Ht 66.0 in | Wt 142.1 lb

## 2023-03-21 DIAGNOSIS — M542 Cervicalgia: Secondary | ICD-10-CM | POA: Diagnosis not present

## 2023-03-21 DIAGNOSIS — H6120 Impacted cerumen, unspecified ear: Secondary | ICD-10-CM

## 2023-03-21 DIAGNOSIS — H6122 Impacted cerumen, left ear: Secondary | ICD-10-CM | POA: Diagnosis not present

## 2023-03-21 NOTE — Progress Notes (Signed)
Subjective:     Patient ID: Caitlin Parks, female    DOB: 01-05-1946, 77 y.o.   MRN: [id number]  Chief Complaint  Patient presents with   Pain    Pain on left side of body that started 5 days ago, possible pulled muscle     Referral    Discuss ENT referral for ear issue      HPI-work in  Pain left side of body for 5 days-was doing chair exercising last week(s) w/light weights and then that eve-"excruciating pain whole left side of body" more at night and improved during day-took OTC (available over the counter without a prescription) pain medications. And improved after 2 days. Still some residual pain from head to left upper arm.  Leg better.  No weakness.  No confusion. No sick. Has some chronic neck pain.  Some lightheadedness intermitt-can be at rest. Not eating much in general.  Not checking blood pressure.    Not sleepin well at hepatic steatosis (fatty liver disease).  Some hearing loss left ear and needs ears cleaned annually B.  [place] 2 years ago.  No pain. Marland Kitchen   Health Maintenance Due  Topic Date Due   DTaP/Tdap/Td (1 - Tdap) 09/06/1999   Medicare Annual Wellness (AWV)  03/30/2023    Past Medical History:  Diagnosis Date   Adenomatous colon polyp 2018   Allergy    SEASONAL   Breast cancer    Breast cancer in female 02/2013   Right-sided breast cancer-s/p right mastectomy.  No chemotherapy or radiation.   Essential hypertension    Family history of breast cancer    Family history of colon cancer    GERD (gastroesophageal reflux disease)    Hyperlipidemia LDL goal <100    Documented coronary calcification with mild CAD.   Leukopenia    Neutropenia    Pneumonia    Seasonal allergies     Past Surgical History:  Procedure Laterality Date   AUGMENTATION MAMMAPLASTY     COLONOSCOPY     LAPAROSCOPIC RIGHT HEMI COLECTOMY Right 05/18/2022   Procedure: LAPAROSCOPIC RIGHT HEMI COLECTOMY;  Surgeon: Andria Meuse, MD;  Location: [facility];  Service: General;  Laterality: Right;   LEFT HEART CATH AND CORONARY ANGIOGRAPHY  07/14/2020   [place], [place], [place] : no flow-limiting lesions.  Proximal LAD calcified lesion: FFR 0.95.  D1 to Strader bifurcation 50% stenosis.  Normal RCA and LCx.   MASTECTOMY Right 02/04/2013   NM MYOVIEW LTD  07/2020   Read as is "abnormal"-referred for cath that was normal.   PLACEMENT OF BREAST IMPLANTS Right 08/2013   POLYPECTOMY  05/18/2022   colon   TRANSTHORACIC ECHOCARDIOGRAM  2021   ([place], [place]) normal EF with no RWMA.   TUBAL LIGATION  1973   Reversed in 1981   Tubal ligation reversal  1981    Outpatient Medications Prior to Visit  Medication Sig Dispense Refill   Ascorbic Acid (VITAMIN C) 500 MG CAPS Take 500 mg by mouth in the morning.     aspirin 81 MG EC tablet Take 1 tablet (81 mg total) by mouth daily. 30 tablet 11   azelastine (ASTELIN) 0.1 % nasal spray Place 2 sprays into both nostrils 2 (two) times daily. 30 mL 12   calcium carbonate (OS-CAL - DOSED IN MG OF ELEMENTAL CALCIUM) 1250 (500 Ca) MG tablet Take 1 tablet by mouth.     Cholecalciferol (VITAMIN D3) 50 MCG (2000 UT) TABS Take 1,000  Units by mouth in the morning.     ezetimibe (ZETIA) 10 MG tablet Take 1 tablet (10 mg total) by mouth at bedtime. 90 tablet 3   fexofenadine (ALLEGRA) 180 MG tablet Take 180 mg by mouth daily as needed (allergies.).     fluticasone (FLONASE) 50 MCG/ACT nasal spray Place 1-2 sprays into both nostrils daily as needed for allergies.     losartan (COZAAR) 50 MG tablet Take 1 tablet (50 mg total) by mouth daily. 90 tablet 3   metoprolol tartrate (LOPRESSOR) 25 MG tablet Take 1 tablet (25 mg total) by mouth 2 (two) times daily. 180 tablet 3   Multiple Vitamins-Minerals (CENTRUM SILVER PO) Take 1 tablet by mouth in the morning.     pantoprazole (PROTONIX) 20 MG tablet Take 20 mg by mouth daily as needed for heartburn or indigestion.     Probiotic Product (PROBIOTIC PO) Take 1  capsule by mouth in the morning.     rosuvastatin (CRESTOR) 10 MG tablet Take 1 tablet (10 mg total) by mouth daily. 90 tablet 3   RSV vaccine recomb adjuvanted (AREXVY) 120 MCG/0.5ML injection Inject into the muscle. 0.5 mL 0   vitamin B-12 (CYANOCOBALAMIN) 1000 MCG tablet Take 1,000 mcg by mouth in the morning.     Wheat Dextrin (BENEFIBER DRINK MIX) PACK Take 1 packet by mouth in the morning and at bedtime.     amoxicillin-clavulanate (AUGMENTIN) 875-125 MG tablet Take 1 tablet by mouth 2 (two) times daily. 20 tablet 0   No facility-administered medications prior to visit.    Allergies  Allergen Reactions   E.E.S. [Erythromycin] Diarrhea   Vibramycin [Doxycycline] Rash and Other (See Comments)   ROS neg/noncontributory except as noted HPI/below      Objective:     BP (!) 142/78 (BP Location: Left Arm, Patient Position: Standing)   Pulse 71   Temp 98.2 F (36.8 C) (Temporal)   Ht  (1.676 m)   Wt 142 lb 2 oz (64.5 kg)   SpO2 98%   BMI 22.94 kg/m  Wt Readings from Last 3 Encounters:  03/21/23 142 lb 2 oz (64.5 kg)  02/08/23 143 lb 6.4 oz (65 kg)  12/21/22 142 lb 8 oz (64.6 kg)    Physical Exam   Gen: WDWN NAD HEENT: NCAT, conjunctiva not injected, sclera nonicteric TM WNL right, scant wax.  Left-almost obstructed by wax, OP moist, no exudates  NECK:  supple, no thyromegaly, no nodes, no carotid bruits CARDIAC: RRR, S1S2+, no murmur.  LUNGS: CTAB. No wheezes EXT:  no edema MSK: MS 5/5 all 4.  +TTP and very tight muscles left posterior neck to shoulder, left pectoralis.   NEURO: A&O x3.  CN II-XII intact.  PSYCH: normal mood. Good eye contact  CMA-QJ performed lavage  Ear Cerumen Removal  Date/Time: 03/21/2023 12:09 PM  Performed by: Jeani Sow, MD Authorized by: Jeani Sow, MD   Anesthesia: Local Anesthetic: none Location details: left ear Patient tolerance: patient tolerated the procedure well with no immediate complications Comments:  Verbal informed consent obtained.  Large amt wax Procedure type: irrigation  Sedation: Patient sedated: no          Assessment & Plan:   Problem List Items Addressed This Visit   None Visit Diagnoses     Neck pain on left side    -  Primary   Wax in ear          Left sided neck/arm pain-suspect from exercise/muscle pull-improving.  Ibuprofen or aleve and tylenol per directions.  Heat/stretches.  Wax left ear-irrig resolved.  So not need ENT.   Follow up PCP for insomnia and blood pressure.  Get cuff and monitor bp's  No orders of the defined types were placed in this encounter.   Angelena Sole, MD

## 2023-03-21 NOTE — Patient Instructions (Signed)
Light stretches.  Heat.  Massage if want Ibuprofen or aleve.   add tylenol if needed.

## 2023-03-28 ENCOUNTER — Ambulatory Visit: Payer: Medicare Other | Admitting: Physician Assistant

## 2023-03-30 ENCOUNTER — Ambulatory Visit (INDEPENDENT_AMBULATORY_CARE_PROVIDER_SITE_OTHER): Payer: Medicare Other | Admitting: Physician Assistant

## 2023-03-30 VITALS — BP 144/74 | HR 59 | Temp 97.6°F | Ht 66.0 in | Wt 144.0 lb

## 2023-03-30 DIAGNOSIS — R5383 Other fatigue: Secondary | ICD-10-CM | POA: Diagnosis not present

## 2023-03-30 DIAGNOSIS — R42 Dizziness and giddiness: Secondary | ICD-10-CM

## 2023-03-30 DIAGNOSIS — R519 Headache, unspecified: Secondary | ICD-10-CM | POA: Diagnosis not present

## 2023-03-30 DIAGNOSIS — R110 Nausea: Secondary | ICD-10-CM | POA: Diagnosis not present

## 2023-03-30 LAB — CBC WITH DIFFERENTIAL/PLATELET
Basophils Absolute: 0 10*3/uL (ref 0.0–0.1)
Basophils Relative: 0.3 % (ref 0.0–3.0)
Eosinophils Absolute: 0 10*3/uL (ref 0.0–0.7)
Eosinophils Relative: 1 % (ref 0.0–5.0)
HCT: 39.2 % (ref 36.0–46.0)
Hemoglobin: 13.3 g/dL (ref 12.0–15.0)
Lymphocytes Relative: 40.5 % (ref 12.0–46.0)
Lymphs Abs: 1.2 10*3/uL (ref 0.7–4.0)
MCHC: 34 g/dL (ref 30.0–36.0)
MCV: 105.4 fl — ABNORMAL HIGH (ref 78.0–100.0)
Monocytes Absolute: 0.3 10*3/uL (ref 0.1–1.0)
Monocytes Relative: 8.7 % (ref 3.0–12.0)
Neutro Abs: 1.5 10*3/uL (ref 1.4–7.7)
Neutrophils Relative %: 49.5 % (ref 43.0–77.0)
Platelets: 194 10*3/uL (ref 150.0–400.0)
RBC: 3.72 Mil/uL — ABNORMAL LOW (ref 3.87–5.11)
RDW: 13.3 % (ref 11.5–15.5)
WBC: 3 10*3/uL — ABNORMAL LOW (ref 4.0–10.5)

## 2023-03-30 LAB — COMPREHENSIVE METABOLIC PANEL
ALT: 23 U/L (ref 0–35)
AST: 21 U/L (ref 0–37)
Albumin: 4.2 g/dL (ref 3.5–5.2)
Alkaline Phosphatase: 58 U/L (ref 39–117)
BUN: 11 mg/dL (ref 6–23)
CO2: 29 mEq/L (ref 19–32)
Calcium: 10 mg/dL (ref 8.4–10.5)
Chloride: 105 mEq/L (ref 96–112)
Creatinine, Ser: 0.8 mg/dL (ref 0.40–1.20)
GFR: 71.62 mL/min (ref >60.00)
Glucose, Bld: 87 mg/dL (ref 70–99)
Potassium: 4.1 mEq/L (ref 3.5–5.1)
Sodium: 141 mEq/L (ref 135–145)
Total Bilirubin: 0.4 mg/dL (ref 0.2–1.2)
Total Protein: 6.9 g/dL (ref 6.0–8.3)

## 2023-03-30 LAB — TSH: TSH: 0.55 u[IU]/mL (ref 0.35–5.50)

## 2023-03-30 NOTE — Patient Instructions (Addendum)
MRI brain scheduled for 3 pm tomorrow at [facility] Be there at 2:30 pm  Today - Labs & orthostatic vitals   IF SUDDEN WORSENING SYMPTOMS, YOU NEED TO PRESENT TO THE EMERGENCY DEPARTMENT BEFORE THEN

## 2023-03-30 NOTE — Progress Notes (Signed)
Subjective:    Patient ID: Caitlin Parks, female    DOB: 22-Jun-1946, 77 y.o.   MRN: [id number]  Chief Complaint  Patient presents with   Medical Management of Chronic Issues    Pt f/u from visit with Ruthine Dose regarding insomnia and hypertension issues; pt also still having pain on left side of patient's body, still having headaches as well and feeling lightheaded several times throughout the day even when just sitting. Headaches are worse at night.                                                                                                                                                                                            HPI Patient is in today for several concerns as noted in CC from CMA. Symptoms started in the last few weeks.   Today  -feels lightheaded, somewhat nauseated -dull headache diffuse right now -pounding, pulling pain around top crown of her headworse at night, worse in posterior part of head -no hx of HA's or migraines -ibuprofen / tylenol doesn't help much   Chair yoga with light weights - twisted to the left and apparently pulled muscles on her whole left side. York Spaniel it went from the top of her head to her feet. Neck feels fine now.   No chest pain or SOB. No vision changes. Sometimes feels weak, like she doesn't have energy.  Says she is usually walking and has a lot of energy to stay physically active, so this is not normal at all for her.   Not sleeping, sometimes still looking at clock at 3 am.  No trauma or falls. Hx breast cancer & adenomatous colon polyp   Past Medical History:  Diagnosis Date   Adenomatous colon polyp 2018   Allergy    SEASONAL   Breast cancer (HCC)    Breast cancer in female [facility]) 02/2013   Right-sided breast cancer-s/p right mastectomy.  No chemotherapy or radiation.   Essential hypertension    Family history of breast cancer    Family history of colon cancer    GERD (gastroesophageal reflux disease)    Hyperlipidemia  LDL goal <100    Documented coronary calcification with mild CAD.   Leukopenia    Neutropenia (HCC)    Pneumonia    Seasonal allergies     Past Surgical History:  Procedure Laterality Date   AUGMENTATION MAMMAPLASTY     COLONOSCOPY     LAPAROSCOPIC RIGHT HEMI COLECTOMY Right 05/18/2022   Procedure: LAPAROSCOPIC RIGHT HEMI COLECTOMY;  Surgeon: Andria Meuse, MD;  Location: [facility];  Service: General;  Laterality: Right;   LEFT  HEART CATH AND CORONARY ANGIOGRAPHY  07/14/2020   [place], [place], [place] : no flow-limiting lesions.  Proximal LAD calcified lesion: FFR 0.95.  D1 to Strader bifurcation 50% stenosis.  Normal RCA and LCx.   MASTECTOMY Right 02/04/2013   NM MYOVIEW LTD  07/2020   Read as is "abnormal"-referred for cath that was normal.   PLACEMENT OF BREAST IMPLANTS Right 08/2013   POLYPECTOMY  05/18/2022   colon   TRANSTHORACIC ECHOCARDIOGRAM  2021   ([place], [place]) normal EF with no RWMA.   TUBAL LIGATION  1973   Reversed in 1981   Tubal ligation reversal  1981    Family History  Problem Relation Age of Onset   Colon cancer Mother 86   Cancer Maternal Uncle        NOS   Cancer Paternal Aunt        2 PAs with unknown cancer   Alzheimer's disease Maternal Grandmother 24   Lung cancer Maternal Grandfather    Colon cancer Paternal Grandmother    Cancer - Colon Paternal Grandmother    Diabetes Mellitus II Paternal Grandfather    Breast cancer Other        MGFs sister   Esophageal cancer Neg Hx    Rectal cancer Neg Hx    Stomach cancer Neg Hx     Social History   Tobacco Use   Smoking status: Never    Passive exposure: Past   Smokeless tobacco: Never  Vaping Use   Vaping Use: Never used  Substance Use Topics   Alcohol use: Yes    Alcohol/week: 1.0 standard drink of alcohol    Types: 1 Glasses of wine per week    Comment: 2 GLASSES PER WEEK   Drug use: Never     Allergies  Allergen Reactions   E.E.S. [Erythromycin] Diarrhea    Vibramycin [Doxycycline] Rash and Other (See Comments)    Review of Systems NEGATIVE UNLESS OTHERWISE INDICATED IN HPI      Objective:     BP (!) 144/74 (BP Location: Left Arm)   Pulse (!) 59   Temp 97.6 F (36.4 C) (Temporal)   Ht 5\' 6"  (1.676 m)   Wt 144 lb (65.3 kg)   SpO2 99%   BMI 23.24 kg/m   Wt Readings from Last 3 Encounters:  03/30/23 144 lb (65.3 kg)  03/21/23 142 lb 2 oz (64.5 kg)  02/08/23 143 lb 6.4 oz (65 kg)    BP Readings from Last 3 Encounters:  03/30/23 (!) 144/74  03/21/23 (!) 142/78  02/08/23 134/73     Physical Exam Vitals and nursing note reviewed.  Constitutional:      Appearance: Normal appearance. She is normal weight. She is not toxic-appearing.     Comments: Well-appearing, but 'tired' appearing overall, not her baseline   HENT:     Head: Normocephalic and atraumatic.     Right Ear: Tympanic membrane, ear canal and external ear normal.     Left Ear: Tympanic membrane, ear canal and external ear normal.     Nose: Nose normal.     Mouth/Throat:     Mouth: Mucous membranes are moist.  Eyes:     Extraocular Movements: Extraocular movements intact.     Conjunctiva/sclera: Conjunctivae normal.     Pupils: Pupils are equal, round, and reactive to light.  Cardiovascular:     Rate and Rhythm: Normal rate and regular rhythm.     Pulses: Normal pulses.     Heart sounds:  Normal heart sounds.  Pulmonary:     Effort: Pulmonary effort is normal.     Breath sounds: Normal breath sounds.  Musculoskeletal:        General: Normal range of motion.     Cervical back: Normal range of motion and neck supple.     Right lower leg: No edema.     Left lower leg: No edema.  Skin:    General: Skin is warm and dry.  Neurological:     General: No focal deficit present.     Mental Status: She is alert and oriented to person, place, and time.     Cranial Nerves: No cranial nerve deficit.     Motor: No weakness.     Coordination: Coordination normal.      Gait: Gait normal.  Psychiatric:        Mood and Affect: Mood normal.        Behavior: Behavior normal.        Assessment & Plan:  New onset of headaches after age 45 -     MR BRAIN W WO CONTRAST; Future -     CBC with Differential/Platelet -     Comprehensive metabolic panel -     TSH  Lightheadedness -     CBC with Differential/Platelet -     Comprehensive metabolic panel -     TSH  Other fatigue -     MR BRAIN W WO CONTRAST; Future -     CBC with Differential/Platelet -     Comprehensive metabolic panel -     TSH  Nausea -     MR BRAIN W WO CONTRAST; Future -     CBC with Differential/Platelet -     Comprehensive metabolic panel -     TSH    77 year old female with new headaches, difficulty sleeping, dizziness-feeling. No acute, focal neurologic findings today.  Plan to check MRI Brain to r/o any pathology there. STAT labs today. Encouraged her to stay well-hydrated, rest. ED if any red flags arise - discussed with her, she is agreeable. Consider neurology referral. Follow-up pending results.    Lorean Ekstrand M Tymeka Privette, PA-C

## 2023-03-31 ENCOUNTER — Ambulatory Visit (HOSPITAL_COMMUNITY)
Admission: RE | Admit: 2023-03-31 | Discharge: 2023-03-31 | Disposition: A | Discharge status: Home / Self Care | Payer: Medicare Other | Source: Ambulatory Visit | Attending: Physician Assistant | Admitting: Physician Assistant

## 2023-03-31 DIAGNOSIS — R110 Nausea: Secondary | ICD-10-CM | POA: Diagnosis not present

## 2023-03-31 DIAGNOSIS — R5383 Other fatigue: Secondary | ICD-10-CM | POA: Diagnosis not present

## 2023-03-31 DIAGNOSIS — R519 Headache, unspecified: Secondary | ICD-10-CM

## 2023-03-31 MED ORDER — GADOBUTROL 1 MMOL/ML IV SOLN
6.5000 mL | Freq: Once | INTRAVENOUS | Status: AC | PRN
  Administered 2023-03-31: 6.5 mL via INTRAVENOUS

## 2023-04-06 ENCOUNTER — Telehealth: Payer: Self-pay | Admitting: Physician Assistant

## 2023-04-06 ENCOUNTER — Ambulatory Visit (INDEPENDENT_AMBULATORY_CARE_PROVIDER_SITE_OTHER): Payer: Medicare Other | Admitting: Physician Assistant

## 2023-04-06 VITALS — BP 142/67 | HR 72 | Temp 97.3°F | Ht 66.0 in | Wt 143.2 lb

## 2023-04-06 DIAGNOSIS — M542 Cervicalgia: Secondary | ICD-10-CM

## 2023-04-06 DIAGNOSIS — G4700 Insomnia, unspecified: Secondary | ICD-10-CM

## 2023-04-06 DIAGNOSIS — R42 Dizziness and giddiness: Secondary | ICD-10-CM | POA: Diagnosis not present

## 2023-04-06 MED ORDER — HYDROXYZINE HCL 10 MG PO TABS
10.0000 mg | ORAL_TABLET | Freq: Every evening | ORAL | 1 refills | Status: AC | PRN
Start: 2023-04-06 — End: ?

## 2023-04-06 MED ORDER — METHYLPREDNISOLONE 4 MG PO TBPK
ORAL_TABLET | ORAL | 0 refills | Status: DC
Start: 2023-04-06 — End: 2023-04-18

## 2023-04-06 NOTE — Telephone Encounter (Signed)
Contacted Annamary Rummage to schedule their annual wellness visit. Appointment made for 04/18/2023.  [facility] AWV TEAM Direct Dial [phone]

## 2023-04-06 NOTE — Progress Notes (Signed)
Subjective:    Patient ID: Caitlin Parks, female    DOB: [birth year], 77 y.o.   MRN: [id number]  Chief Complaint  Patient presents with   MRI Results    Pt in office to discuss MRI results in more detail, pt would like to explore more with dizziness and what's causing it. Pt brought in BP monitor she bought to compare to our reading and accurate readings compared to manual taken first    HPI Patient is in today for follow-up from previous visit.  MRI brain 03/31/23 -negative. Not much change from last visit, but headaches are not as bad.  Still has lightheadedness at times.  Left side of neck still "just hurts", goes into the base of her head and into proximal shoulder. Still having aching pain left arm.    03/30/23 HPI: Patient is in today for several concerns as noted in CC from CMA. Symptoms started in the last few weeks.    Today  -feels lightheaded, somewhat nauseated -dull headache diffuse right now -pounding, pulling pain around top crown of her headworse at night, worse in posterior part of head -no hx of HA's or migraines -ibuprofen / tylenol doesn't help much    Chair yoga with light weights - twisted to the left and apparently pulled muscles on her whole left side. York Spaniel it went from the top of her head to her feet. Neck feels fine now.    No chest pain or SOB. No vision changes. Sometimes feels weak, like she doesn't have energy.  Says she is usually walking and has a lot of energy to stay physically active, so this is not normal at all for her.    Not sleeping, sometimes still looking at clock at 3 am.   No trauma or falls. Hx breast cancer & adenomatous colon polyp   Past Medical History:  Diagnosis Date   Adenomatous colon polyp 2018   Allergy    SEASONAL   Breast cancer (HCC)    Breast cancer in female [facility]) 02/2013   Right-sided breast cancer-s/p right mastectomy.  No chemotherapy or radiation.   Essential hypertension    Family history of breast  cancer    Family history of colon cancer    GERD (gastroesophageal reflux disease)    Hyperlipidemia LDL goal <100    Documented coronary calcification with mild CAD.   Leukopenia    Neutropenia (HCC)    Pneumonia    Seasonal allergies     Past Surgical History:  Procedure Laterality Date   AUGMENTATION MAMMAPLASTY     COLONOSCOPY     LAPAROSCOPIC RIGHT HEMI COLECTOMY Right 05/18/2022   Procedure: LAPAROSCOPIC RIGHT HEMI COLECTOMY;  Surgeon: Andria Meuse, MD;  Location: [facility];  Service: General;  Laterality: Right;   LEFT HEART CATH AND CORONARY ANGIOGRAPHY  07/14/2020   [place], [place], [place] : no flow-limiting lesions.  Proximal LAD calcified lesion: FFR 0.95.  D1 to Strader bifurcation 50% stenosis.  Normal RCA and LCx.   MASTECTOMY Right 02/04/2013   NM MYOVIEW LTD  07/2020   Read as is "abnormal"-referred for cath that was normal.   PLACEMENT OF BREAST IMPLANTS Right 08/2013   POLYPECTOMY  05/18/2022   colon   TRANSTHORACIC ECHOCARDIOGRAM  2021   ([place], [place]) normal EF with no RWMA.   TUBAL LIGATION  1973   Reversed in 1981   Tubal ligation reversal  1981    Family History  Problem Relation Age of Onset   Colon  cancer Mother 31   Cancer Maternal Uncle        NOS   Cancer Paternal Aunt        2 PAs with unknown cancer   Alzheimer's disease Maternal Grandmother 47   Lung cancer Maternal Grandfather    Colon cancer Paternal Grandmother    Cancer - Colon Paternal Grandmother    Diabetes Mellitus II Paternal Grandfather    Breast cancer Other        MGFs sister   Esophageal cancer Neg Hx    Rectal cancer Neg Hx    Stomach cancer Neg Hx     Social History   Tobacco Use   Smoking status: Never    Passive exposure: Past   Smokeless tobacco: Never  Vaping Use   Vaping Use: Never used  Substance Use Topics   Alcohol use: Yes    Alcohol/week: 1.0 standard drink of alcohol    Types: 1 Glasses of wine per week    Comment: 2 GLASSES PER  WEEK   Drug use: Never     Allergies  Allergen Reactions   E.E.S. [Erythromycin] Diarrhea   Vibramycin [Doxycycline] Rash and Other (See Comments)    Review of Systems NEGATIVE UNLESS OTHERWISE INDICATED IN HPI      Objective:     BP (!) 142/67 (BP Location: Left Arm) Comment (BP Location): Patient BP monitor  Pulse 72   Temp (!) 97.3 F (36.3 C) (Temporal)   Ht 5\' 6"  (1.676 m)   Wt 143 lb 3.2 oz (65 kg)   SpO2 98%   BMI 23.11 kg/m   Wt Readings from Last 3 Encounters:  04/06/23 143 lb 3.2 oz (65 kg)  03/30/23 144 lb (65.3 kg)  03/21/23 142 lb 2 oz (64.5 kg)    BP Readings from Last 3 Encounters:  04/06/23 (!) 142/67  03/30/23 (!) 144/74  03/21/23 (!) 142/78     Physical Exam Vitals and nursing note reviewed.  Constitutional:      Appearance: Normal appearance. She is normal weight. She is not toxic-appearing.  HENT:     Head: Normocephalic and atraumatic.  Eyes:     Extraocular Movements: Extraocular movements intact.     Conjunctiva/sclera: Conjunctivae normal.     Pupils: Pupils are equal, round, and reactive to light.  Cardiovascular:     Rate and Rhythm: Normal rate and regular rhythm.     Pulses: Normal pulses.     Heart sounds: Normal heart sounds.  Pulmonary:     Effort: Pulmonary effort is normal.     Breath sounds: Normal breath sounds.  Musculoskeletal:        General: Tenderness (tenderness / muscle spasm left sided neck) present. Normal range of motion.     Cervical back: Normal range of motion and neck supple.     Right lower leg: No edema.     Left lower leg: No edema.  Skin:    General: Skin is warm and dry.  Neurological:     General: No focal deficit present.     Mental Status: She is alert and oriented to person, place, and time.     Cranial Nerves: No cranial nerve deficit.     Motor: No weakness.     Coordination: Coordination normal.     Gait: Gait normal.  Psychiatric:        Mood and Affect: Mood normal.        Behavior:  Behavior normal.  Assessment & Plan:  Neck pain on left side -     methylPREDNISolone; Please take per packaging instructions.  Dispense: 21 tablet; Refill: 0 -     Ambulatory referral to Sports Medicine  Lightheadedness -     US Carotid Bilateral; Future  Dizziness -     US Carotid Bilateral; Future  Insomnia, unspecified type -     hydrOXYzine HCl; Take 1 tablet (10 mg total) by mouth at bedtime as needed (for anxiety / insomnia).  Dispense: 10 tablet; Refill: 1   MRI brain was negative thankfully. Headaches are improving, wonder if this has been 2/2 left sided neck pain. Will start on medrol dose pak and have consult with sports med about this / need for further imaging or not.   Still having lightheaded / dizzy intermittent spells. Will need to check US carotids. Consider neurology referral.  Trial hydroxyzine prn bedtime for insomnia. Pt aware of risks vs benefits and possible adverse reactions.     Return in about 6 weeks (around 05/18/2023) for recheck/follow-up.    Douglas Smolinsky M Zoie Sarin, PA-C

## 2023-04-06 NOTE — Patient Instructions (Signed)
Good to see you again today! I'm glad your brain MRI was not concerning.  I will have you do a carotid Ultrasound to make sure your arteries are widely open and not contributing to dizzy / lightheaded feelings.  Referral to Sports medicine for second opinion on your neck. Start on medrol dose pak in the meantime to help with neck pain. NO ibuprofen or aleve with this, but Tylenol is ok.  Try hydroxyzine as needed at bedtime to help with sleep.

## 2023-04-10 NOTE — Progress Notes (Unsigned)
[facility] [address] [place] [address] Phone: [phone]   Assessment and Plan:     There are no diagnoses linked to this encounter.  ***   Pertinent previous records reviewed include ***   Follow Up: ***     Subjective:                  [facility] [address] [place] [address] Phone: [phone]   Assessment and Plan:     There are no diagnoses linked to this encounter.  ***   Pertinent previous records reviewed include ***   Follow Up: ***     Subjective:   I, Edlin Ford, am serving as a Neurosurgeon for Doctor Richardean Sale  Chief Complaint: neck and back pain   HPI:   04/11/2023 Patient is a 77 year old female complaining of neck and back pain. Patient states  Relevant Historical Information: ***  Additional pertinent review of systems negative.   Current Outpatient Medications:    Ascorbic Acid (VITAMIN C) 500 MG CAPS, Take 500 mg by mouth in the morning., Disp: , Rfl:    aspirin 81 MG EC tablet, Take 1 tablet (81 mg total) by mouth daily., Disp: 30 tablet, Rfl: 11   azelastine (ASTELIN) 0.1 % nasal spray, Place 2 sprays into both nostrils 2 (two) times daily., Disp: 30 mL, Rfl: 12   calcium carbonate (OS-CAL - DOSED IN MG OF ELEMENTAL CALCIUM) 1250 (500 Ca) MG tablet, Take 1 tablet by mouth., Disp: , Rfl:    Cholecalciferol (VITAMIN D3) 50 MCG (2000 UT) TABS, Take 1,000 Units by mouth in the morning., Disp: , Rfl:    ezetimibe (ZETIA) 10 MG tablet, Take 1 tablet (10 mg total) by mouth at bedtime., Disp: 90 tablet, Rfl: 3   fexofenadine (ALLEGRA) 180 MG tablet, Take 180 mg by mouth daily as needed (allergies.)., Disp: , Rfl:    fluticasone (FLONASE) 50 MCG/ACT nasal spray, Place 1-2 sprays into both nostrils daily as needed for allergies., Disp: , Rfl:    hydrOXYzine (ATARAX) 10 MG tablet, Take 1 tablet (10 mg total) by mouth at bedtime as  needed (for anxiety / insomnia)., Disp: 10 tablet, Rfl: 1   losartan (COZAAR) 50 MG tablet, Take 1 tablet (50 mg total) by mouth daily., Disp: 90 tablet, Rfl: 3   methylPREDNISolone (MEDROL DOSEPAK) 4 MG TBPK tablet, Please take per packaging instructions., Disp: 21 tablet, Rfl: 0   metoprolol tartrate (LOPRESSOR) 25 MG tablet, Take 1 tablet (25 mg total) by mouth 2 (two) times daily., Disp: 180 tablet, Rfl: 3   Multiple Vitamins-Minerals (CENTRUM SILVER PO), Take 1 tablet by mouth in the morning., Disp: , Rfl:    pantoprazole (PROTONIX) 20 MG tablet, Take 20 mg by mouth daily as needed for heartburn or indigestion., Disp: , Rfl:    Probiotic Product (PROBIOTIC PO), Take 1 capsule by mouth in the morning., Disp: , Rfl:    rosuvastatin (CRESTOR) 10 MG tablet, Take 1 tablet (10 mg total) by mouth daily., Disp: 90 tablet, Rfl: 3   RSV vaccine recomb adjuvanted (AREXVY) 120 MCG/0.5ML injection, Inject into the muscle., Disp: 0.5 mL, Rfl: 0   vitamin B-12 (CYANOCOBALAMIN) 1000 MCG tablet, Take 1,000 mcg by mouth in the morning., Disp: , Rfl:    Wheat Dextrin (BENEFIBER DRINK MIX) PACK, Take 1 packet by mouth in the morning and at bedtime., Disp: , Rfl:  Objective:     There were no vitals filed for this visit.    There is no height or weight on file to calculate BMI.    Physical Exam:    ***   Electronically signed by:  [facility] 12:08 PM 04/10/23  Chief Complaint: neck and back pain   HPI:   04/10/23 ***  Relevant Historical Information: ***  Additional pertinent review of systems negative.   Current Outpatient Medications:    Ascorbic Acid (VITAMIN C) 500 MG CAPS, Take 500 mg by mouth in the morning., Disp: , Rfl:    aspirin 81 MG EC tablet, Take 1 tablet (81 mg total) by mouth daily., Disp: 30 tablet, Rfl: 11   azelastine (ASTELIN) 0.1 % nasal spray, Place 2 sprays into both nostrils 2 (two) times daily., Disp: 30 mL, Rfl: 12   calcium  carbonate (OS-CAL - DOSED IN MG OF ELEMENTAL CALCIUM) 1250 (500 Ca) MG tablet, Take 1 tablet by mouth., Disp: , Rfl:    Cholecalciferol (VITAMIN D3) 50 MCG (2000 UT) TABS, Take 1,000 Units by mouth in the morning., Disp: , Rfl:    ezetimibe (ZETIA) 10 MG tablet, Take 1 tablet (10 mg total) by mouth at bedtime., Disp: 90 tablet, Rfl: 3   fexofenadine (ALLEGRA) 180 MG tablet, Take 180 mg by mouth daily as needed (allergies.)., Disp: , Rfl:    fluticasone (FLONASE) 50 MCG/ACT nasal spray, Place 1-2 sprays into both nostrils daily as needed for allergies., Disp: , Rfl:    hydrOXYzine (ATARAX) 10 MG tablet, Take 1 tablet (10 mg total) by mouth at bedtime as needed (for anxiety / insomnia)., Disp: 10 tablet, Rfl: 1   losartan (COZAAR) 50 MG tablet, Take 1 tablet (50 mg total) by mouth daily., Disp: 90 tablet, Rfl: 3   methylPREDNISolone (MEDROL DOSEPAK) 4 MG TBPK tablet, Please take per packaging instructions., Disp: 21 tablet, Rfl: 0   metoprolol tartrate (LOPRESSOR) 25 MG tablet, Take 1 tablet (25 mg total) by mouth 2 (two) times daily., Disp: 180 tablet, Rfl: 3   Multiple Vitamins-Minerals (CENTRUM SILVER PO), Take 1 tablet by mouth in the morning., Disp: , Rfl:    pantoprazole (PROTONIX) 20 MG tablet, Take 20 mg by mouth daily as needed for heartburn or indigestion., Disp: , Rfl:    Probiotic Product (PROBIOTIC PO), Take 1 capsule by mouth in the morning., Disp: , Rfl:    rosuvastatin (CRESTOR) 10 MG tablet, Take 1 tablet (10 mg total) by mouth daily., Disp: 90 tablet, Rfl: 3   RSV vaccine recomb adjuvanted (AREXVY) 120 MCG/0.5ML injection, Inject into the muscle., Disp: 0.5 mL, Rfl: 0   vitamin B-12 (CYANOCOBALAMIN) 1000 MCG tablet, Take 1,000 mcg by mouth in the morning., Disp: , Rfl:    Wheat Dextrin (BENEFIBER DRINK MIX) PACK, Take 1 packet by mouth in the morning and at bedtime., Disp: , Rfl:    Objective:     There were no vitals filed for this visit.    There is no height or weight on file  to calculate BMI.    Physical Exam:    ***   Electronically signed by:  [facility] 12:08 PM 04/10/23

## 2023-04-11 ENCOUNTER — Ambulatory Visit (INDEPENDENT_AMBULATORY_CARE_PROVIDER_SITE_OTHER): Payer: Medicare Other

## 2023-04-11 ENCOUNTER — Ambulatory Visit: Payer: Medicare Other | Admitting: Sports Medicine

## 2023-04-11 VITALS — BP 132/80 | HR 73 | Ht 66.0 in | Wt 143.0 lb

## 2023-04-11 DIAGNOSIS — M542 Cervicalgia: Secondary | ICD-10-CM

## 2023-04-11 DIAGNOSIS — M5030 Other cervical disc degeneration, unspecified cervical region: Secondary | ICD-10-CM

## 2023-04-11 NOTE — Patient Instructions (Signed)
Neck HEP PT referral  Tylenol [phone] mg 2-3 times a day for pain relief  4-5 week follow up

## 2023-04-18 ENCOUNTER — Ambulatory Visit (INDEPENDENT_AMBULATORY_CARE_PROVIDER_SITE_OTHER): Payer: Medicare Other

## 2023-04-18 VITALS — Wt 143.0 lb

## 2023-04-18 DIAGNOSIS — Z0000 Encounter for general adult medical examination without abnormal findings: Secondary | ICD-10-CM | POA: Diagnosis not present

## 2023-04-18 NOTE — Progress Notes (Signed)
I connected with  Caitlin Parks on 04/18/23 by a audio enabled telemedicine application and verified that I am speaking with the correct person using two identifiers.  Patient Location: Home  Provider Location: Office/Clinic  I discussed the limitations of evaluation and management by telemedicine. The patient expressed understanding and agreed to proceed.    Patient Medicare AWV questionnaire was completed by the patient on 04/17/23; I have confirmed that all information answered by patient is correct and no changes since this date.       Subjective:   Caitlin Parks is a 77 y.o. female who presents for Medicare Annual (Subsequent) preventive examination.  Review of Systems     Cardiac Risk Factors include: advanced age (>62men, >30 women);dyslipidemia;hypertension     Objective:    Today's Vitals   04/18/23 1434  Weight: 143 lb (64.9 kg)   Body mass index is 23.08 kg/m.     04/18/2023    2:39 PM 10/19/2022   10:35 AM 07/19/2022    1:51 PM 05/18/2022    8:18 AM 05/10/2022   10:50 AM 03/29/2022   11:55 AM 07/16/2021    6:18 PM  Advanced Directives  Does Patient Have a Medical Advance Directive? Yes No No Yes Yes Yes No  Type of Estate agent of [place];Living will   Healthcare Power of [place];Living will Healthcare Power of [place];Living will Healthcare Power of [place];Living will   Does patient want to make changes to medical advance directive?    No - Patient declined     Copy of Healthcare Power of Attorney in Chart? No - copy requested   No - copy requested  No - copy requested   Would patient like information on creating a medical advance directive?  No - Patient declined No - Patient declined        Current Medications (verified) Outpatient Encounter Medications as of 04/18/2023  Medication Sig   Ascorbic Acid (VITAMIN C) 500 MG CAPS Take 500 mg by mouth in the morning.   aspirin 81 MG EC tablet Take 1 tablet (81 mg total) by mouth  daily.   azelastine (ASTELIN) 0.1 % nasal spray Place 2 sprays into both nostrils 2 (two) times daily.   calcium carbonate (OS-CAL - DOSED IN MG OF ELEMENTAL CALCIUM) 1250 (500 Ca) MG tablet Take 1 tablet by mouth.   Cholecalciferol (VITAMIN D3) 50 MCG (2000 UT) TABS Take 1,000 Units by mouth in the morning.   ezetimibe (ZETIA) 10 MG tablet Take 1 tablet (10 mg total) by mouth at bedtime.   fexofenadine (ALLEGRA) 180 MG tablet Take 180 mg by mouth daily as needed (allergies.).   fluticasone (FLONASE) 50 MCG/ACT nasal spray Place 1-2 sprays into both nostrils daily as needed for allergies.   hydrOXYzine (ATARAX) 10 MG tablet Take 1 tablet (10 mg total) by mouth at bedtime as needed (for anxiety / insomnia).   losartan (COZAAR) 50 MG tablet Take 1 tablet (50 mg total) by mouth daily.   metoprolol tartrate (LOPRESSOR) 25 MG tablet Take 1 tablet (25 mg total) by mouth 2 (two) times daily.   Multiple Vitamins-Minerals (CENTRUM SILVER PO) Take 1 tablet by mouth in the morning.   pantoprazole (PROTONIX) 20 MG tablet Take 20 mg by mouth daily as needed for heartburn or indigestion.   Probiotic Product (PROBIOTIC PO) Take 1 capsule by mouth in the morning.   rosuvastatin (CRESTOR) 10 MG tablet Take 1 tablet (10 mg total) by mouth daily.   vitamin B-12 (CYANOCOBALAMIN)  1000 MCG tablet Take 1,000 mcg by mouth in the morning.   Wheat Dextrin (BENEFIBER DRINK MIX) PACK Take 1 packet by mouth in the morning and at bedtime.   RSV vaccine recomb adjuvanted (AREXVY) 120 MCG/0.5ML injection Inject into the muscle.   [DISCONTINUED] methylPREDNISolone (MEDROL DOSEPAK) 4 MG TBPK tablet Please take per packaging instructions.   [DISCONTINUED] metoprolol tartrate (LOPRESSOR) 25 MG tablet Take 25 mg by mouth 2 (two) times daily.   No facility-administered encounter medications on file as of 04/18/2023.    Allergies (verified) E.e.s. [erythromycin] and Vibramycin [doxycycline]   History: Past Medical History:   Diagnosis Date   Adenomatous colon polyp 2018   Allergy    SEASONAL   Breast cancer (HCC)    Breast cancer in female [facility]) 02/2013   Right-sided breast cancer-s/p right mastectomy.  No chemotherapy or radiation.   Essential hypertension    Family history of breast cancer    Family history of colon cancer    GERD (gastroesophageal reflux disease)    Hyperlipidemia LDL goal <100    Documented coronary calcification with mild CAD.   Leukopenia    Neutropenia (HCC)    Pneumonia    Seasonal allergies    Past Surgical History:  Procedure Laterality Date   AUGMENTATION MAMMAPLASTY     COLONOSCOPY     LAPAROSCOPIC RIGHT HEMI COLECTOMY Right 05/18/2022   Procedure: LAPAROSCOPIC RIGHT HEMI COLECTOMY;  Surgeon: Andria Meuse, MD;  Location: [facility];  Service: General;  Laterality: Right;   LEFT HEART CATH AND CORONARY ANGIOGRAPHY  07/14/2020   [place], [place], [place] : no flow-limiting lesions.  Proximal LAD calcified lesion: FFR 0.95.  D1 to Strader bifurcation 50% stenosis.  Normal RCA and LCx.   MASTECTOMY Right 02/04/2013   NM MYOVIEW LTD  07/2020   Read as is "abnormal"-referred for cath that was normal.   PLACEMENT OF BREAST IMPLANTS Right 08/2013   POLYPECTOMY  05/18/2022   colon   TRANSTHORACIC ECHOCARDIOGRAM  2021   ([place], [place]) normal EF with no RWMA.   TUBAL LIGATION  1973   Reversed in 1981   Tubal ligation reversal  1981   Family History  Problem Relation Age of Onset   Colon cancer Mother 52   Cancer Maternal Uncle        NOS   Cancer Paternal Aunt        2 PAs with unknown cancer   Alzheimer's disease Maternal Grandmother 49   Lung cancer Maternal Grandfather    Colon cancer Paternal Grandmother    Cancer - Colon Paternal Grandmother    Diabetes Mellitus II Paternal Grandfather    Breast cancer Other        MGFs sister   Esophageal cancer Neg Hx    Rectal cancer Neg Hx    Stomach cancer Neg Hx    Social History   Socioeconomic  History   Marital status: Widowed    Spouse name: Not on file   Number of children: 3   Years of education: Not on file   Highest education level: Bachelor's degree (e.g., BA, AB, BS)  Occupational History   Not on file  Tobacco Use   Smoking status: Never    Passive exposure: Past   Smokeless tobacco: Never  Vaping Use   Vaping Use: Never used  Substance and Sexual Activity   Alcohol use: Yes    Alcohol/week: 1.0 standard drink of alcohol    Types: 1 Glasses of wine per week  Comment: 2 GLASSES PER WEEK   Drug use: Never   Sexual activity: Not Currently  Other Topics Concern   Not on file  Social History Narrative   Widowed mother of 3 (1 son, 2 daughters ->    1 daughter lives here in [place] area along with her granddaughter, other daughter lives in [place] -> when she left [place], she first went to [place], but Subsequently [employer] [place] to [place] to be closer to her granddaughter and daughter here locally.  Son also lives relatively close.      Caffeine: 2 servings daily-1 coffee, 1 herbal tea.      Diet: Minimizes red meat, eats lots of fruits and vegetables, chicken and fish.  Drinks lots of water (almost 80 ounces).      Exercise: Walks routinely 5 days a week, and started going to a dance class and exercise class.   Social Determinants of Health   Financial Resource Strain: Low Risk  (04/17/2023)   Overall Financial Resource Strain (CARDIA)    Difficulty of Paying Living Expenses: Not hard at all  Food Insecurity: No Food Insecurity (04/17/2023)   Hunger Vital Sign    Worried About Running Out of Food in the Last Year: Never true    Ran Out of Food in the Last Year: Never true  Transportation Needs: No Transportation Needs (04/17/2023)   PRAPARE - Administrator, Civil Service (Medical): No    Lack of Transportation (Non-Medical): No  Physical Activity: Insufficiently Active (04/17/2023)   Exercise Vital Sign    Days of Exercise  per Week: 3 days    Minutes of Exercise per Session: 20 min  Stress: No Stress Concern Present (04/17/2023)   [employer] of Occupational Health - Occupational Stress Questionnaire    Feeling of Stress : Only a little  Recent Concern: Stress - Stress Concern Present (04/04/2023)   [employer] of Occupational Health - Occupational Stress Questionnaire    Feeling of Stress : To some extent  Social Connections: Moderately Integrated (04/17/2023)   Social Connection and Isolation Panel [NHANES]    Frequency of Communication with Friends and Family: More than three times a week    Frequency of Social Gatherings with Friends and Family: Once a week    Attends Religious Services: More than 4 times per year    Active Member of [employer] or Organizations: Yes    Attends Banker Meetings: More than 4 times per year    Marital Status: Widowed    Tobacco Counseling Counseling given: Not Answered   Clinical Intake:  Pre-visit preparation completed: Yes  Pain : No/denies pain     BMI - recorded: 23.08 Nutritional Status: BMI of 19-24  Normal Nutritional Risks: None Diabetes: No  How often do you need to have someone help you when you read instructions, pamphlets, or other written materials from your doctor or pharmacy?: 2 - Rarely  Diabetic?no  Interpreter Needed?: No  Information entered by :: Lanier Ensign, LPN   Activities of Daily Living    04/17/2023   11:32 PM 05/18/2022    2:00 PM  In your present state of health, do you have any difficulty performing the following activities:  Hearing? 0 0  Vision? 0 0  Difficulty concentrating or making decisions? 0 0  Walking or climbing stairs? 0 0  Dressing or bathing? 0 0  Doing errands, shopping? 0 0  Preparing Food and eating ? N  Using the Toilet? N   In the past six months, have you accidently leaked urine? N   Do you have problems with loss of bowel control? N   Managing your Medications? N   Managing  your Finances? N   Housekeeping or managing your Housekeeping? N     Patient Care Team: Allwardt, Crist Infante, PA-C as PCP - General (Physician Assistant) Marykay Lex, MD as PCP - Cardiology (Cardiology)  Indicate any recent Medical Services you may have received from other than [facility] providers in the past year (date may be approximate).     Assessment:   This is a routine wellness examination for Caitlin Parks.  Hearing/Vision screen Hearing Screening - Comments:: Pt slight loss of hearing  Vision Screening - Comments:: [facility]   Dietary issues and exercise activities discussed: Current Exercise Habits: Home exercise routine, Type of exercise: walking, Time (Minutes): 20, Frequency (Times/Week): 3, Weekly Exercise (Minutes/Week): 60   Goals Addressed             This Visit's Progress    Patient Stated       Be more consistent with exercise        Depression Screen    04/18/2023    2:38 PM 03/30/2023   12:34 PM 03/29/2022   11:55 AM  PHQ 2/9 Scores  PHQ - 2 Score 0 0 0    Fall Risk    04/17/2023   11:32 PM 03/30/2023   12:34 PM 02/08/2023    9:03 AM 04/15/2022   11:09 AM 03/29/2022   11:56 AM  Fall Risk   Falls in the past year? 0 0 0 0 0  Number falls in past yr: 0 0 0 0 0  Injury with Fall?  0 0 0 0  Risk for fall due to : Impaired vision No Fall Risks No Fall Risks No Fall Risks Impaired vision  Follow up Falls prevention discussed Falls evaluation completed Falls evaluation completed Falls evaluation completed Falls prevention discussed    FALL RISK PREVENTION PERTAINING TO THE HOME:  Any stairs in or around the home? Yes  If so, are there any without handrails? No  Home free of loose throw rugs in walkways, pet beds, electrical cords, etc? Yes  Adequate lighting in your home to reduce risk of falls? Yes   ASSISTIVE DEVICES UTILIZED TO PREVENT FALLS:  Life alert? No  Use of a cane, walker or w/c? No  Grab bars  in the bathroom? No  Shower chair or bench in shower? No  Elevated toilet seat or a handicapped toilet? No   TIMED UP AND GO:  Was the test performed? No .  Cognitive Function:        04/18/2023    2:42 PM 03/29/2022   11:58 AM  6CIT Screen  What Year? 0 points 0 points  What month? 0 points 0 points  What time? 0 points 0 points  Count back from 20 0 points 0 points  Months in reverse 0 points 0 points  Repeat phrase 0 points 0 points  Total Score 0 points 0 points    Immunizations Immunization History  Administered Date(s) Administered   Influenza Split 12/11/2007, 09/08/2008, 01/11/2010, 09/22/2011, 08/17/2012, 09/18/2013   Influenza,inj,quad, With Preservative 08/16/2012   Influenza-Unspecified 09/06/2022   Moderna Covid-19 Vaccine Bivalent Booster 4yrs & up 09/05/2021, 09/20/2022   Moderna Sars-Covid-2 Vaccination 01/04/2020, 02/01/2020   Pneumococcal Conjugate-13 09/24/2014, 12/07/2017   Pneumococcal Polysaccharide-23  01/13/2012   Respiratory Syncytial Virus Vaccine,Recomb Aduvanted(Arexvy) 10/19/2022   Td (Adult),5 Lf Tetanus Toxid, Preservative Free 09/05/1999   Zoster Recombinat (Shingrix) 06/13/2019, 08/19/2019    TDAP status: Up to date  Flu Vaccine status: Up to date  Pneumococcal vaccine status: Up to date  Covid-19 vaccine status: Completed vaccines  Qualifies for Shingles Vaccine? Yes   Zostavax completed Yes   Shingrix Completed?: Yes  Screening Tests Health Maintenance  Topic Date Due   DTaP/Tdap/Td (1 - Tdap) 09/06/1999   COVID-19 Vaccine (5 - 2023-24 season) 07/16/2023 (Originally 11/15/2022)   Hepatitis C Screening  09/23/2023 (Originally 12/01/1964)   INFLUENZA VACCINE  07/06/2023   Medicare Annual Wellness (AWV)  04/17/2024   COLONOSCOPY (Pts 45-58yrs Insurance coverage will need to be confirmed)  03/01/2025   Pneumonia Vaccine 59+ Years old  Completed   DEXA SCAN  Completed   Zoster Vaccines- Shingrix  Completed   HPV VACCINES  Aged  Out    Health Maintenance  Health Maintenance Due  Topic Date Due   DTaP/Tdap/Td (1 - Tdap) 09/06/1999    Colorectal cancer screening: Type of screening: Colonoscopy. Completed 03/01/22. Repeat every 3 years  Mammogram status: Completed 07/19/22. Repeat every year  Bone Density status: Completed 06/09/22. Results reflect: Bone density results: OSTEOPENIA. Repeat every 2 years.  Additional Screening:  Hepatitis C Screening: does qualify  Vision Screening: Recommended annual ophthalmology exams for early detection of glaucoma and other disorders of the eye. Is the patient up to date with their annual eye exam?  Yes  Who is the provider or what is the name of the office in which the patient attends annual eye exams? [facility]  If pt is not established with a provider, would they like to be referred to a provider to establish care? No .   Dental Screening: Recommended annual dental exams for proper oral hygiene  Community Resource Referral / Chronic Care Management: CRR required this visit?  No   CCM required this visit?  No      Plan:     I have personally reviewed and noted the following in the patient's chart:   Medical and social history Use of alcohol, tobacco or illicit drugs  Current medications and supplements including opioid prescriptions. Patient is not currently taking opioid prescriptions. Functional ability and status Nutritional status Physical activity Advanced directives List of other physicians Hospitalizations, surgeries, and ER visits in previous 12 months Vitals Screenings to include cognitive, depression, and falls Referrals and appointments  In addition, I have reviewed and discussed with patient certain preventive protocols, quality metrics, and best practice recommendations. A written personalized care plan for preventive services as well as general preventive health recommendations were provided to patient.     Marzella Schlein,  LPN   08/18/7828   Nurse Notes: none

## 2023-04-18 NOTE — Patient Instructions (Signed)
Caitlin Parks , Thank you for taking time to come for your Medicare Wellness Visit. I appreciate your ongoing commitment to your health goals. Please review the following plan we discussed and let me know if I can assist you in the future.   These are the goals we discussed:  Goals      Patient Stated     Eat healthy maintain and lose weight and exercise     Patient Stated     Be more consistent with exercise         This is a list of the screening recommended for you and due dates:  Health Maintenance  Topic Date Due   DTaP/Tdap/Td vaccine (1 - Tdap) 09/06/1999   COVID-19 Vaccine (5 - 2023-24 season) 07/16/2023*   Hepatitis C Screening: USPSTF Recommendation to screen - Ages 18-79 yo.  09/23/2023*   Flu Shot  07/06/2023   Medicare Annual Wellness Visit  04/17/2024   Colon Cancer Screening  03/01/2025   Pneumonia Vaccine  Completed   DEXA scan (bone density measurement)  Completed   Zoster (Shingles) Vaccine  Completed   HPV Vaccine  Aged Out  *Topic was postponed. The date shown is not the original due date.    Advanced directives: Please bring a copy of your health care power of attorney and living will to the office at your convenience.  Conditions/risks identified: be more consistent with exercise    Next appointment: Follow up in one year for your annual wellness visit    Preventive Care 65 Years and Older, Female Preventive care refers to lifestyle choices and visits with your health care provider that can promote health and wellness. What does preventive care include? A yearly physical exam. This is also called an annual well check. Dental exams once or twice a year. Routine eye exams. Ask your health care provider how often you should have your eyes checked. Personal lifestyle choices, including: Daily care of your teeth and gums. Regular physical activity. Eating a healthy diet. Avoiding tobacco and drug use. Limiting alcohol use. Practicing safe  sex. Taking low-dose aspirin every day. Taking vitamin and mineral supplements as recommended by your health care provider. What happens during an annual well check? The services and screenings done by your health care provider during your annual well check will depend on your age, overall health, lifestyle risk factors, and family history of disease. Counseling  Your health care provider may ask you questions about your: Alcohol use. Tobacco use. Drug use. Emotional well-being. Home and relationship well-being. Sexual activity. Eating habits. History of falls. Memory and ability to understand (cognition). Work and work Astronomer. Reproductive health. Screening  You may have the following tests or measurements: Height, weight, and BMI. Blood pressure. Lipid and cholesterol levels. These may be checked every 5 years, or more frequently if you are over 66 years old. Skin check. Lung cancer screening. You may have this screening every year starting at age 46 if you have a 30-pack-year history of smoking and currently smoke or have quit within the past 15 years. Fecal occult blood test (FOBT) of the stool. You may have this test every year starting at age 8. Flexible sigmoidoscopy or colonoscopy. You may have a sigmoidoscopy every 5 years or a colonoscopy every 10 years starting at age 80. Hepatitis C blood test. Hepatitis B blood test. Sexually transmitted disease (STD) testing. Diabetes screening. This is done by checking your blood sugar (glucose) after you have not eaten for a while (fasting).  You may have this done every 1-3 years. Bone density scan. This is done to screen for osteoporosis. You may have this done starting at age 48. Mammogram. This may be done every 1-2 years. Talk to your health care provider about how often you should have regular mammograms. Talk with your health care provider about your test results, treatment options, and if necessary, the need for more  tests. Vaccines  Your health care provider may recommend certain vaccines, such as: Influenza vaccine. This is recommended every year. Tetanus, diphtheria, and acellular pertussis (Tdap, Td) vaccine. You may need a Td booster every 10 years. Zoster vaccine. You may need this after age 10. Pneumococcal 13-valent conjugate (PCV13) vaccine. One dose is recommended after age 40. Pneumococcal polysaccharide (PPSV23) vaccine. One dose is recommended after age 8. Talk to your health care provider about which screenings and vaccines you need and how often you need them. This information is not intended to replace advice given to you by your health care provider. Make sure you discuss any questions you have with your health care provider. Document Released: 12/18/2015 Document Revised: 08/10/2016 Document Reviewed: 09/22/2015 Elsevier Interactive Patient Education  2017 [place] Prevention in the Home Falls can cause injuries. They can happen to people of all ages. There are many things you can do to make your home safe and to help prevent falls. What can I do on the outside of my home? Regularly fix the edges of walkways and driveways and fix any cracks. Remove anything that might make you trip as you walk through a door, such as a raised step or threshold. Trim any bushes or trees on the path to your home. Use bright outdoor lighting. Clear any walking paths of anything that might make someone trip, such as rocks or tools. Regularly check to see if handrails are loose or broken. Make sure that both sides of any steps have handrails. Any raised decks and porches should have guardrails on the edges. Have any leaves, snow, or ice cleared regularly. Use sand or salt on walking paths during winter. Clean up any spills in your garage right away. This includes oil or grease spills. What can I do in the bathroom? Use night lights. Install grab bars by the toilet and in the tub and shower.  Do not use towel bars as grab bars. Use non-skid mats or decals in the tub or shower. If you need to sit down in the shower, use a plastic, non-slip stool. Keep the floor dry. Clean up any water that spills on the floor as soon as it happens. Remove soap buildup in the tub or shower regularly. Attach bath mats securely with double-sided non-slip rug tape. Do not have throw rugs and other things on the floor that can make you trip. What can I do in the bedroom? Use night lights. Make sure that you have a light by your bed that is easy to reach. Do not use any sheets or blankets that are too big for your bed. They should not hang down onto the floor. Have a firm chair that has side arms. You can use this for support while you get dressed. Do not have throw rugs and other things on the floor that can make you trip. What can I do in the kitchen? Clean up any spills right away. Avoid walking on wet floors. Keep items that you use a lot in easy-to-reach places. If you need to reach something above you, use a  strong step stool that has a grab bar. Keep electrical cords out of the way. Do not use floor polish or wax that makes floors slippery. If you must use wax, use non-skid floor wax. Do not have throw rugs and other things on the floor that can make you trip. What can I do with my stairs? Do not leave any items on the stairs. Make sure that there are handrails on both sides of the stairs and use them. Fix handrails that are broken or loose. Make sure that handrails are as long as the stairways. Check any carpeting to make sure that it is firmly attached to the stairs. Fix any carpet that is loose or worn. Avoid having throw rugs at the top or bottom of the stairs. If you do have throw rugs, attach them to the floor with carpet tape. Make sure that you have a light switch at the top of the stairs and the bottom of the stairs. If you do not have them, ask someone to add them for you. What else  can I do to help prevent falls? Wear shoes that: Do not have high heels. Have rubber bottoms. Are comfortable and fit you well. Are closed at the toe. Do not wear sandals. If you use a stepladder: Make sure that it is fully opened. Do not climb a closed stepladder. Make sure that both sides of the stepladder are locked into place. Ask someone to hold it for you, if possible. Clearly mark and make sure that you can see: Any grab bars or handrails. First and last steps. Where the edge of each step is. Use tools that help you move around (mobility aids) if they are needed. These include: Canes. Walkers. Scooters. Crutches. Turn on the lights when you go into a dark area. Replace any light bulbs as soon as they burn out. Set up your furniture so you have a clear path. Avoid moving your furniture around. If any of your floors are uneven, fix them. If there are any pets around you, be aware of where they are. Review your medicines with your doctor. Some medicines can make you feel dizzy. This can increase your chance of falling. Ask your doctor what other things that you can do to help prevent falls. This information is not intended to replace advice given to you by your health care provider. Make sure you discuss any questions you have with your health care provider. Document Released: 09/17/2009 Document Revised: 04/28/2016 Document Reviewed: 12/26/2014 Elsevier Interactive Patient Education  2017 [employer].

## 2023-04-20 ENCOUNTER — Other Ambulatory Visit: Payer: Medicare Other

## 2023-04-20 ENCOUNTER — Ambulatory Visit: Payer: Medicare Other | Admitting: Oncology

## 2023-04-24 ENCOUNTER — Ambulatory Visit: Payer: Medicare Other | Attending: Sports Medicine | Admitting: Physical Therapy

## 2023-04-24 ENCOUNTER — Encounter: Payer: Self-pay | Admitting: Physical Therapy

## 2023-04-24 ENCOUNTER — Other Ambulatory Visit: Payer: Self-pay

## 2023-04-24 DIAGNOSIS — M542 Cervicalgia: Secondary | ICD-10-CM | POA: Diagnosis not present

## 2023-04-24 DIAGNOSIS — M6281 Muscle weakness (generalized): Secondary | ICD-10-CM

## 2023-04-24 DIAGNOSIS — M25512 Pain in left shoulder: Secondary | ICD-10-CM | POA: Insufficient documentation

## 2023-04-24 DIAGNOSIS — M546 Pain in thoracic spine: Secondary | ICD-10-CM | POA: Insufficient documentation

## 2023-04-24 DIAGNOSIS — R293 Abnormal posture: Secondary | ICD-10-CM | POA: Diagnosis not present
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
## 2023-04-25 ENCOUNTER — Ambulatory Visit: Payer: Medicare Other

## 2023-04-25 DIAGNOSIS — M546 Pain in thoracic spine: Secondary | ICD-10-CM | POA: Diagnosis not present

## 2023-04-25 DIAGNOSIS — M542 Cervicalgia: Secondary | ICD-10-CM | POA: Diagnosis not present

## 2023-04-25 DIAGNOSIS — M25512 Pain in left shoulder: Secondary | ICD-10-CM

## 2023-04-25 DIAGNOSIS — M6281 Muscle weakness (generalized): Secondary | ICD-10-CM

## 2023-04-25 DIAGNOSIS — R293 Abnormal posture: Secondary | ICD-10-CM | POA: Diagnosis not present
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
## 2023-05-02 ENCOUNTER — Other Ambulatory Visit: Payer: Self-pay | Admitting: Physician Assistant

## 2023-05-02 ENCOUNTER — Ambulatory Visit: Payer: Medicare Other

## 2023-05-02 ENCOUNTER — Ambulatory Visit: Payer: Medicare Other | Admitting: Physician Assistant

## 2023-05-02 DIAGNOSIS — M546 Pain in thoracic spine: Secondary | ICD-10-CM

## 2023-05-02 DIAGNOSIS — M6281 Muscle weakness (generalized): Secondary | ICD-10-CM

## 2023-05-02 DIAGNOSIS — R293 Abnormal posture: Secondary | ICD-10-CM

## 2023-05-02 DIAGNOSIS — M542 Cervicalgia: Secondary | ICD-10-CM

## 2023-05-02 DIAGNOSIS — M25512 Pain in left shoulder: Secondary | ICD-10-CM | POA: Diagnosis not present

## 2023-05-02 DIAGNOSIS — Z1231 Encounter for screening mammogram for malignant neoplasm of breast: Secondary | ICD-10-CM

## 2023-05-02 NOTE — Progress Notes (Signed)
Caitlin Parks D.[facility] [address] [place] [address] Phone: [phone]   Assessment and Plan:     1. Neck pain 2. DDD (degenerative disc disease), cervical  -Chronic with exacerbation, subsequent visit - Overall improvement in neck range of motion and decrease in neck pain after starting home exercise program, physical therapy and using Tylenol as needed for day-to-day pain relief - May continue Tylenol as needed for day-to-day pain relief - Recommend continuing HEP, physical therapy, and dry needling  Pertinent previous records reviewed include physical therapy note 04/24/2023, physical therapy note 04/25/2023, physical therapy note 05/02/2023   Follow Up: As needed.  Patient is moving to [place] at the end of the month, so she can establish care locally and may continue physical therapy locally after moving   Subjective:   I, Caitlin Parks, am serving as a Neurosurgeon for Doctor Richardean Sale   Chief Complaint: neck and back pain    HPI:    04/11/2023 Patient is a 77 year old female complaining of neck and back pain. Patient states Left side of neck still "just hurts", goes into the base of her head and into proximal shoulder. Still having aching pain left arm. Pain started a month ago, she was doing chair yoga, was rx prednisone and that helped , no numbness or tingling, decreased ROM due to pain  -feels lightheaded, somewhat nauseated -dull headache diffuse right now -pounding, pulling pain around top crown of her headworse at night, worse in posterior part of head -no hx of HA's or migraines   Chair yoga with light weights - twisted to the left and apparently pulled muscles on her whole left side. [place] Spaniel it went from the top of her head to her feet. Neck feels fine now.    05/09/2023 Patient states she can tell a little difference she feels she getting stronger       Relevant Historical Information: Hypertension,    Additional  pertinent review of systems negative.   Current Outpatient Medications:    Ascorbic Acid (VITAMIN C) 500 MG CAPS, Take 500 mg by mouth in the morning., Disp: , Rfl:    aspirin 81 MG EC tablet, Take 1 tablet (81 mg total) by mouth daily., Disp: 30 tablet, Rfl: 11   azelastine (ASTELIN) 0.1 % nasal spray, Place 2 sprays into both nostrils 2 (two) times daily., Disp: 30 mL, Rfl: 12   calcium carbonate (OS-CAL - DOSED IN MG OF ELEMENTAL CALCIUM) 1250 (500 Ca) MG tablet, Take 1 tablet by mouth., Disp: , Rfl:    Cholecalciferol (VITAMIN D3) 50 MCG (2000 UT) TABS, Take 1,000 Units by mouth in the morning., Disp: , Rfl:    ezetimibe (ZETIA) 10 MG tablet, Take 1 tablet (10 mg total) by mouth at bedtime., Disp: 90 tablet, Rfl: 3   fexofenadine (ALLEGRA) 180 MG tablet, Take 180 mg by mouth daily as needed (allergies.)., Disp: , Rfl:    fluticasone (FLONASE) 50 MCG/ACT nasal spray, Place 1-2 sprays into both nostrils daily as needed for allergies., Disp: , Rfl:    hydrOXYzine (ATARAX) 10 MG tablet, Take 1 tablet (10 mg total) by mouth at bedtime as needed (for anxiety / insomnia)., Disp: 10 tablet, Rfl: 1   losartan (COZAAR) 50 MG tablet, Take 1 tablet (50 mg total) by mouth daily., Disp: 90 tablet, Rfl: 3   metoprolol tartrate (LOPRESSOR) 25 MG tablet, Take 1 tablet (25 mg total) by mouth 2 (two) times daily., Disp: 180  tablet, Rfl: 3   Multiple Vitamins-Minerals (CENTRUM SILVER PO), Take 1 tablet by mouth in the morning., Disp: , Rfl:    pantoprazole (PROTONIX) 20 MG tablet, Take 20 mg by mouth daily as needed for heartburn or indigestion., Disp: , Rfl:    Probiotic Product (PROBIOTIC PO), Take 1 capsule by mouth in the morning., Disp: , Rfl:    rosuvastatin (CRESTOR) 10 MG tablet, Take 1 tablet (10 mg total) by mouth daily., Disp: 90 tablet, Rfl: 3   RSV vaccine recomb adjuvanted (AREXVY) 120 MCG/0.5ML injection, Inject into the muscle., Disp: 0.5 mL, Rfl: 0   vitamin B-12 (CYANOCOBALAMIN) 1000 MCG  tablet, Take 1,000 mcg by mouth in the morning., Disp: , Rfl:    Wheat Dextrin (BENEFIBER DRINK MIX) PACK, Take 1 packet by mouth in the morning and at bedtime., Disp: , Rfl:    Objective:     Vitals:   05/09/23 1059  BP: 122/82  Pulse: 78  SpO2: 98%  Weight: 143 lb (64.9 kg)  Height: 5\' 6"  (1.676 m)      Body mass index is 23.08 kg/m.    Physical Exam:    Neck Exam: Cervical Spine- Posture normal Skin- normal, intact   Neuro:  Strength-   Right Left  Deltoid (C5) 5/5 5/5 Bicep/Brachioradialis (C5/6) 5/5  5/5 Wrist Extension (C6) 5/5 5/5 Tricep (C7) 5/5 5/5 Wrist Flexion (C7) 5/5 5/5 Grip (C8) 5/5 5/5 Finger Abduction (T1) 5/5 5/5      Spurling's:  negative bilaterally Neck ROM: Moderately reduced sidebending, left rotation, extension TTP: Left cervical paraspinal NTTP: cervical spinous processes, right cervical paraspinal, thoracic paraspinal, trapezius     Electronically signed by:  Caitlin Parks D.[facility] 11:14 AM 05/09/23
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
## 2023-05-09 ENCOUNTER — Ambulatory Visit: Payer: Medicare Other | Admitting: Sports Medicine

## 2023-05-09 VITALS — BP 122/82 | HR 78 | Ht 66.0 in | Wt 143.0 lb

## 2023-05-09 DIAGNOSIS — M542 Cervicalgia: Secondary | ICD-10-CM

## 2023-05-09 DIAGNOSIS — M5030 Other cervical disc degeneration, unspecified cervical region: Secondary | ICD-10-CM

## 2023-05-11 ENCOUNTER — Inpatient Hospital Stay: Payer: Medicare Other | Attending: Oncology

## 2023-05-11 ENCOUNTER — Inpatient Hospital Stay: Payer: Medicare Other | Admitting: Oncology

## 2023-05-11 VITALS — BP 136/68 | HR 62 | Temp 98.1°F | Resp 18 | Wt 144.7 lb

## 2023-05-11 DIAGNOSIS — Z853 Personal history of malignant neoplasm of breast: Secondary | ICD-10-CM | POA: Insufficient documentation

## 2023-05-11 DIAGNOSIS — D7589 Other specified diseases of blood and blood-forming organs: Secondary | ICD-10-CM | POA: Insufficient documentation

## 2023-05-11 DIAGNOSIS — C50919 Malignant neoplasm of unspecified site of unspecified female breast: Secondary | ICD-10-CM

## 2023-05-11 DIAGNOSIS — M8580 Other specified disorders of bone density and structure, unspecified site: Secondary | ICD-10-CM | POA: Insufficient documentation

## 2023-05-11 DIAGNOSIS — D72819 Decreased white blood cell count, unspecified: Secondary | ICD-10-CM | POA: Insufficient documentation

## 2023-05-11 DIAGNOSIS — I10 Essential (primary) hypertension: Secondary | ICD-10-CM | POA: Diagnosis not present

## 2023-05-11 DIAGNOSIS — I2510 Atherosclerotic heart disease of native coronary artery without angina pectoris: Secondary | ICD-10-CM | POA: Insufficient documentation

## 2023-05-11 DIAGNOSIS — Z9011 Acquired absence of right breast and nipple: Secondary | ICD-10-CM | POA: Insufficient documentation

## 2023-05-11 LAB — CBC WITH DIFFERENTIAL (CANCER CENTER ONLY)
Abs Immature Granulocytes: 0.01 10*3/uL (ref 0.00–0.07)
Basophils Absolute: 0 10*3/uL (ref 0.0–0.1)
Basophils Relative: 0 %
Eosinophils Absolute: 0 10*3/uL (ref 0.0–0.5)
Eosinophils Relative: 1 %
HCT: 37.5 % (ref 36.0–46.0)
Hemoglobin: 12.8 g/dL (ref 12.0–15.0)
Immature Granulocytes: 0 %
Lymphocytes Relative: 34 %
Lymphs Abs: 0.9 10*3/uL (ref 0.7–4.0)
MCH: 36.2 pg — ABNORMAL HIGH (ref 26.0–34.0)
MCHC: 34.1 g/dL (ref 30.0–36.0)
MCV: 105.9 fL — ABNORMAL HIGH (ref 80.0–100.0)
Monocytes Absolute: 0.3 10*3/uL (ref 0.1–1.0)
Monocytes Relative: 10 %
Neutro Abs: 1.5 10*3/uL — ABNORMAL LOW (ref 1.7–7.7)
Neutrophils Relative %: 55 %
Platelet Count: 156 10*3/uL (ref 150–400)
RBC: 3.54 MIL/uL — ABNORMAL LOW (ref 3.87–5.11)
RDW: 13.2 % (ref 11.5–15.5)
WBC Count: 2.7 10*3/uL — ABNORMAL LOW (ref 4.0–10.5)
nRBC: 0 % (ref 0.0–0.2)

## 2023-05-11 NOTE — Progress Notes (Signed)
[facility] OFFICE PROGRESS NOTE   Diagnosis: Leukopenia, Red cell macrocytosis  INTERVAL HISTORY:   Caitlin Parks returns as scheduled.  She feels well.  She plans to relocate to [place] later this month.  Objective:  Vital signs in last 24 hours:  Blood pressure 136/68, pulse 62, temperature 98.1 F (36.7 C), temperature source Tympanic, resp. rate 18, weight 144 lb 11.2 oz (65.6 kg), SpO2 100 %.     Lymphatics: No cervical, supraclavicular, axillary, or inguinal nodes Resp: End inspiratory coarse rhonchi at the posterior chest bilaterally, no respiratory distress Cardio: Regular rate and rhythm GI: No hepatosplenomegaly Vascular: No leg edema Breast: Status post right mastectomy with an implant in place.  No evidence for chest wall tumor recurrence.   Lab Results:  Lab Results  Component Value Date   WBC 2.7 (L) 05/11/2023   HGB 12.8 05/11/2023   HCT 37.5 05/11/2023   MCV 105.9 (H) 05/11/2023   PLT 156 05/11/2023   NEUTROABS 1.5 (L) 05/11/2023    CMP  Lab Results  Component Value Date   NA 141 03/30/2023   K 4.1 03/30/2023   CL 105 03/30/2023   CO2 29 03/30/2023   GLUCOSE 87 03/30/2023   BUN 11 03/30/2023   CREATININE 0.80 03/30/2023   CALCIUM 10.0 03/30/2023   PROT 6.9 03/30/2023   ALBUMIN 4.2 03/30/2023   AST 21 03/30/2023   ALT 23 03/30/2023   ALKPHOS 58 03/30/2023   BILITOT 0.4 03/30/2023   GFRNONAA >60 05/23/2022    Medications: I have reviewed the patient's current medications.   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   Right breast cancer 12/17/2012-diagnostic mammogram with right breast ultrasound, 3.3 x 3.9 cm area of malignant linear calcifications upper outer quadrant 01/11/2013-right breast ear tactic biopsy, DCIS, grade 3, ER positive (5), PR negative 01/24/2013-MRI bilateral breasts-clumped enhancement surrounding the marking clip in the right  breast measuring 4.5 x 2.1 x 1.2 cm, left breast negative, axillae negative 02/14/2013-right breast skin sparing mastectomy, DCIS, 1.4 cm, negative margins, negative sentinel lymph node 08/24/2013-exchange of right tissue expander for permanent implant 04/10/2013-May 2019-exemestane 4.  Osteopenia 5.  Hypertension 6.  History of CAD 7.  History of hypercalcemia 8.  History of a colon polyp-tubular adenoma 09/07/2017 9.  Colonoscopy 03/01/2022-four 1 to 2 mm polyps in the transverse colon and in the ascending colon (tubular adenoma in 3 of 6 fragments, benign colonic mucosa in 3 of 6 fragments, no high-grade dysplasia or malignancy); two 2 to 3 mm polyps in the cecum (tubular adenoma 1 of 2 fragments, benign colonic mucosa 1 of 2 fragments, no high-grade dysplasia or malignancy); one 20 mm polyp at the ileocecal valve (focal high-grade dysplasia arising in a tubular adenoma, no invasive carcinoma identified) 10.  05/18/2022 laparoscopic right hemicolectomy-tubular adenoma with focal high-grade dysplasia completely resected, proximal and distal resection margins negative for adenomatous change, regional lymph nodes negative for malignancy    Disposition: Caitlin Parks is stable from a hematologic standpoint.  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  The hemoglobin F was mildly elevated last year.  This is of unknown significance.  The leukopenia could be a benign normal variant.  There was no evidence for hemolysis when we saw her in February 2023.  She plans to relocate to [place] later this month.  She will schedule a follow-up visit with her previous  medical oncologist there for continued follow-up of the hematologic findings.  She will continue yearly mammography.  She will remain up-to-date on vaccines.  She will seek medical attention for symptoms of an infection.  We are available to see her as needed.  Thornton Papas, MD  05/11/2023  12:07 PM

## 2023-05-12 ENCOUNTER — Ambulatory Visit: Payer: Medicare Other | Admitting: Internal Medicine

## 2023-05-12 ENCOUNTER — Ambulatory Visit: Payer: Medicare Other | Admitting: Physical Therapy

## 2023-05-14 NOTE — Progress Notes (Unsigned)
Cardiology Clinic Note   Patient Name: Caitlin Parks Date of Encounter: 05/14/2023  Primary Care Provider:  Allwardt, Crist Infante, PA-C Primary Cardiologist:  Bryan Lemma, MD  Patient Profile    Caitlin Parks 77 year old female presents to the clinic today for follow-up evaluation of her essential hypertension and coronary artery disease.  Past Medical History    Past Medical History:  Diagnosis Date   Adenomatous colon polyp 2018   Allergy    SEASONAL   Breast cancer (HCC)    Breast cancer in female [facility]) 02/2013   Right-sided breast cancer-s/p right mastectomy.  No chemotherapy or radiation.   Essential hypertension    Family history of breast cancer    Family history of colon cancer    GERD (gastroesophageal reflux disease)    Hyperlipidemia LDL goal <100    Documented coronary calcification with mild CAD.   Leukopenia    Neutropenia (HCC)    Pneumonia    Seasonal allergies    Past Surgical History:  Procedure Laterality Date   AUGMENTATION MAMMAPLASTY     COLONOSCOPY     LAPAROSCOPIC RIGHT HEMI COLECTOMY Right 05/18/2022   Procedure: LAPAROSCOPIC RIGHT HEMI COLECTOMY;  Surgeon: Andria Meuse, MD;  Location: [facility];  Service: General;  Laterality: Right;   LEFT HEART CATH AND CORONARY ANGIOGRAPHY  07/14/2020   [place], [place], [place] : no flow-limiting lesions.  Proximal LAD calcified lesion: FFR 0.95.  D1 to Strader bifurcation 50% stenosis.  Normal RCA and LCx.   MASTECTOMY Right 02/04/2013   NM MYOVIEW LTD  07/2020   Read as is "abnormal"-referred for cath that was normal.   PLACEMENT OF BREAST IMPLANTS Right 08/2013   POLYPECTOMY  05/18/2022   colon   TRANSTHORACIC ECHOCARDIOGRAM  2021   ([place], [place]) normal EF with no RWMA.   TUBAL LIGATION  1973   Reversed in 1981   Tubal ligation reversal  1981    Allergies  Allergies  Allergen Reactions   E.E.S. [Erythromycin] Diarrhea   Vibramycin [Doxycycline] Rash and Other (See  Comments)    History of Present Illness    Caitlin Parks has a PMH of essential hypertension, coronary artery disease, colon polyp, status post right hemicolectomy, hyperlipidemia, GERD, and neutropenia.  She moved from [place] to [facility] 7/22.  She was previously followed by cardiology (Dr. Louanne Skye).  She underwent cardiac catheterization in [place] which showed mild nonobstructive CAD with 50% D1 lesion.  She was seen by Dr. Herbie Baltimore 3/23 and was doing well at that time.  She underwent laparoscopic right hemicolectomy by Dr. Cliffton Asters 05/18/2022.  Postoperatively she developed some melena.  Her hemoglobin remained stable.  She developed postoperative ileus which took 3-4 days to resolve.  She was noted to have an abnormal EKG.  She was seen by Azalee Course PA-C 06/20/2022.  She reported that she was told by preop staff 11/22 showed possible heart attack.  EKG at that time was reviewed and showed open "cannot rule out anterior infarct" due to poor R wave progression in anterior leads.  It was explained to the patient that it was a nonspecific finding.  Her June 23 EKG was reviewed and showed T wave inversion throughout the entire anterior leads.  EKG 06/20/2022 showed normal sinus rhythm with no ectopy.  She denied chest discomfort and worsening dyspnea.  No additional evaluation was felt to be warranted at that time.  Follow-up was planned for November.  She presented to the clinic 12/21/2022 for follow-up  evaluation and stated she felt well.  She tolerated her surgery well in June.  She did note some continued fatigue/decreased endurance postoperatively.  She reported that she was able to walk daily for about 15 minutes with her dog without difficulty.  She did have some difficulty with walking on the treadmill.  She reported some dizziness with changing head positions.  We reviewed her current medications.  Her blood pressure was well-controlled.  Her EKG today showed sinus rhythm with  fusion complexes 59 bpm.  I gave her Epley maneuvers, continue to current medications.   follow-up in 9 to 12 months was planned.  She presents to the clinic today for follow-up evaluation and states***.  Today she denies chest pain, shortness of breath, lower extremity edema, fatigue, palpitations, melena, hematuria, hemoptysis, diaphoresis, weakness, presyncope, syncope, orthopnea, and PND.   Home Medications    Prior to Admission medications   Medication Sig Start Date End Date Taking? Authorizing Provider  amoxicillin-clavulanate (AUGMENTIN) 875-125 MG tablet Take 1 tablet by mouth 2 (two) times daily. 10/28/22   Delorse Lek, FNP  Ascorbic Acid (VITAMIN C) 500 MG CAPS Take 500 mg by mouth in the morning.    [provider]  aspirin 81 MG EC tablet Take 1 tablet (81 mg total) by mouth daily. 10/18/21   Marykay Lex, MD  benzonatate (TESSALON) 200 MG capsule Take 1 capsule (200 mg total) by mouth 2 (two) times daily as needed for cough. 10/28/22   Delorse Lek, FNP  calcium carbonate (OS-CAL - DOSED IN MG OF ELEMENTAL CALCIUM) 1250 (500 Ca) MG tablet Take 1 tablet by mouth.    [provider]  Cholecalciferol (VITAMIN D3) 50 MCG (2000 UT) TABS Take 1,000 Units by mouth in the morning.    [provider]  ezetimibe (ZETIA) 10 MG tablet Take 1 tablet (10 mg total) by mouth at bedtime. 11/15/22   Marykay Lex, MD  fexofenadine (ALLEGRA) 180 MG tablet Take 180 mg by mouth daily as needed (allergies.).    [provider]  fluticasone (FLONASE) 50 MCG/ACT nasal spray Place 1-2 sprays into both nostrils daily as needed for allergies.    [provider]  losartan (COZAAR) 50 MG tablet Take 1 tablet (50 mg total) by mouth daily. 11/15/22   Marykay Lex, MD  metoprolol tartrate (LOPRESSOR) 25 MG tablet Take 1 tablet (25 mg total) by mouth 2 (two) times daily. 12/09/22   Marykay Lex, MD  Multiple Vitamins-Minerals (CENTRUM SILVER PO) Take  1 tablet by mouth in the morning.    [provider]  pantoprazole (PROTONIX) 20 MG tablet Take 20 mg by mouth daily as needed for heartburn or indigestion.    [provider]  Probiotic Product (PROBIOTIC PO) Take 1 capsule by mouth in the morning.    [provider]  rosuvastatin (CRESTOR) 10 MG tablet Take 1 tablet (10 mg total) by mouth daily. 12/09/22   Marykay Lex, MD  RSV vaccine recomb adjuvanted (AREXVY) 120 MCG/0.5ML injection Inject into the muscle. 10/19/22   Judyann Munson, MD  vitamin B-12 (CYANOCOBALAMIN) 1000 MCG tablet Take 1,000 mcg by mouth in the morning.    [provider]  Wheat Dextrin (BENEFIBER DRINK MIX) PACK Take 1 packet by mouth in the morning and at bedtime.    [provider]  metoprolol tartrate (LOPRESSOR) 25 MG tablet Take 25 mg by mouth 2 (two) times daily.    [provider]  Family History    Family History  Problem Relation Age of Onset   Colon cancer Mother 53   Cancer Maternal Uncle        NOS   Cancer Paternal Aunt        2 PAs with unknown cancer   Alzheimer's disease Maternal Grandmother 80   Lung cancer Maternal Grandfather    Colon cancer Paternal Grandmother    Cancer - Colon Paternal Grandmother    Diabetes Mellitus II Paternal Grandfather    Breast cancer Other        MGFs sister   Esophageal cancer Neg Hx    Rectal cancer Neg Hx    Stomach cancer Neg Hx    She indicated that her mother is deceased. She indicated that her father is deceased. She indicated that her maternal grandmother is deceased. She indicated that her maternal grandfather is deceased. She indicated that her paternal grandmother is deceased. She indicated that her paternal grandfather is deceased. She indicated that her maternal aunt is deceased. She indicated that both of her maternal uncles are deceased. She indicated that her paternal aunt is deceased. She indicated that the status of her paternal uncle is  unknown. She indicated that the status of her neg hx is unknown. She indicated that her other is deceased.  Social History    Social History   Socioeconomic History   Marital status: Widowed    Spouse name: Not on file   Number of children: 3   Years of education: Not on file   Highest education level: Bachelor's degree (e.g., BA, AB, BS)  Occupational History   Not on file  Tobacco Use   Smoking status: Never    Passive exposure: Past   Smokeless tobacco: Never  Vaping Use   Vaping Use: Never used  Substance and Sexual Activity   Alcohol use: Yes    Alcohol/week: 1.0 standard drink of alcohol    Types: 1 Glasses of wine per week    Comment: 2 GLASSES PER WEEK   Drug use: Never   Sexual activity: Not Currently  Other Topics Concern   Not on file  Social History Narrative   Widowed mother of 3 (1 son, 2 daughters ->    1 daughter lives here in [place] area along with her granddaughter, other daughter lives in [place] -> when she left [place], she first went to [place], but Subsequently Moved Back E. and Further S. to [place] to be closer to her granddaughter and daughter here locally.  Son also lives relatively close.      Caffeine: 2 servings daily-1 coffee, 1 herbal tea.      Diet: Minimizes red meat, eats lots of fruits and vegetables, chicken and fish.  Drinks lots of water (almost 80 ounces).      Exercise: Walks routinely 5 days a week, and started going to a dance class and exercise class.   Social Determinants of Health   Financial Resource Strain: Low Risk  (04/17/2023)   Overall Financial Resource Strain (CARDIA)    Difficulty of Paying Living Expenses: Not hard at all  Food Insecurity: No Food Insecurity (04/17/2023)   Hunger Vital Sign    Worried About Running Out of Food in the Last Year: Never true    Ran Out of Food in the Last Year: Never true  Transportation Needs: No Transportation Needs (04/17/2023)   PRAPARE - Transportation    Lack of  Transportation (Medical): No    Lack of  Transportation (Non-Medical): No  Physical Activity: Insufficiently Active (04/17/2023)   Exercise Vital Sign    Days of Exercise per Week: 3 days    Minutes of Exercise per Session: 20 min  Stress: No Stress Concern Present (04/17/2023)   [employer] of Occupational Health - Occupational Stress Questionnaire    Feeling of Stress : Only a little  Recent Concern: Stress - Stress Concern Present (04/04/2023)   [employer] of Occupational Health - Occupational Stress Questionnaire    Feeling of Stress : To some extent  Social Connections: Moderately Integrated (04/17/2023)   Social Connection and Isolation Panel [NHANES]    Frequency of Communication with Friends and Family: More than three times a week    Frequency of Social Gatherings with Friends and Family: Once a week    Attends Religious Services: More than 4 times per year    Active Member of [employer] or Organizations: Yes    Attends Banker Meetings: More than 4 times per year    Marital Status: Widowed  Intimate Partner Violence: Not At Risk (04/18/2023)   Humiliation, Afraid, Rape, and Kick questionnaire    Fear of Current or Ex-Partner: No    Emotionally Abused: No    Physically Abused: No    Sexually Abused: No     Review of Systems    General:  No chills, fever, night sweats or weight changes.  Cardiovascular:  No chest pain, dyspnea on exertion, edema, orthopnea, palpitations, paroxysmal nocturnal dyspnea. Dermatological: No rash, lesions/masses Respiratory: No cough, dyspnea Urologic: No hematuria, dysuria Abdominal:   No nausea, vomiting, diarrhea, bright red blood per rectum, melena, or hematemesis Neurologic:  No visual changes, wkns, changes in mental status. All other systems reviewed and are otherwise negative except as noted above.  Physical Exam    VS:  There were no vitals taken for this visit. , BMI There is no height or weight on file to calculate  BMI. GEN: Well nourished, well developed, in no acute distress. HEENT: normal. Neck: Supple, no JVD, carotid bruits, or masses. Cardiac: RRR, no murmurs, rubs, or gallops. No clubbing, cyanosis, edema.  Radials/DP/PT 2+ and equal bilaterally.  Respiratory:  Respirations regular and unlabored, clear to auscultation bilaterally. GI: Soft, nontender, nondistended, BS + x 4. MS: no deformity or atrophy. Skin: warm and dry, no rash. Neuro:  Strength and sensation are intact. Psych: Normal affect.  Accessory Clinical Findings    Recent Labs: 03/30/2023: ALT 23; BUN 11; Creatinine, Ser 0.80; Potassium 4.1; Sodium 141; TSH 0.55 05/11/2023: Hemoglobin 12.8; Platelet Count 156   Recent Lipid Panel No results found for: "CHOL", "TRIG", "HDL", "CHOLHDL", "VLDL", "LDLCALC", "LDLDIRECT"  No BP recorded.  {Refresh Note OR Click here to enter BP  :1}***    ECG personally reviewed by me today-none today.  sinus bradycardia with fusion complexes 59 bpm- No acute changes  EKG 06/20/2022  Normal sinus rhythm no ectopy  Assessment & Plan   1.  Essential hypertension-BP today 132/62*** Continue metoprolol, losartan Heart healthy low-sodium diet Increase physical activity as tolerated Maintain blood pressure log  Medication management-cardiac medications reviewed today.  Patient expressed understanding. Continue current medical therapy. Order BMP  Coronary artery disease-no chest pain today.  Denies recent episodes of chest pressure or tightness.  Somewhat physically active.  Previously noted to have EKG that showed poor R wave progression, also with preop EKG that showed T wave inversion in anterior leads.  EKG 06/20/2022 showed normal sinus rhythm.  Underwent cardiac catheterization  prior to moving to [place] area which showed mild nonobstructive disease, 50% D1 lesion 2021 in [place]. Continue aspirin, ezetimibe, metoprolol, rosuvastatin Heart healthy low-sodium diet-salty reviewed Increase  physical activity as tolerated  Dizzy-resolved.  Previously noted with changing head position.  Continue hydration Change positions slowly  Hyperlipidemia- compliant with statin therapy Continue rosuvastatin, aspirin Heart healthy low-sodium high-fiber diet Increase physical activity as tolerated Follows with PCP   Disposition: Follow-up with Dr. Herbie Baltimore or me in 9-12 months.   Thomasene Ripple. Erasmus Bistline NP-C     05/14/2023, 10:40 AM [facility] [address] Office [phone] Fax [phone]    I spent 14***minutes examining this patient, reviewing medications, and using patient centered shared decision making involving her cardiac care.  Prior to her visit I spent greater than 20 minutes reviewing her past medical history,  medications, and prior cardiac tests.

## 2023-05-15 ENCOUNTER — Ambulatory Visit: Payer: Medicare Other | Attending: General Practice | Admitting: General Practice

## 2023-05-15 ENCOUNTER — Encounter: Payer: Self-pay | Admitting: General Practice

## 2023-05-15 VITALS — BP 134/78 | HR 64 | Ht 66.0 in | Wt 144.2 lb

## 2023-05-15 DIAGNOSIS — I2510 Atherosclerotic heart disease of native coronary artery without angina pectoris: Secondary | ICD-10-CM

## 2023-05-15 DIAGNOSIS — Z79899 Other long term (current) drug therapy: Secondary | ICD-10-CM | POA: Diagnosis not present

## 2023-05-15 DIAGNOSIS — E785 Hyperlipidemia, unspecified: Secondary | ICD-10-CM

## 2023-05-15 DIAGNOSIS — R42 Dizziness and giddiness: Secondary | ICD-10-CM | POA: Diagnosis not present

## 2023-05-15 DIAGNOSIS — I10 Essential (primary) hypertension: Secondary | ICD-10-CM

## 2023-05-15 NOTE — Patient Instructions (Signed)
Medication Instructions:  The current medical regimen is effective;  continue present plan and medications as directed. Please refer to the Current Medication list given to you today.  *If you need a refill on your cardiac medications before your next appointment, please call your pharmacy*   Lab Work: NONE If you have labs (blood work) drawn today and your tests are completely normal, you will receive your results only by: MyChart Message (if you have MyChart) OR A paper copy in the mail If you have any lab test that is abnormal or we need to change your treatment, we will call you to review the results.   Testing/Procedures: NONE   Follow-Up: At [facility], you and your health needs are our priority.  As part of our continuing mission to provide you with exceptional heart care, we have created designated Provider Care Teams.  These Care Teams include your primary Cardiologist (physician) and Advanced Practice Providers (APPs -  Physician Assistants and Nurse Practitioners) who all work together to provide you with the care you need, when you need it.  Your next appointment:   12 month(s)  Provider:   Bryan Lemma, MD     Other Instructions INCREASE PHYSICAL ACTIVITY AS TOLERATED  CONTINUE TO TAKE AND LOG YOUR BLOOD PRESSURE

## 2023-05-17 ENCOUNTER — Ambulatory Visit: Payer: Medicare Other | Attending: Sports Medicine

## 2023-05-17 DIAGNOSIS — M6281 Muscle weakness (generalized): Secondary | ICD-10-CM

## 2023-05-17 DIAGNOSIS — M546 Pain in thoracic spine: Secondary | ICD-10-CM

## 2023-05-17 DIAGNOSIS — M25512 Pain in left shoulder: Secondary | ICD-10-CM

## 2023-05-17 DIAGNOSIS — R293 Abnormal posture: Secondary | ICD-10-CM

## 2023-05-17 DIAGNOSIS — R252 Cramp and spasm: Secondary | ICD-10-CM | POA: Diagnosis not present

## 2023-05-17 DIAGNOSIS — M542 Cervicalgia: Secondary | ICD-10-CM | POA: Diagnosis not present
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
## 2023-05-18 ENCOUNTER — Ambulatory Visit: Payer: Medicare Other | Admitting: Oncology

## 2023-05-18 ENCOUNTER — Other Ambulatory Visit: Payer: Medicare Other

## 2023-05-22 ENCOUNTER — Encounter: Payer: Self-pay | Admitting: Physical Therapy

## 2023-05-24 ENCOUNTER — Ambulatory Visit: Payer: Medicare Other

## 2023-05-25 ENCOUNTER — Ambulatory Visit (INDEPENDENT_AMBULATORY_CARE_PROVIDER_SITE_OTHER): Payer: Medicare Other | Admitting: Physician Assistant

## 2023-05-25 VITALS — BP 118/80 | HR 73 | Temp 97.4°F | Ht 66.0 in | Wt 144.6 lb

## 2023-05-25 DIAGNOSIS — S50312A Abrasion of left elbow, initial encounter: Secondary | ICD-10-CM | POA: Diagnosis not present

## 2023-05-25 DIAGNOSIS — W19XXXA Unspecified fall, initial encounter: Secondary | ICD-10-CM

## 2023-05-25 DIAGNOSIS — M5136 Other intervertebral disc degeneration, lumbar region: Secondary | ICD-10-CM | POA: Insufficient documentation

## 2023-05-25 DIAGNOSIS — T7840XA Allergy, unspecified, initial encounter: Secondary | ICD-10-CM | POA: Insufficient documentation

## 2023-05-25 NOTE — Patient Instructions (Signed)
Enjoy your family and best wishes in [place]! May God continue to bless you!

## 2023-05-25 NOTE — Progress Notes (Signed)
Subjective:    Patient ID: Caitlin Parks, female    DOB: Apr 20, 1946, 77 y.o.   MRN: [id number]  Chief Complaint  Patient presents with   Results    Pt in office to discuss recent test results with PCP; pt states she had a fall yesterday when getting up from toilet and sore today on left side and around neck and back area;     HPI Patient is in today for fall.   Using the commode yesterday, left leg fell asleep while sitting, put weight on R leg, and just fell to the floor. Hit left elbow. Back of head hit the shower. No LOC. Left back and neck are sore. Small abrasion on elbow. Tylenol for pain. ROM is good, doesn't feel like XRAY is necessary.  She had been driving 4 hours the day before and had lifted some luggage, not sure if she flared nerve pain or not.   Relocating to [place] next week where her family is located. Recently had visits with hematology and cardiology, everything looking ok. Will establish with new providers in [place], [place].   She is good on refills.   Past Medical History:  Diagnosis Date   Adenomatous colon polyp 2018   Allergy    SEASONAL   Breast cancer (HCC)    Breast cancer in female [facility]) 02/2013   Right-sided breast cancer-s/p right mastectomy.  No chemotherapy or radiation.   Essential hypertension    Family history of breast cancer    Family history of colon cancer    GERD (gastroesophageal reflux disease)    Hyperlipidemia LDL goal <100    Documented coronary calcification with mild CAD.   Leukopenia    Neutropenia (HCC)    Pneumonia    Seasonal allergies     Past Surgical History:  Procedure Laterality Date   AUGMENTATION MAMMAPLASTY     COLONOSCOPY     LAPAROSCOPIC RIGHT HEMI COLECTOMY Right 05/18/2022   Procedure: LAPAROSCOPIC RIGHT HEMI COLECTOMY;  Surgeon: Andria Meuse, MD;  Location: [facility];  Service: General;  Laterality: Right;   LEFT HEART CATH AND CORONARY ANGIOGRAPHY  07/14/2020   [place], [place], [place] : no  flow-limiting lesions.  Proximal LAD calcified lesion: FFR 0.95.  D1 to Strader bifurcation 50% stenosis.  Normal RCA and LCx.   MASTECTOMY Right 02/04/2013   NM MYOVIEW LTD  07/2020   Read as is "abnormal"-referred for cath that was normal.   PLACEMENT OF BREAST IMPLANTS Right 08/2013   POLYPECTOMY  05/18/2022   colon   TRANSTHORACIC ECHOCARDIOGRAM  2021   ([place], [place]) normal EF with no RWMA.   TUBAL LIGATION  1973   Reversed in 1981   Tubal ligation reversal  1981    Family History  Problem Relation Age of Onset   Colon cancer Mother 62   Cancer Maternal Uncle        NOS   Cancer Paternal Aunt        2 PAs with unknown cancer   Alzheimer's disease Maternal Grandmother 84   Lung cancer Maternal Grandfather    Colon cancer Paternal Grandmother    Cancer - Colon Paternal Grandmother    Diabetes Mellitus II Paternal Grandfather    Breast cancer Other        MGFs sister   Esophageal cancer Neg Hx    Rectal cancer Neg Hx    Stomach cancer Neg Hx     Social History   Tobacco Use   Smoking  status: Never    Passive exposure: Past   Smokeless tobacco: Never  Vaping Use   Vaping Use: Never used  Substance Use Topics   Alcohol use: Yes    Alcohol/week: 1.0 standard drink of alcohol    Types: 1 Glasses of wine per week    Comment: 2 GLASSES PER WEEK   Drug use: Never     Allergies  Allergen Reactions   E.E.S. [Erythromycin] Diarrhea   Vibramycin [Doxycycline] Rash and Other (See Comments)    Review of Systems NEGATIVE UNLESS OTHERWISE INDICATED IN HPI      Objective:     BP 118/80 (BP Location: Left Arm)   Pulse 73   Temp (!) 97.4 F (36.3 C) (Temporal)   Ht 5\' 6"  (1.676 m)   Wt 144 lb 9.6 oz (65.6 kg)   SpO2 99%   BMI 23.34 kg/m   Wt Readings from Last 3 Encounters:  05/25/23 144 lb 9.6 oz (65.6 kg)  05/15/23 144 lb 3.2 oz (65.4 kg)  05/11/23 144 lb 11.2 oz (65.6 kg)    BP Readings from Last 3 Encounters:  05/25/23 118/80  05/15/23  134/78  05/11/23 136/68     Physical Exam Vitals and nursing note reviewed.  Constitutional:      Appearance: Normal appearance.  Cardiovascular:     Rate and Rhythm: Normal rate and regular rhythm.  Pulmonary:     Effort: Pulmonary effort is normal.     Breath sounds: Normal breath sounds.  Musculoskeletal:        General: No swelling or tenderness. Normal range of motion.     Right lower leg: No edema.     Left lower leg: No edema.  Skin:    Findings: Lesion (small healed abrasion left lateral elbow) present.  Neurological:     General: No focal deficit present.     Mental Status: She is alert and oriented to person, place, and time.     Sensory: No sensory deficit.     Motor: No weakness.     Coordination: Coordination normal.     Gait: Gait normal.  Psychiatric:        Mood and Affect: Mood normal.        Behavior: Behavior normal.        Assessment & Plan:  Fall, initial encounter  Abrasion of left elbow, initial encounter   Accidental fall from commode on 05/24/23 - right leg seemed to give out on her. No LOC. No red flags on exam. Sore-feeling, but no discrete tender points. No indications for imaging today. Conservative care. Fall precautions discussed. She will establish with new provider in [place].     Return if symptoms worsen or fail to improve.     Verlinda Slotnick M Atiya Yera, PA-C

## 2023-05-30 ENCOUNTER — Ambulatory Visit: Payer: Medicare Other | Admitting: Physical Therapy

## 2023-05-31 ENCOUNTER — Ambulatory Visit: Payer: Medicare Other | Admitting: Cardiology

## 2023-06-02 ENCOUNTER — Ambulatory Visit: Payer: Medicare Other | Admitting: Physical Therapy

## 2023-06-12 ENCOUNTER — Ambulatory Visit: Payer: Medicare Other

## 2023-06-13 DIAGNOSIS — M542 Cervicalgia: Secondary | ICD-10-CM | POA: Diagnosis not present

## 2023-08-29 ENCOUNTER — Encounter

## 2023-09-19 ENCOUNTER — Inpatient Hospital Stay: Admit: 2023-09-19 | Payer: MEDICARE | Attending: Otolaryngology | Primary: Family Medicine

## 2023-09-19 DIAGNOSIS — H9042 Sensorineural hearing loss, unilateral, left ear, with unrestricted hearing on the contralateral side: Secondary | ICD-10-CM

## 2023-09-19 MED ORDER — GADOBUTROL 1 MMOL/ML IV SOLN
1 | Freq: Once | INTRAVENOUS | Status: AC | PRN
Start: 2023-09-19 — End: 2023-09-19
  Administered 2023-09-19: 14:00:00 7 mL via INTRAVENOUS

## 2023-10-06 ENCOUNTER — Encounter

## 2023-10-17 ENCOUNTER — Ambulatory Visit: Payer: MEDICARE | Primary: Hematology & Oncology

## 2023-10-17 DIAGNOSIS — R0602 Shortness of breath: Secondary | ICD-10-CM

## 2023-10-17 LAB — ECHOCARDIOGRAM COMPLETE 2D W DOPPLER W COLOR: Left Ventricular Ejection Fraction: 62

## 2023-10-17 NOTE — Progress Notes (Signed)
 Echocardiogram complete. Report to follow.

## 2023-10-26 NOTE — Progress Notes (Signed)
 PREOPERATIVE INSTRUCTIONS  Please Read Carefully    [x]  Your procedure is scheduled on: 10/27/23     [x]  The day before your surgery, call the surgeon's office to check on the surgery time and the time you should arrive.     [x]  On the day of yo

## 2023-10-27 ENCOUNTER — Inpatient Hospital Stay
Admit: 2023-10-27 | Discharge: 2023-10-27 | Disposition: A | Discharge status: Home / Self Care | Payer: Medicare (Managed Care) | Attending: Internal Medicine | Admitting: Internal Medicine

## 2023-10-27 DIAGNOSIS — Z1211 Encounter for screening for malignant neoplasm of colon: Secondary | ICD-10-CM

## 2023-10-27 MED ORDER — NORMAL SALINE FLUSH 0.9 % IV SOLN
0.9 | INTRAVENOUS | Status: DC | PRN
Start: 2023-10-27 — End: 2023-10-27

## 2023-10-27 MED ORDER — SODIUM CHLORIDE 0.9 % IV SOLN
0.9 | INTRAVENOUS | Status: DC | PRN
Start: 2023-10-27 — End: 2023-10-27

## 2023-10-27 MED ORDER — LACTATED RINGERS IV SOLN
INTRAVENOUS | Status: DC
Start: 2023-10-27 — End: 2023-10-27

## 2023-10-27 MED ORDER — LACTATED RINGERS IV SOLN
INTRAVENOUS | Status: DC
Start: 2023-10-27 — End: 2023-10-27
  Administered 2023-10-27: 15:00:00 via INTRAVENOUS

## 2023-10-27 MED ORDER — LIDOCAINE HCL (PF) 1 % IJ SOLN
1 | Freq: Once | INTRAMUSCULAR | Status: AC
Start: 2023-10-27 — End: 2023-10-27
  Administered 2023-10-27: 15:00:00 2 mL via INTRADERMAL

## 2023-10-27 MED ORDER — PROPOFOL 200 MG/20ML IV EMUL
200 | Freq: Once | INTRAVENOUS | Status: DC | PRN
Start: 2023-10-27 — End: 2023-10-27
  Administered 2023-10-27 (×2): 50 via INTRAVENOUS

## 2023-10-27 MED FILL — LACTATED RINGERS IV SOLN: INTRAVENOUS | Qty: 1000

## 2023-10-27 NOTE — Progress Notes (Signed)
 RIDE/GRANDSON      TAJ [phone]

## 2023-10-27 NOTE — Anesthesia Post-Procedure Evaluation (Signed)
 Department of Anesthesiology  Postprocedure Note    Patient: Kirsten Day  MRN: [id number]  Birthdate: 01/10/1946  Date of evaluation: 10/27/2023    Procedure Summary       Date: 10/27/23 Room / Location: [facility] ENDO 03 / [facility] ENDOSCOPY    Anesthesia Start

## 2023-10-27 NOTE — Discharge Instructions (Signed)
 COLONOSCOPY DISCHARGE INSTRUCTIONS    You have just had an examination of your colon (large intestine).  The medication given to you during your procedure will be acting in your body for the next 12 hours, gradually wearing off.  Your face may be flu

## 2023-10-27 NOTE — Anesthesia Pre-Procedure Evaluation (Addendum)
 Department of Anesthesiology  Preprocedure Note       Name:  Kirsten Day   Age:  77 y.o.  DOB:  [birth year]                                          MRN:  [id number]         Date:  10/27/2023      Surgeon: Moishe Spice):  Mendu, Reita Cliche, MD    Procedure: Demetrius Charity

## 2023-10-27 NOTE — H&P (Signed)
 History and Physical    Kirsten Day  06/24/1946  [id number]  [id number]    Pre-Procedure Diagnosis:  Polyp of colon, unspecified part of colon, unspecified type [K63.5]      Evaluation of past illnesses, surgeries, or injuries:   Yes  Past Medical

## 2023-11-16 ENCOUNTER — Ambulatory Visit: Payer: MEDICARE | Primary: Hematology & Oncology

## 2023-12-13 ENCOUNTER — Ambulatory Visit: Payer: MEDICARE | Primary: Hematology & Oncology

## 2024-01-25 ENCOUNTER — Encounter

## 2024-01-26 ENCOUNTER — Ambulatory Visit: Payer: MEDICARE | Primary: Hematology & Oncology

## 2024-02-02 ENCOUNTER — Ambulatory Visit: Payer: MEDICARE | Primary: Hematology & Oncology

## 2024-02-15 ENCOUNTER — Inpatient Hospital Stay: Admit: 2024-02-15 | Payer: MEDICARE | Primary: Hematology & Oncology

## 2024-02-15 ENCOUNTER — Ambulatory Visit
Admit: 2024-02-15 | Discharge: 2024-02-15 | Payer: MEDICARE | Attending: Cardiovascular Disease | Primary: Hematology & Oncology

## 2024-02-15 VITALS — BP 136/76 | HR 67 | Temp 96.90000°F | Ht 66.0 in | Wt 147.6 lb

## 2024-02-15 DIAGNOSIS — I2510 Atherosclerotic heart disease of native coronary artery without angina pectoris: Secondary | ICD-10-CM

## 2024-02-15 LAB — LIPID PANEL
Chol/HDL Ratio: 2.3 (ref 0–5.0)
Cholesterol, Total: 207 mg/dL — ABNORMAL HIGH (ref <200)
HDL: 90 mg/dL — ABNORMAL HIGH (ref 40–60)
LDL Cholesterol: 104.4 mg/dL — ABNORMAL HIGH (ref 0–100)
Triglycerides: 63 mg/dL (ref <150)
VLDL Cholesterol Calculated: 12.6 mg/dL

## 2024-02-15 LAB — C-REACTIVE PROTEIN: CRP: <0.3 mg/dL (ref 0–0.3)

## 2024-02-15 NOTE — Progress Notes (Signed)
 Kirsten Day (DOB:  [birth year]) is a 78 y.o. female,New patient, here for evaluation of the following chief complaint(s):  New Patient (Self referral-Basic HPTN)      Subjective   SUBJECTIVE/OBJECTIVE:    History of Present Illness  The patient is a 78 year old female who presents for evaluation of coronary artery disease, hypertension, hyperlipidemia, and dyspnea.    She has been on a regimen of medications following a diagnostic test conducted at [facility] 5 years ago, which revealed a delayed response in the blood flow to the left side of her heart. She has been under the care of [facility], with her most recent visit being less than a year ago. An echocardiogram was performed in 10/2023, and she is scheduled for both nuclear and regular stress tests. She reports occasional difficulty in breathing when lying flat, necessitating the use of three pillows during sleep. She also experiences intermittent headaches and sleep disturbances, which she attributes to stress. She maintains an active lifestyle, engaging in walking and cardio exercises via YouTube, without experiencing any shortness of breath. She is uncertain about her history of coronary blockages. She has initiated magnesium supplementation and was previously on isosorbide, which was discontinued due to insurance issues. She underwent a catheterization procedure within the last 5 years, during which she was informed of a slow blood flow. She monitors her blood pressure at home, which typically reads around 129. Her losartan dosage was increased to 50 mg following the discontinuation of isosorbide. She checks her blood pressure weekly and inquired about the possibility of reducing her medication dosage two months ago. She continues to take metoprolol twice daily. She is unsure when her cholesterol levels were last checked. She is not diabetic. Her last EKG was performed in 07/2023.    MEDICATIONS  Current: Zetia, losartan,  metoprolol, pravastatin  Discontinued: isosorbide    Past Medical History:   Diagnosis Date    Arthritis     CAD (coronary artery disease)     Cancer (HCC)     breast    GERD (gastroesophageal reflux disease)     Hyperlipidemia     Hypertension         Past Surgical History:   Procedure Laterality Date    COLON SURGERY  05/2022    COLONOSCOPY      COLONOSCOPY N/A 10/27/2023    SURVEILLANCE COLONOSCOPY -COLORECTAL CANCER SCREENING, HIGH RISK performed by Mendu, Reita Cliche, MD at [facility] ENDOSCOPY    MASTECTOMY Right 2014        Allergies   Allergen Reactions    Erythromycin Diarrhea    Doxycycline Other (See Comments) and Rash        Current Outpatient Medications   Medication Sig Dispense Refill    Ascorbic Acid 500 MG CAPS Take 500 mg by mouth every morning      calcium carbonate (OYSTER SHELL CALCIUM 500 MG) 1250 (500 Ca) MG tablet Take 1 tablet by mouth      ezetimibe (ZETIA) 10 MG tablet Take 1 tablet by mouth daily      fexofenadine (ALLEGRA) 180 MG tablet Take 1 tablet by mouth daily as needed      fluconazole (DIFLUCAN) 150 MG tablet       hydrOXYzine HCl (ATARAX) 10 MG tablet Take 1 tablet by mouth nightly as needed      Multiple Vitamin (DAILY VITES) TABS Take 1 tablet by mouth daily      Omega-3 Fatty Acids (FISH OIL) 1000 MG capsule Take  1 capsule by mouth daily      Probiotic Product (4X PROBIOTIC) TABS Take 1 capsule by mouth daily      rosuvastatin (CRESTOR) 10 MG tablet Take 1 tablet by mouth daily      Wheat Dextrin (BENEFIBER DRINK MIX) PACK Take 1 packet by mouth 2 times daily      losartan (COZAAR) 50 MG tablet       aspirin 81 MG EC tablet Take 1 tablet by mouth daily      azelastine (ASTELIN) 0.1 % nasal spray 1 spray by Nasal route 2 times daily as needed      Cholecalciferol 50 MCG (2000 UT) TABS Take 2 tablets by mouth daily      cyanocobalamin 100 MCG tablet Take 1 tablet by mouth daily      fexofenadine-pseudoephedrine (ALLEGRA-D ALLERGY & CONGESTION) 180-240 MG per extended release tablet Take 1  tablet by mouth daily as needed      metoprolol tartrate (LOPRESSOR) 25 MG tablet Take 0.5 tablets by mouth 2 times daily      pravastatin (PRAVACHOL) 40 MG tablet Take 1 tablet by mouth nightly      Ferrous Sulfate (IRON PO) Take by mouth (Patient not taking: Reported on 02/15/2024)      albuterol sulfate HFA (PROVENTIL;VENTOLIN;PROAIR) 108 (90 Base) MCG/ACT inhaler Inhale 1 puff into the lungs every 4 hours as needed (Patient not taking: Reported on 02/15/2024)      fluticasone (FLONASE) 50 MCG/ACT nasal spray 2 sprays by Nasal route daily (Patient not taking: Reported on 02/15/2024)      pantoprazole (PROTONIX) 20 MG tablet Take 1 tablet by mouth daily as needed (Patient not taking: Reported on 02/15/2024)       No current facility-administered medications for this visit.        Social History     Tobacco Use    Smoking status: Never     Passive exposure: Never    Smokeless tobacco: Never   Vaping Use    Vaping status: Never Used   Substance Use Topics    Alcohol use: Yes     Comment: occas    Drug use: Never        History reviewed. No pertinent family history.     Review of Systems   Constitutional: Negative.    HENT: Negative.     Eyes: Negative.    Respiratory:  Negative for shortness of breath.    Cardiovascular:  Negative for chest pain and palpitations.   Gastrointestinal: Negative.    Endocrine: Negative.    Genitourinary: Negative.    Allergic/Immunologic: Negative.    Neurological: Negative.    Hematological: Negative.    Psychiatric/Behavioral: Negative.         Physical Exam  The patient is a well-developed, thin individual, not in any acute distress.  Eyes show pupils that are equal, round, and reactive to light and accommodation. Extraocular movements are full. Sclera is clear.  Neck is supple. There is no jugular venous distention. No carotid bruits. Trachea is midline. No thyromegaly.  Lungs are clear to auscultation.  Heart sounds are normal S1 and S2. Regular rate and rhythm. There is a 6 systolic  murmur at the left lower sternal border.  Abdomen is soft, nondistended, nontender. Bowel sounds are present. No masses noted. No hepatomegaly or splenomegaly noted.  Extremities show no cyanosis, clubbing, or edema. Pulses are 1+ bilaterally in the upper and lower extremities.  Skin is otherwise clear.  Vital Signs  Blood pressure is 136 systolic.      ASSESSMENT/PLAN:   Assessment & Plan  1. Coronary Artery Disease.  She reports occasional difficulty breathing at night, requiring three pillows, and sometimes experiences headaches and poor sleep. She is currently on losartan 50 mg and metoprolol twice a day. She is also taking Zetia for cholesterol management. She is not diabetic. She is currently on pravastatin 40 mg and Zetia (ezetimibe). The last EKG was in August 2024. She is advised to monitor her blood pressure twice daily for three days and report the readings. An EKG will be performed today for comparison with previous results. A cholesterol level check and C-reactive protein test will be conducted to assess vascular inflammation and cardiac risk. The results of these tests will guide further treatment decisions.    2.  Hypertension  Fair to good control.  I have asked her to keep a check on blood pressures at home and present those findings I can decide if additional testing is ever warranted    3.  Systolic murmur  Likely flow murmur from hypertension    4.  Hypercholesterolemia  Patient is on pravastatin and Zetia this needs to be further evaluated with testing.  Have told her that risk factor modification is key to short and long-term management    PROCEDURE  The patient underwent a catheterization procedure within the last 5 years.    Results  Imaging  Echocardiogram done in 10/2023 showed good report.    1. Coronary artery disease involving native coronary artery of native heart without angina pectoris  -     EKG 12 Lead  -     Lipid Panel; Future  -     C-Reactive Protein; Future  2. Primary  hypertension  3. Systolic murmur  4. Hypercholesteremia  -     Lipid Panel; Future  5. Abnormal ECG    Results for orders placed or performed in visit on 02/15/24   EKG 12 Lead    Impression    Sinus bradycardia, left axis deviation, left atrial enlargement, low voltages in the precordial leads.  Poor R wave progression.  Nonspecific T wave abnormalities. RSR(V1) -nondiagnostic.    ABNORMAL  Marica Otter, MD        Return in about 1 year (around 02/14/2025) for follow up.    On this date 02/15/2024 I have spent 54 minutes reviewing previous notes, test results and face to face with the patient discussing the diagnosis and importance of compliance with the treatment plan as well as documenting on the day of the visit.    The patient (or guardian, if applicable) and other individuals in attendance with the patient were advised that Artificial Intelligence will be utilized during this visit to record, process the conversation to generate a clinical note and to support improvement of the AI technology. The patient (or guardian, if applicable) and other individuals in attendance at the appointment consented to the use of AI, including the recording.       An electronic signature was used to authenticate this note.    --Gurney Maxin, MD

## 2024-02-15 NOTE — Progress Notes (Signed)
 1. "Have you been to the ER, urgent care clinic since your last visit?  Hospitalized since your last visit?" Reviewed by Dr. Gurney Maxin     2. "Have you seen or consulted any other health care providers outside of the [facility] since your last visit?" Reviewed by Dr. Gurney Maxin

## 2024-04-10 LAB — HEMOGLOBIN A1C
Estimated Avg Glucose, External: 113 mg/dL (ref 91–123)
Hemoglobin A1C, External: 5.6 % (ref 4.8–5.6)

## 2024-04-24 ENCOUNTER — Encounter: Payer: Medicare (Managed Care) | Attending: Cardiovascular Disease | Primary: Hematology & Oncology

## 2024-04-25 ENCOUNTER — Ambulatory Visit: Payer: Medicare Other

## 2024-09-09 ENCOUNTER — Encounter

## 2024-12-12 ENCOUNTER — Inpatient Hospital Stay
Admit: 2024-12-12 | Payer: Medicare (Managed Care) | Attending: Student in an Organized Health Care Education/Training Program | Primary: Hematology & Oncology

## 2024-12-12 DIAGNOSIS — M85851 Other specified disorders of bone density and structure, right thigh: Principal | ICD-10-CM

## 2024-12-12 DIAGNOSIS — Z780 Asymptomatic menopausal state: Secondary | ICD-10-CM

## 2025-01-03 ENCOUNTER — Encounter: Payer: Self-pay | Admitting: Internal Medicine
